# Patient Record
Sex: Female | Born: 1990 | Race: White | Hispanic: No | Marital: Single | Smoking: Former smoker
Health system: Southern US, Community
[De-identification: ages and names within clinical notes are randomized; demographics above are authoritative.]

## PROBLEM LIST (undated history)

## (undated) ENCOUNTER — Ambulatory Visit

## (undated) ENCOUNTER — Encounter

## (undated) ENCOUNTER — Encounter: Attending: Psychiatric/Mental Health | Primary: Psychiatric/Mental Health

## (undated) ENCOUNTER — Telehealth: Payer: PRIVATE HEALTH INSURANCE

## (undated) ENCOUNTER — Telehealth

## (undated) ENCOUNTER — Ambulatory Visit
Payer: PRIVATE HEALTH INSURANCE | Attending: Student in an Organized Health Care Education/Training Program | Primary: Student in an Organized Health Care Education/Training Program

## (undated) ENCOUNTER — Ambulatory Visit: Payer: PRIVATE HEALTH INSURANCE

## (undated) ENCOUNTER — Encounter
Attending: Student in an Organized Health Care Education/Training Program | Primary: Student in an Organized Health Care Education/Training Program

## (undated) ENCOUNTER — Encounter: Payer: PRIVATE HEALTH INSURANCE | Attending: Psychiatric/Mental Health | Primary: Psychiatric/Mental Health

## (undated) ENCOUNTER — Encounter: Attending: School | Primary: School

## (undated) ENCOUNTER — Encounter: Attending: Mental Health | Primary: Mental Health

## (undated) ENCOUNTER — Encounter: Attending: Family | Primary: Family

## (undated) ENCOUNTER — Encounter: Attending: Pulmonary Disease | Primary: Pulmonary Disease

## (undated) ENCOUNTER — Telehealth: Attending: Psychiatric/Mental Health | Primary: Psychiatric/Mental Health

## (undated) ENCOUNTER — Telehealth: Attending: School | Primary: School

## (undated) ENCOUNTER — Encounter: Attending: Internal Medicine | Primary: Internal Medicine

## (undated) ENCOUNTER — Ambulatory Visit: Payer: Medicaid (Managed Care) | Attending: Psychiatric/Mental Health | Primary: Psychiatric/Mental Health

## (undated) ENCOUNTER — Ambulatory Visit: Attending: Pharmacist | Primary: Pharmacist

## (undated) ENCOUNTER — Telehealth: Attending: Internal Medicine | Primary: Internal Medicine

## (undated) ENCOUNTER — Ambulatory Visit: Payer: PRIVATE HEALTH INSURANCE | Attending: Psychiatric/Mental Health | Primary: Psychiatric/Mental Health

## (undated) ENCOUNTER — Telehealth: Attending: Children | Primary: Children

## (undated) ENCOUNTER — Ambulatory Visit: Payer: PRIVATE HEALTH INSURANCE | Attending: Clinical | Primary: Clinical

## (undated) ENCOUNTER — Telehealth: Attending: Mental Health | Primary: Mental Health

## (undated) ENCOUNTER — Ambulatory Visit: Payer: PRIVATE HEALTH INSURANCE | Attending: School | Primary: School

## (undated) ENCOUNTER — Telehealth
Attending: Student in an Organized Health Care Education/Training Program | Primary: Student in an Organized Health Care Education/Training Program

## (undated) ENCOUNTER — Ambulatory Visit: Attending: School | Primary: School

## (undated) ENCOUNTER — Ambulatory Visit: Payer: Medicaid (Managed Care) | Attending: School | Primary: School

## (undated) ENCOUNTER — Encounter: Attending: Clinical | Primary: Clinical

## (undated) ENCOUNTER — Encounter: Payer: Medicaid (Managed Care) | Attending: Psychiatric/Mental Health | Primary: Psychiatric/Mental Health

## (undated) ENCOUNTER — Ambulatory Visit: Payer: PRIVATE HEALTH INSURANCE | Attending: Family | Primary: Family

## (undated) ENCOUNTER — Inpatient Hospital Stay (HOSPITAL_COMMUNITY): Payer: Self-pay

## (undated) DIAGNOSIS — J45909 Unspecified asthma, uncomplicated: Secondary | ICD-10-CM

## (undated) DIAGNOSIS — F419 Anxiety disorder, unspecified: Secondary | ICD-10-CM

## (undated) DIAGNOSIS — M419 Scoliosis, unspecified: Secondary | ICD-10-CM

## (undated) DIAGNOSIS — N200 Calculus of kidney: Secondary | ICD-10-CM

## (undated) DIAGNOSIS — K219 Gastro-esophageal reflux disease without esophagitis: Secondary | ICD-10-CM

## (undated) DIAGNOSIS — Z62810 Personal history of physical and sexual abuse in childhood: Secondary | ICD-10-CM

## (undated) DIAGNOSIS — F329 Major depressive disorder, single episode, unspecified: Secondary | ICD-10-CM

## (undated) DIAGNOSIS — Z8619 Personal history of other infectious and parasitic diseases: Secondary | ICD-10-CM

## (undated) DIAGNOSIS — F32A Depression, unspecified: Secondary | ICD-10-CM

## (undated) DIAGNOSIS — F988 Other specified behavioral and emotional disorders with onset usually occurring in childhood and adolescence: Secondary | ICD-10-CM

## (undated) HISTORY — DX: Anxiety disorder, unspecified: F41.9

## (undated) HISTORY — DX: Depression, unspecified: F32.A

## (undated) HISTORY — PX: ADENOIDECTOMY: SUR15

## (undated) HISTORY — DX: Personal history of other infectious and parasitic diseases: Z86.19

## (undated) HISTORY — DX: Other specified behavioral and emotional disorders with onset usually occurring in childhood and adolescence: F98.8

## (undated) HISTORY — DX: Major depressive disorder, single episode, unspecified: F32.9

## (undated) HISTORY — DX: Gastro-esophageal reflux disease without esophagitis: K21.9

## (undated) HISTORY — DX: Personal history of physical and sexual abuse in childhood: Z62.810

## (undated) MED ORDER — FLUTICASONE PROPIONATE 220 MCG/ACTUATION HFA AEROSOL INHALER: Freq: Once | RESPIRATORY_TRACT | 0 days | PRN

## (undated) MED ORDER — PREDNISONE 10 MG TABLET: 0 days

---
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
## 2010-06-05 ENCOUNTER — Emergency Department (HOSPITAL_COMMUNITY)
Admission: EM | Admit: 2010-06-05 | Discharge: 2010-06-05 | Disposition: A | Discharge status: Home / Self Care | Payer: Self-pay | Attending: Emergency Medicine | Admitting: Emergency Medicine

## 2010-06-05 DIAGNOSIS — F3289 Other specified depressive episodes: Secondary | ICD-10-CM | POA: Insufficient documentation

## 2010-06-05 DIAGNOSIS — F909 Attention-deficit hyperactivity disorder, unspecified type: Secondary | ICD-10-CM | POA: Insufficient documentation

## 2010-06-05 DIAGNOSIS — R110 Nausea: Secondary | ICD-10-CM | POA: Insufficient documentation

## 2010-06-05 DIAGNOSIS — J45909 Unspecified asthma, uncomplicated: Secondary | ICD-10-CM | POA: Insufficient documentation

## 2010-06-05 DIAGNOSIS — T43605A Adverse effect of unspecified psychostimulants, initial encounter: Secondary | ICD-10-CM | POA: Insufficient documentation

## 2010-06-05 DIAGNOSIS — F329 Major depressive disorder, single episode, unspecified: Secondary | ICD-10-CM | POA: Insufficient documentation

## 2010-06-05 DIAGNOSIS — F29 Unspecified psychosis not due to a substance or known physiological condition: Secondary | ICD-10-CM | POA: Insufficient documentation

## 2010-06-05 DIAGNOSIS — R42 Dizziness and giddiness: Secondary | ICD-10-CM | POA: Insufficient documentation

## 2010-06-05 DIAGNOSIS — F411 Generalized anxiety disorder: Secondary | ICD-10-CM | POA: Insufficient documentation

## 2011-11-07 ENCOUNTER — Emergency Department: Payer: Self-pay | Admitting: Emergency Medicine

## 2012-03-31 ENCOUNTER — Encounter: Payer: Self-pay | Admitting: Obstetrics and Gynecology

## 2012-05-31 ENCOUNTER — Encounter (HOSPITAL_COMMUNITY): Payer: Self-pay | Admitting: *Deleted

## 2012-05-31 ENCOUNTER — Inpatient Hospital Stay (HOSPITAL_COMMUNITY)
Admission: AD | Admit: 2012-05-31 | Discharge: 2012-05-31 | Disposition: A | Discharge status: Home / Self Care | Payer: BC Managed Care – PPO | Source: Ambulatory Visit | Attending: Obstetrics & Gynecology | Admitting: Obstetrics & Gynecology

## 2012-05-31 DIAGNOSIS — O2390 Unspecified genitourinary tract infection in pregnancy, unspecified trimester: Secondary | ICD-10-CM

## 2012-05-31 DIAGNOSIS — O2343 Unspecified infection of urinary tract in pregnancy, third trimester: Secondary | ICD-10-CM

## 2012-05-31 DIAGNOSIS — N390 Urinary tract infection, site not specified: Secondary | ICD-10-CM | POA: Insufficient documentation

## 2012-05-31 DIAGNOSIS — R1031 Right lower quadrant pain: Secondary | ICD-10-CM | POA: Insufficient documentation

## 2012-05-31 HISTORY — DX: Scoliosis, unspecified: M41.9

## 2012-05-31 LAB — URINE MICROSCOPIC-ADD ON

## 2012-05-31 LAB — CBC WITH DIFFERENTIAL/PLATELET
Basophils Relative: 0 % (ref 0–1)
Eosinophils Relative: 4 % (ref 0–5)
HCT: 33.1 % — ABNORMAL LOW (ref 36.0–46.0)
Hemoglobin: 11.3 g/dL — ABNORMAL LOW (ref 12.0–15.0)
MCH: 31.2 pg (ref 26.0–34.0)
MCHC: 34.1 g/dL (ref 30.0–36.0)
MCV: 91.4 fL (ref 78.0–100.0)
Monocytes Absolute: 0.6 10*3/uL (ref 0.1–1.0)
Monocytes Relative: 6 % (ref 3–12)
Neutro Abs: 6.8 10*3/uL (ref 1.7–7.7)

## 2012-05-31 LAB — URINALYSIS, ROUTINE W REFLEX MICROSCOPIC
Ketones, ur: NEGATIVE mg/dL
Leukocytes, UA: NEGATIVE
Nitrite: POSITIVE — AB
Protein, ur: NEGATIVE mg/dL
Urobilinogen, UA: 0.2 mg/dL (ref 0.0–1.0)

## 2012-05-31 MED ORDER — MORPHINE SULFATE 4 MG/ML IJ SOLN
2.0000 mg | Freq: Once | INTRAMUSCULAR | Status: AC
  Administered 2012-05-31: 2 mg via INTRAMUSCULAR
  Filled 2012-05-31: qty 1

## 2012-05-31 MED ORDER — NITROFURANTOIN MONOHYD MACRO 100 MG PO CAPS: 100.0000 mg | ORAL_CAPSULE | Freq: Two times a day (BID) | ORAL | Status: DC

## 2012-05-31 NOTE — MAU Note (Signed)
PT presents with complaints of pain in her lower right side stating that it started this morning when she got out of bed. Denies any bleeding or LOF. States baby is active.

## 2012-05-31 NOTE — MAU Provider Note (Signed)
History     CSN: [id number]  Arrival date and time: 05/31/12 0865   None     Chief Complaint  Patient presents with  . pain right side    HPI Betty Webb is a 22 y.o. G1P0 female at [redacted]w[redacted]d who presents w/ report of RLQ pain that began this am when she got out of bed- states the pain brought her to her knees.  Reports pain as sharp and crampy. Pain worse with movement, raising legs, walking. Pain radiates down rt leg and into Rt lower back. Denies fever/chills, vomiting.  States the pain is making her nauseated.  Reports increased urinary frequency, dysuria. Denies urgency, hesitancy, hematuria. Last bm yesterday. Reports good fm. Denies uc's, lof, or vb. Concerned it could also be ovarian cyst- she has had in past.  Had anatomy u/s app 4wks ago and was normal.  [facility] in [place] for care but wants to deliver here- discussed need for either switching providers or delivering at [facility].  OB History   Grav Para Term Preterm Abortions TAB SAB Ect Mult Living   1               Past Medical History  Diagnosis Date  . Scoliosis     History reviewed. No pertinent past surgical history.  History reviewed. No pertinent family history.  History  Substance Use Topics  . Smoking status: Never Smoker   . Smokeless tobacco: Not on file  . Alcohol Use: No    Allergies: No Known Allergies  Prescriptions prior to admission  Medication Sig Dispense Refill  . acetaminophen (TYLENOL) 325 MG tablet Take 650 mg by mouth every 6 (six) hours as needed for pain (pain).      Marland Kitchen diphenhydrAMINE (BENADRYL) 25 mg capsule Take 25 mg by mouth every 6 (six) hours as needed for itching (allergies).        Review of Systems  Constitutional: Negative.  Negative for fever and chills.  Eyes: Negative.   Respiratory: Negative.   Cardiovascular: Negative.   Gastrointestinal: Positive for nausea (d/t pain) and abdominal pain (RLQ). Negative for vomiting, diarrhea and constipation.   Genitourinary: Positive for dysuria and frequency. Negative for urgency and hematuria. Flank pain: RLQ pain radiates to Rt lower back.  Musculoskeletal: Negative.   Skin: Negative.   Neurological: Positive for headaches.  Endo/Heme/Allergies: Negative.   Psychiatric/Behavioral: Negative.    Physical Exam   Blood pressure 113/81, pulse 85, temperature 97.6 F (36.4 C), temperature source Oral, resp. rate 18, last menstrual period 12/14/2011.  Physical Exam  Constitutional: She is oriented to person, place, and time. She appears well-developed and well-nourished.  HENT:  Head: Normocephalic.  Neck: Normal range of motion.  Cardiovascular: Normal rate and regular rhythm.   Respiratory: Effort normal and breath sounds normal.  GI: Soft. She exhibits no distension. There is tenderness (RLQ>suprapubic) in the right lower quadrant and suprapubic area. There is no rebound and no CVA tenderness.  gravid  Genitourinary:  SVE: LTC high  Musculoskeletal: Normal range of motion.  Neurological: She is alert and oriented to person, place, and time.  Skin: Skin is warm and dry.  Psychiatric: She has a normal mood and affect. Her behavior is normal. Judgment and thought content normal.    MAU Course  Procedures  EFM Exam UA, C&S CBC w/ diff Morphine 2mg  IM x 1  Discussed w/ Dr. Debroah Loop- OK to d/c home  Results for orders placed during the hospital encounter of 05/31/12 (from  the past 48 hour(s))  URINALYSIS, ROUTINE W REFLEX MICROSCOPIC     Status: Abnormal   Collection Time    05/31/12  8:10 AM      Result Value Range   Color, Urine YELLOW  YELLOW   APPearance CLEAR  CLEAR   Specific Gravity, Urine 1.020  1.005 - 1.030   pH 6.0  5.0 - 8.0   Glucose, UA NEGATIVE  NEGATIVE mg/dL   Hgb urine dipstick NEGATIVE  NEGATIVE   Bilirubin Urine NEGATIVE  NEGATIVE   Ketones, ur NEGATIVE  NEGATIVE mg/dL   Protein, ur NEGATIVE  NEGATIVE mg/dL   Urobilinogen, UA 0.2  0.0 - 1.0 mg/dL    Nitrite POSITIVE (*) NEGATIVE   Leukocytes, UA NEGATIVE  NEGATIVE  URINE MICROSCOPIC-ADD ON     Status: Abnormal   Collection Time    05/31/12  8:10 AM      Result Value Range   Squamous Epithelial / LPF MANY (*) RARE   WBC, UA 0-2  <3 WBC/hpf   Bacteria, UA MANY (*) RARE   Urine-Other MUCOUS PRESENT    CBC WITH DIFFERENTIAL     Status: Abnormal   Collection Time    05/31/12  9:19 AM      Result Value Range   WBC 9.5  4.0 - 10.5 K/uL   RBC 3.62 (*) 3.87 - 5.11 MIL/uL   Hemoglobin 11.3 (*) 12.0 - 15.0 g/dL   HCT 45.4 (*) 09.8 - 11.9 %   MCV 91.4  78.0 - 100.0 fL   MCH 31.2  26.0 - 34.0 pg   MCHC 34.1  30.0 - 36.0 g/dL   RDW 14.7  82.9 - 56.2 %   Platelets 223  150 - 400 K/uL   Neutrophils Relative 72  43 - 77 %   Neutro Abs 6.8  1.7 - 7.7 K/uL   Lymphocytes Relative 18  12 - 46 %   Lymphs Abs 1.7  0.7 - 4.0 K/uL   Monocytes Relative 6  3 - 12 %   Monocytes Absolute 0.6  0.1 - 1.0 K/uL   Eosinophils Relative 4  0 - 5 %   Eosinophils Absolute 0.3  0.0 - 0.7 K/uL   Basophils Relative 0  0 - 1 %   Basophils Absolute 0.0  0.0 - 0.1 K/uL   Assessment and Plan  A:  [redacted]w[redacted]d SIUP  UTI  Cat I FHR   P:  D/C home  Macrobid 100mg  po bid x 7d  Keep next pnv as scheduled- switch providers if planning on delivering here  List of providers given to pt  Return to hospital if symptoms worsen  Marge Duncans 05/31/2012, 8:55 AM

## 2012-05-31 NOTE — Progress Notes (Signed)
Genella Rife CNM notified of pt's arrival and complaints. States she will come and evaluate pt.

## 2012-06-23 ENCOUNTER — Encounter: Payer: Self-pay | Admitting: Obstetrics and Gynecology

## 2012-07-08 ENCOUNTER — Encounter: Payer: Self-pay | Admitting: Pediatric Cardiology

## 2012-08-12 ENCOUNTER — Encounter: Payer: Self-pay | Admitting: Pediatrics

## 2012-09-19 ENCOUNTER — Encounter (HOSPITAL_COMMUNITY): Payer: Self-pay | Admitting: *Deleted

## 2012-09-19 ENCOUNTER — Inpatient Hospital Stay (HOSPITAL_COMMUNITY)
Admission: AD | Admit: 2012-09-19 | Discharge: 2012-09-19 | Disposition: A | Discharge status: Home / Self Care | Payer: BC Managed Care – PPO | Source: Ambulatory Visit | Attending: Obstetrics and Gynecology | Admitting: Obstetrics and Gynecology

## 2012-09-19 DIAGNOSIS — F329 Major depressive disorder, single episode, unspecified: Secondary | ICD-10-CM | POA: Diagnosis not present

## 2012-09-19 DIAGNOSIS — O479 False labor, unspecified: Secondary | ICD-10-CM | POA: Insufficient documentation

## 2012-09-19 DIAGNOSIS — J45909 Unspecified asthma, uncomplicated: Secondary | ICD-10-CM | POA: Diagnosis not present

## 2012-09-19 DIAGNOSIS — O99891 Other specified diseases and conditions complicating pregnancy: Secondary | ICD-10-CM | POA: Insufficient documentation

## 2012-09-19 DIAGNOSIS — F419 Anxiety disorder, unspecified: Secondary | ICD-10-CM | POA: Diagnosis not present

## 2012-09-19 DIAGNOSIS — N200 Calculus of kidney: Secondary | ICD-10-CM | POA: Diagnosis not present

## 2012-09-19 HISTORY — DX: Unspecified asthma, uncomplicated: J45.909

## 2012-09-19 HISTORY — DX: Calculus of kidney: N20.0

## 2012-09-19 NOTE — MAU Provider Note (Signed)
History   22 yo G1P0 at 40 weeks presented after calling the office c/o ? Leaking for 1-2 days and irregular contractions.  Denies bleeding, reports +FM.  Cervix was 2 cm on exam this week.  BP in office 120/80.  Patient Active Problem List   Diagnosis Date Noted  . Kidney stone complicating pregnancy 09/19/2012  . Asthma--mild 09/19/2012  . Anxiety and depression 09/19/2012     Chief Complaint  Patient presents with  . Labor Eval  . Rupture of Membranes     OB History   Grav Para Term Preterm Abortions TAB SAB Ect Mult Living   1               Past Medical History  Diagnosis Date  . Scoliosis   . Asthma   . Kidney stone     Past Surgical History  Procedure Laterality Date  . Adenoidectomy      History reviewed. No pertinent family history.  History  Substance Use Topics  . Smoking status: Never Smoker   . Smokeless tobacco: Not on file  . Alcohol Use: No    Allergies: No Known Allergies  Prescriptions prior to admission  Medication Sig Dispense Refill  . alum & mag hydroxide-simeth (GELUSIL) 200-200-25 MG suspension Chew 2 tablets by mouth 2 (two) times daily as needed for indigestion.      . diphenhydrAMINE (BENADRYL) 25 mg capsule Take 25 mg by mouth every 6 (six) hours as needed for itching (allergies).      . Evening Primrose Oil 1000 MG CAPS Take 3 capsules by mouth daily. To help induce labor, soften cervix.      . hydrocortisone cream 1 % Apply 1 application topically 2 (two) times daily as needed (For rash.).         Physical Exam   Blood pressure 129/86, pulse 109, temperature 98.7 F (37.1 C), temperature source Oral, resp. rate 20, last menstrual period 12/14/2011.  Chest clear  Heart RRR without murmur Abd gravid, NT Pelvic--cervix 1-2 cm, 50%, vtx, -2, no leaking noted. Ext WNL  FHR Category 1 Occasional, mild UCs  Results for orders placed during the hospital encounter of 09/19/12 (from the past 24 hour(s))  AMNISURE RUPTURE OF  MEMBRANE (ROM)     Status: None   Collection Time    09/19/12  5:15 PM      Result Value Range   Amnisure ROM NEGATIVE       ED Course  IUP at 40 weeks No evidence SROM Will d/c home--labor s/s reviewed. PIH precautions reviewed. Keep scheduled office visit on Monday or call prn.   Nigel Bridgeman CNM, [place] 09/19/2012 5:55 PM

## 2012-09-19 NOTE — MAU Note (Signed)
uc's since last night, ? Leaking since yesterday, clear fluid.  Denies bleeding.  Also mucus discharge today.

## 2012-09-24 ENCOUNTER — Encounter (HOSPITAL_COMMUNITY): Payer: Self-pay | Admitting: *Deleted

## 2012-09-24 ENCOUNTER — Telehealth (HOSPITAL_COMMUNITY): Payer: Self-pay | Admitting: *Deleted

## 2012-09-24 NOTE — Telephone Encounter (Signed)
Preadmission screen  

## 2012-09-25 ENCOUNTER — Inpatient Hospital Stay (HOSPITAL_COMMUNITY): Payer: BC Managed Care – PPO | Admitting: Anesthesiology

## 2012-09-25 ENCOUNTER — Inpatient Hospital Stay (HOSPITAL_COMMUNITY)
Admission: RE | Admit: 2012-09-25 | Discharge: 2012-09-29 | DRG: 370 | Disposition: A | Discharge status: Home / Self Care | Payer: BC Managed Care – PPO | Source: Ambulatory Visit | Attending: Obstetrics and Gynecology | Admitting: Obstetrics and Gynecology

## 2012-09-25 ENCOUNTER — Encounter (HOSPITAL_COMMUNITY): Payer: Self-pay

## 2012-09-25 ENCOUNTER — Encounter (HOSPITAL_COMMUNITY): Payer: Self-pay | Admitting: Anesthesiology

## 2012-09-25 VITALS — BP 128/56 | HR 104 | Temp 98.7°F | Resp 19 | Ht 67.0 in | Wt 232.0 lb

## 2012-09-25 DIAGNOSIS — D649 Anemia, unspecified: Secondary | ICD-10-CM | POA: Diagnosis not present

## 2012-09-25 DIAGNOSIS — F419 Anxiety disorder, unspecified: Secondary | ICD-10-CM | POA: Diagnosis present

## 2012-09-25 DIAGNOSIS — O324XX0 Maternal care for high head at term, not applicable or unspecified: Secondary | ICD-10-CM | POA: Diagnosis present

## 2012-09-25 DIAGNOSIS — O359XX0 Maternal care for (suspected) fetal abnormality and damage, unspecified, not applicable or unspecified: Secondary | ICD-10-CM | POA: Diagnosis present

## 2012-09-25 DIAGNOSIS — O26899 Other specified pregnancy related conditions, unspecified trimester: Secondary | ICD-10-CM | POA: Diagnosis present

## 2012-09-25 DIAGNOSIS — O26849 Uterine size-date discrepancy, unspecified trimester: Secondary | ICD-10-CM | POA: Insufficient documentation

## 2012-09-25 DIAGNOSIS — O9903 Anemia complicating the puerperium: Secondary | ICD-10-CM | POA: Diagnosis not present

## 2012-09-25 DIAGNOSIS — O480 Post-term pregnancy: Secondary | ICD-10-CM | POA: Diagnosis present

## 2012-09-25 DIAGNOSIS — O358XX0 Maternal care for other (suspected) fetal abnormality and damage, not applicable or unspecified: Secondary | ICD-10-CM | POA: Diagnosis present

## 2012-09-25 DIAGNOSIS — Z98891 History of uterine scar from previous surgery: Secondary | ICD-10-CM

## 2012-09-25 DIAGNOSIS — R21 Rash and other nonspecific skin eruption: Secondary | ICD-10-CM | POA: Diagnosis present

## 2012-09-25 DIAGNOSIS — L299 Pruritus, unspecified: Secondary | ICD-10-CM | POA: Diagnosis present

## 2012-09-25 LAB — OB RESULTS CONSOLE GC/CHLAMYDIA
Chlamydia: NEGATIVE
Gonorrhea: NEGATIVE

## 2012-09-25 LAB — COMPREHENSIVE METABOLIC PANEL
ALT: 11 U/L (ref 0–35)
Albumin: 2.7 g/dL — ABNORMAL LOW (ref 3.5–5.2)
Alkaline Phosphatase: 178 U/L — ABNORMAL HIGH (ref 39–117)
Potassium: 4.2 mEq/L (ref 3.5–5.1)
Sodium: 135 mEq/L (ref 135–145)
Total Protein: 6.4 g/dL (ref 6.0–8.3)

## 2012-09-25 LAB — PROTEIN / CREATININE RATIO, URINE
Protein Creatinine Ratio: 0.21 — ABNORMAL HIGH (ref 0.00–0.15)
Total Protein, Urine: 69.8 mg/dL

## 2012-09-25 LAB — CBC
HCT: 33 % — ABNORMAL LOW (ref 36.0–46.0)
Platelets: 254 10*3/uL (ref 150–400)
RBC: 3.92 MIL/uL (ref 3.87–5.11)
RDW: 14 % (ref 11.5–15.5)
WBC: 9.2 10*3/uL (ref 4.0–10.5)

## 2012-09-25 LAB — OB RESULTS CONSOLE ABO/RH: "RH Type ": POSITIVE

## 2012-09-25 LAB — ABO/RH: ABO/RH(D): A POS

## 2012-09-25 LAB — OB RESULTS CONSOLE ANTIBODY SCREEN: Antibody Screen: NEGATIVE

## 2012-09-25 LAB — TYPE AND SCREEN: ABO/RH(D): A POS

## 2012-09-25 MED ORDER — TERBUTALINE SULFATE 1 MG/ML IJ SOLN: 0.2500 mg | Freq: Once | INTRAMUSCULAR | Status: AC | PRN

## 2012-09-25 MED ORDER — LIDOCAINE HCL (PF) 1 % IJ SOLN
30.0000 mL | INTRAMUSCULAR | Status: DC | PRN
  Filled 2012-09-25: qty 30

## 2012-09-25 MED ORDER — OXYTOCIN 40 UNITS IN LACTATED RINGERS INFUSION - SIMPLE MED
1.0000 m[IU]/min | INTRAVENOUS | Status: DC
  Administered 2012-09-25: 2 m[IU]/min via INTRAVENOUS

## 2012-09-25 MED ORDER — ACETAMINOPHEN 325 MG PO TABS: 650.0000 mg | ORAL_TABLET | ORAL | Status: DC | PRN

## 2012-09-25 MED ORDER — FENTANYL 2.5 MCG/ML BUPIVACAINE 1/10 % EPIDURAL INFUSION (WH - ANES)
INTRAMUSCULAR | Status: DC | PRN
  Administered 2012-09-25: 14 mL/h via EPIDURAL

## 2012-09-25 MED ORDER — LIDOCAINE HCL (PF) 1 % IJ SOLN
INTRAMUSCULAR | Status: DC | PRN
  Administered 2012-09-25 (×2): 4 mL

## 2012-09-25 MED ORDER — IBUPROFEN 600 MG PO TABS: 600.0000 mg | ORAL_TABLET | Freq: Four times a day (QID) | ORAL | Status: DC | PRN

## 2012-09-25 MED ORDER — LACTATED RINGERS IV SOLN: 500.0000 mL | INTRAVENOUS | Status: DC | PRN

## 2012-09-25 MED ORDER — EPHEDRINE 5 MG/ML INJ
10.0000 mg | INTRAVENOUS | Status: DC | PRN
  Filled 2012-09-25: qty 4

## 2012-09-25 MED ORDER — OXYCODONE-ACETAMINOPHEN 5-325 MG PO TABS: 1.0000 | ORAL_TABLET | ORAL | Status: DC | PRN

## 2012-09-25 MED ORDER — FENTANYL CITRATE 0.05 MG/ML IJ SOLN
100.0000 ug | INTRAMUSCULAR | Status: DC | PRN
  Administered 2012-09-25 (×4): 100 ug via INTRAVENOUS
  Filled 2012-09-25 (×4): qty 2

## 2012-09-25 MED ORDER — PHENYLEPHRINE 40 MCG/ML (10ML) SYRINGE FOR IV PUSH (FOR BLOOD PRESSURE SUPPORT): 80.0000 ug | PREFILLED_SYRINGE | INTRAVENOUS | Status: DC | PRN

## 2012-09-25 MED ORDER — CITRIC ACID-SODIUM CITRATE 334-500 MG/5ML PO SOLN
30.0000 mL | ORAL | Status: DC | PRN
  Administered 2012-09-26: 30 mL via ORAL
  Filled 2012-09-25: qty 15

## 2012-09-25 MED ORDER — FENTANYL 2.5 MCG/ML BUPIVACAINE 1/10 % EPIDURAL INFUSION (WH - ANES)
14.0000 mL/h | INTRAMUSCULAR | Status: DC | PRN
  Administered 2012-09-26: 14 mL/h via EPIDURAL
  Filled 2012-09-25 (×2): qty 125

## 2012-09-25 MED ORDER — HYDROXYZINE HCL 25 MG PO TABS
25.0000 mg | ORAL_TABLET | Freq: Three times a day (TID) | ORAL | Status: DC | PRN
  Filled 2012-09-25: qty 1

## 2012-09-25 MED ORDER — PHENYLEPHRINE 40 MCG/ML (10ML) SYRINGE FOR IV PUSH (FOR BLOOD PRESSURE SUPPORT)
80.0000 ug | PREFILLED_SYRINGE | INTRAVENOUS | Status: DC | PRN
  Filled 2012-09-25: qty 5

## 2012-09-25 MED ORDER — LACTATED RINGERS IV SOLN
500.0000 mL | Freq: Once | INTRAVENOUS | Status: AC
  Administered 2012-09-25: 500 mL via INTRAVENOUS

## 2012-09-25 MED ORDER — OXYTOCIN 40 UNITS IN LACTATED RINGERS INFUSION - SIMPLE MED
62.5000 mL/h | INTRAVENOUS | Status: DC
  Filled 2012-09-25: qty 1000

## 2012-09-25 MED ORDER — DIPHENHYDRAMINE HCL 50 MG/ML IJ SOLN: 12.5000 mg | INTRAMUSCULAR | Status: DC | PRN

## 2012-09-25 MED ORDER — ONDANSETRON HCL 4 MG/2ML IJ SOLN
4.0000 mg | Freq: Four times a day (QID) | INTRAMUSCULAR | Status: DC | PRN
  Administered 2012-09-26: 4 mg via INTRAVENOUS
  Filled 2012-09-25: qty 2

## 2012-09-25 MED ORDER — EPHEDRINE 5 MG/ML INJ: 10.0000 mg | INTRAVENOUS | Status: DC | PRN

## 2012-09-25 MED ORDER — OXYTOCIN BOLUS FROM INFUSION: 500.0000 mL | INTRAVENOUS | Status: DC

## 2012-09-25 MED ORDER — DIPHENHYDRAMINE-ZINC ACETATE 2-0.1 % EX CREA
TOPICAL_CREAM | Freq: Every day | CUTANEOUS | Status: DC | PRN
  Administered 2012-09-25: 10:00:00 via TOPICAL
  Filled 2012-09-25: qty 28

## 2012-09-25 MED ORDER — LACTATED RINGERS IV SOLN
INTRAVENOUS | Status: DC
  Administered 2012-09-25 – 2012-09-26 (×4): via INTRAVENOUS

## 2012-09-25 NOTE — Anesthesia Procedure Notes (Signed)
Epidural Patient location during procedure: OB Start time: 09/25/2012 7:48 PM  Staffing Anesthesiologist: Richar Dunklee A. Performed by: anesthesiologist   Preanesthetic Checklist Completed: patient identified, site marked, surgical consent, pre-op evaluation, timeout performed, IV checked, risks and benefits discussed and monitors and equipment checked  Epidural Patient position: sitting Prep: site prepped and draped and DuraPrep Patient monitoring: continuous pulse ox and blood pressure Approach: midline Injection technique: LOR air  Needle:  Needle type: Tuohy  Needle gauge: 17 G Needle length: 9 cm and 9 Needle insertion depth: 5 cm cm Catheter type: closed end flexible Catheter size: 19 Gauge Catheter at skin depth: 10 cm Test dose: negative and Other  Assessment Events: blood not aspirated, injection not painful, no injection resistance, negative IV test and no paresthesia  Additional Notes Patient identified. Risks and benefits discussed including failed block, incomplete  Pain control, post dural puncture headache, nerve damage, paralysis, blood pressure Changes, nausea, vomiting, reactions to medications-both toxic and allergic and post Partum back pain. All questions were answered. Patient expressed understanding and wished to proceed. Sterile technique was used throughout procedure. Epidural site was Dressed with sterile barrier dressing. No paresthesias, signs of intravascular injection Or signs of intrathecal spread were encountered.  Patient was more comfortable after the epidural was dosed. Please see RN's note for documentation of vital signs and FHR which are stable.

## 2012-09-25 NOTE — Progress Notes (Signed)
  Subjective: Pt sitting up in bed with friends and family at bedside. Pt reports increased intensity of UCs.  Pt declined Vistaril and asked for IV pain medication for the UCs instead. Pt was given Fentanyl instead.  She is hoping to wait as long as possible before getting an epidural.  Objective: BP 127/75  Pulse 89  Temp(Src) 97.8 F (36.6 C) (Oral)  Resp 22  Ht 5\' 7"  (1.702 m)  Wt 232 lb (105.235 kg)  BMI 36.33 kg/m2  LMP 12/14/2011      FHT:  Cat I UC:   regular, every 1-4 minutes  SVE:   Dilation: 3 Effacement (%): 70 Station: -2 Exam by:: J Dazhane Villagomez CNM  Assessment / Plan:  Labor: IOL for PDs, Pitocin on 12 milliU Preeclampsia: no s/s; 1x elevated BP will cto. Fetal Wellbeing: Cat I Pain Control: Fentanyl 100 mcg Q 1 hr I/D: GBS neg; intact; afibril Anticipated MOD: SVD   Betty Webb 09/25/2012, 3:17 PM

## 2012-09-25 NOTE — Progress Notes (Signed)
  Subjective: Pt is c/o intense itching from abdominal rash, may be PUPPS.  Pt is reporting feeling UCs.  Objective: BP 131/95  Pulse 89  Temp(Src) 97.8 F (36.6 C) (Oral)  Resp 17  Ht 5\' 7"  (1.702 m)  Wt 232 lb (105.235 kg)  BMI 36.33 kg/m2  LMP 12/14/2011      FHT:  Cat I UC:   regular, every 1-4 minutes   Assessment / Plan:  Vistaril 25 mg TID PRN for ithcing ordred  Danessa Mensch 09/25/2012, 1:00 PM

## 2012-09-25 NOTE — Anesthesia Preprocedure Evaluation (Addendum)
Anesthesia Evaluation    Airway Mallampati: III TM Distance: >3 FB Neck ROM: Full    Dental no notable dental hx. (+) Teeth Intact   Pulmonary asthma , former smoker,  breath sounds clear to auscultation  Pulmonary exam normal       Cardiovascular negative cardio ROS  Rhythm:Regular Rate:Normal     Neuro/Psych PSYCHIATRIC DISORDERS Anxiety Depression    GI/Hepatic Neg liver ROS, GERD-  Medicated and Controlled,  Endo/Other  Obesity  Renal/GU Renal diseaseHx/o Renal Calculus during pregnancy  negative genitourinary   Musculoskeletal   Abdominal (+) + obese,   Peds  Hematology negative hematology ROS (+)   Anesthesia Other Findings   Reproductive/Obstetrics                           Anesthesia Physical Anesthesia Plan  ASA: II and emergent  Anesthesia Plan: Epidural   Post-op Pain Management:    Induction:   Airway Management Planned: Natural Airway  Additional Equipment:   Intra-op Plan:   Post-operative Plan:   Informed Consent: I have reviewed the patients History and Physical, chart, labs and discussed the procedure including the risks, benefits and alternatives for the proposed anesthesia with the patient or authorized representative who has indicated his/her understanding and acceptance.   Dental advisory given  Plan Discussed with: Anesthesiologist  Anesthesia Plan Comments:        Anesthesia Quick Evaluation

## 2012-09-25 NOTE — Progress Notes (Addendum)
Patient ID: Betty Webb, female   DOB: 08-20-1990, 22 y.o.   MRN: [id number] Betty Webb is a 22 y.o. G1P0000 at [redacted]w[redacted]d admitted for IOL for PD  Subjective: Comfortable w epidural, somewhat anxious overall, family supportive at [facility]  Objective: BP 134/89  Pulse 92  Temp(Src) 98.2 F (36.8 C) (Oral)  Resp 18  Ht 5\' 7"  (1.702 m)  Wt 232 lb (105.235 kg)  BMI 36.33 kg/m2  SpO2 99%  LMP 12/14/2011     FHT:  FHR: 120 bpm, variability: moderate,  accelerations:  Present,  decelerations:  Absent UC:   regular, every 1-2  minutes SVE:   Dilation: 8 Effacement (%): 100 Station: 0 Exam by:: lilliard,cnm    Assessment / Plan: Induction of labor due to postterm,  progressing well on pitocin  Labor: Progressing normally Preeclampsia:  no s/s Fetal Wellbeing:  Category I Pain Control:  Epidural Anticipated MOD:  NSVD  Recheck 2hrs or w urge to push  Per pt, did have glucola and results were normal  Update physician PRN   Malissa Hippo 09/25/2012, 24:19 PM

## 2012-09-25 NOTE — Progress Notes (Signed)
Patient ID: Betty Webb, female   DOB: 04-May-1990, 22 y.o.   MRN: [id number] Betty Webb is a 22 y.o. G1P0000 at [redacted]w[redacted]d admitted for IOL for PD  Subjective: Comfortable w epidural, family supportive at [facility]  Objective: BP 132/84  Pulse 89  Temp(Src) 98.2 F (36.8 C) (Oral)  Resp 18  Ht 5\' 7"  (1.702 m)  Wt 232 lb (105.235 kg)  BMI 36.33 kg/m2  SpO2 99%  LMP 12/14/2011     Filed Vitals:   09/25/12 2023 09/25/12 2033 09/25/12 2102 09/25/12 2132  BP:  136/78 135/87 132/84  Pulse: 87 93 83 89  Temp:  98.2 F (36.8 C)    TempSrc:  Oral    Resp:  20  18  Height:      Weight:      SpO2:         FHT:  FHR: 130 bpm, variability: moderate,  accelerations:  Present,  decelerations:  Absent UC:   regular, every 1-3 minutes SVE:   Dilation: 4.5 Effacement (%): 90 Station: -1 Exam by:: foley,rn  SROM clear fluid w exam by RN at about 815pm Vag deferred at present   Assessment / Plan: Induction of labor due to postterm,  progressing well on pitocin  Labor: Progressing normally, pitocin at 19mu, continue titration prn  Preeclampsia:  BP stable Fetal Wellbeing:  Category I Pain Control:  Epidural Anticipated MOD:  NSVD  Unknown result for 1hr gtt, will confirm w pt, if not done, consider checking CBG's  GBS neg Recheck 1hr, consider IUPC  Dr Pennie Rushing updated   Betty Webb 09/25/2012, 9:57 PM
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
## 2012-09-26 ENCOUNTER — Encounter (HOSPITAL_COMMUNITY)
Admission: RE | Disposition: A | Discharge status: Home / Self Care | Payer: Self-pay | Source: Ambulatory Visit | Attending: Obstetrics and Gynecology

## 2012-09-26 ENCOUNTER — Encounter (HOSPITAL_COMMUNITY): Payer: Self-pay

## 2012-09-26 SURGERY — Surgical Case
Anesthesia: Epidural | Wound class: Clean Contaminated

## 2012-09-26 MED ORDER — METHYLERGONOVINE MALEATE 0.2 MG/ML IJ SOLN: 0.2000 mg | INTRAMUSCULAR | Status: DC | PRN

## 2012-09-26 MED ORDER — SENNOSIDES-DOCUSATE SODIUM 8.6-50 MG PO TABS
2.0000 | ORAL_TABLET | Freq: Every day | ORAL | Status: DC
  Administered 2012-09-26 – 2012-09-28 (×3): 2 via ORAL

## 2012-09-26 MED ORDER — SODIUM BICARBONATE 8.4 % IV SOLN
INTRAVENOUS | Status: DC | PRN
  Administered 2012-09-26 (×5): 5 mL via EPIDURAL

## 2012-09-26 MED ORDER — MEPERIDINE HCL 25 MG/ML IJ SOLN
INTRAMUSCULAR | Status: AC
  Filled 2012-09-26: qty 1

## 2012-09-26 MED ORDER — ONDANSETRON HCL 4 MG/2ML IJ SOLN: 4.0000 mg | INTRAMUSCULAR | Status: DC | PRN

## 2012-09-26 MED ORDER — METHYLERGONOVINE MALEATE 0.2 MG PO TABS: 0.2000 mg | ORAL_TABLET | ORAL | Status: DC | PRN

## 2012-09-26 MED ORDER — MEASLES, MUMPS & RUBELLA VAC ~~LOC~~ INJ
0.5000 mL | INJECTION | Freq: Once | SUBCUTANEOUS | Status: DC
  Filled 2012-09-26: qty 0.5

## 2012-09-26 MED ORDER — 0.9 % SODIUM CHLORIDE (POUR BTL) OPTIME
TOPICAL | Status: DC | PRN
  Administered 2012-09-26: 1000 mL

## 2012-09-26 MED ORDER — OXYTOCIN 40 UNITS IN LACTATED RINGERS INFUSION - SIMPLE MED: 62.5000 mL/h | INTRAVENOUS | Status: AC

## 2012-09-26 MED ORDER — MEPERIDINE HCL 25 MG/ML IJ SOLN
INTRAMUSCULAR | Status: DC | PRN
  Administered 2012-09-26 (×2): 12.5 mg via INTRAVENOUS

## 2012-09-26 MED ORDER — MORPHINE SULFATE 0.5 MG/ML IJ SOLN
INTRAMUSCULAR | Status: AC
  Filled 2012-09-26: qty 10

## 2012-09-26 MED ORDER — DIPHENHYDRAMINE HCL 25 MG PO CAPS: 25.0000 mg | ORAL_CAPSULE | Freq: Four times a day (QID) | ORAL | Status: DC | PRN

## 2012-09-26 MED ORDER — DIPHENHYDRAMINE HCL 25 MG PO CAPS
25.0000 mg | ORAL_CAPSULE | ORAL | Status: DC | PRN
  Administered 2012-09-27 – 2012-09-28 (×3): 25 mg via ORAL
  Filled 2012-09-26 (×4): qty 1

## 2012-09-26 MED ORDER — METOCLOPRAMIDE HCL 5 MG/ML IJ SOLN: 10.0000 mg | Freq: Three times a day (TID) | INTRAMUSCULAR | Status: DC | PRN

## 2012-09-26 MED ORDER — MENTHOL 3 MG MT LOZG: 1.0000 | LOZENGE | OROMUCOSAL | Status: DC | PRN

## 2012-09-26 MED ORDER — BUPIVACAINE HCL (PF) 0.25 % IJ SOLN
INTRAMUSCULAR | Status: AC
  Filled 2012-09-26: qty 30

## 2012-09-26 MED ORDER — KETOROLAC TROMETHAMINE 30 MG/ML IJ SOLN
30.0000 mg | Freq: Four times a day (QID) | INTRAMUSCULAR | Status: AC | PRN
  Administered 2012-09-26: 30 mg via INTRAVENOUS
  Filled 2012-09-26: qty 1

## 2012-09-26 MED ORDER — OXYTOCIN 10 UNIT/ML IJ SOLN
40.0000 [IU] | INTRAVENOUS | Status: DC | PRN
  Administered 2012-09-26: 40 [IU] via INTRAVENOUS

## 2012-09-26 MED ORDER — MEPERIDINE HCL 25 MG/ML IJ SOLN: 6.2500 mg | INTRAMUSCULAR | Status: DC | PRN

## 2012-09-26 MED ORDER — OXYTOCIN 10 UNIT/ML IJ SOLN
INTRAMUSCULAR | Status: AC
  Filled 2012-09-26: qty 4

## 2012-09-26 MED ORDER — SIMETHICONE 80 MG PO CHEW
80.0000 mg | CHEWABLE_TABLET | Freq: Three times a day (TID) | ORAL | Status: DC
  Administered 2012-09-27 – 2012-09-29 (×9): 80 mg via ORAL

## 2012-09-26 MED ORDER — IBUPROFEN 600 MG PO TABS
600.0000 mg | ORAL_TABLET | Freq: Four times a day (QID) | ORAL | Status: DC
  Administered 2012-09-27 – 2012-09-29 (×11): 600 mg via ORAL
  Filled 2012-09-26 (×11): qty 1

## 2012-09-26 MED ORDER — FENTANYL CITRATE 0.05 MG/ML IJ SOLN
INTRAMUSCULAR | Status: AC
Start: 2012-09-26 — End: 2012-09-26
  Filled 2012-09-26: qty 2

## 2012-09-26 MED ORDER — DIBUCAINE 1 % RE OINT: 1.0000 "application " | TOPICAL_OINTMENT | RECTAL | Status: DC | PRN

## 2012-09-26 MED ORDER — BUPIVACAINE HCL (PF) 0.25 % IJ SOLN
INTRAMUSCULAR | Status: DC | PRN
  Administered 2012-09-26: 20 mL

## 2012-09-26 MED ORDER — DIPHENHYDRAMINE HCL 50 MG/ML IJ SOLN: 25.0000 mg | INTRAMUSCULAR | Status: DC | PRN

## 2012-09-26 MED ORDER — NALBUPHINE SYRINGE 5 MG/0.5 ML
5.0000 mg | INJECTION | INTRAMUSCULAR | Status: DC | PRN
  Filled 2012-09-26 (×2): qty 1

## 2012-09-26 MED ORDER — TETANUS-DIPHTH-ACELL PERTUSSIS 5-2.5-18.5 LF-MCG/0.5 IM SUSP: 0.5000 mL | Freq: Once | INTRAMUSCULAR | Status: DC

## 2012-09-26 MED ORDER — ACETAMINOPHEN 160 MG/5ML PO SOLN
650.0000 mg | Freq: Once | ORAL | Status: AC
  Administered 2012-09-26: 650 mg via ORAL
  Filled 2012-09-26: qty 20.3

## 2012-09-26 MED ORDER — FENTANYL CITRATE 0.05 MG/ML IJ SOLN
25.0000 ug | INTRAMUSCULAR | Status: DC | PRN
  Administered 2012-09-26 (×2): 25 ug via INTRAVENOUS

## 2012-09-26 MED ORDER — PRENATAL MULTIVITAMIN CH
1.0000 | ORAL_TABLET | Freq: Every day | ORAL | Status: DC
  Administered 2012-09-27 – 2012-09-29 (×3): 1 via ORAL
  Filled 2012-09-26 (×3): qty 1

## 2012-09-26 MED ORDER — SIMETHICONE 80 MG PO CHEW: 80.0000 mg | CHEWABLE_TABLET | ORAL | Status: DC | PRN

## 2012-09-26 MED ORDER — NALOXONE HCL 1 MG/ML IJ SOLN
1.0000 ug/kg/h | INTRAVENOUS | Status: DC | PRN
  Filled 2012-09-26: qty 2

## 2012-09-26 MED ORDER — LACTATED RINGERS IV SOLN
INTRAVENOUS | Status: DC | PRN
  Administered 2012-09-26 (×2): via INTRAVENOUS

## 2012-09-26 MED ORDER — ONDANSETRON HCL 4 MG PO TABS: 4.0000 mg | ORAL_TABLET | ORAL | Status: DC | PRN

## 2012-09-26 MED ORDER — MORPHINE SULFATE (PF) 0.5 MG/ML IJ SOLN
INTRAMUSCULAR | Status: DC | PRN
  Administered 2012-09-26: 3 mg via EPIDURAL

## 2012-09-26 MED ORDER — FENTANYL CITRATE 0.05 MG/ML IJ SOLN
INTRAMUSCULAR | Status: DC | PRN
  Administered 2012-09-26 (×2): 50 ug via INTRAVENOUS

## 2012-09-26 MED ORDER — KETOROLAC TROMETHAMINE 30 MG/ML IJ SOLN
30.0000 mg | Freq: Four times a day (QID) | INTRAMUSCULAR | Status: AC | PRN
  Administered 2012-09-26: 30 mg via INTRAMUSCULAR

## 2012-09-26 MED ORDER — ONDANSETRON HCL 4 MG/2ML IJ SOLN
INTRAMUSCULAR | Status: AC
  Filled 2012-09-26: qty 2

## 2012-09-26 MED ORDER — PROMETHAZINE HCL 25 MG/ML IJ SOLN: 6.2500 mg | INTRAMUSCULAR | Status: DC | PRN

## 2012-09-26 MED ORDER — NALBUPHINE SYRINGE 5 MG/0.5 ML
5.0000 mg | INJECTION | INTRAMUSCULAR | Status: DC | PRN
  Filled 2012-09-26: qty 1

## 2012-09-26 MED ORDER — OXYCODONE-ACETAMINOPHEN 5-325 MG PO TABS
1.0000 | ORAL_TABLET | ORAL | Status: DC | PRN
  Administered 2012-09-27: 2 via ORAL
  Administered 2012-09-27: 1 via ORAL
  Administered 2012-09-27 (×2): 2 via ORAL
  Administered 2012-09-27: 1 via ORAL
  Administered 2012-09-27 – 2012-09-28 (×3): 2 via ORAL
  Filled 2012-09-26: qty 2
  Filled 2012-09-26: qty 1
  Filled 2012-09-26: qty 2
  Filled 2012-09-26: qty 1
  Filled 2012-09-26 (×5): qty 2

## 2012-09-26 MED ORDER — WITCH HAZEL-GLYCERIN EX PADS: 1.0000 "application " | MEDICATED_PAD | CUTANEOUS | Status: DC | PRN

## 2012-09-26 MED ORDER — LANOLIN HYDROUS EX OINT: 1.0000 "application " | TOPICAL_OINTMENT | CUTANEOUS | Status: DC | PRN

## 2012-09-26 MED ORDER — DIPHENHYDRAMINE HCL 50 MG/ML IJ SOLN: 12.5000 mg | INTRAMUSCULAR | Status: DC | PRN

## 2012-09-26 MED ORDER — FENTANYL CITRATE 0.05 MG/ML IJ SOLN
INTRAMUSCULAR | Status: AC
  Filled 2012-09-26: qty 2

## 2012-09-26 MED ORDER — NALOXONE HCL 0.4 MG/ML IJ SOLN: 0.4000 mg | INTRAMUSCULAR | Status: DC | PRN

## 2012-09-26 MED ORDER — KETOROLAC TROMETHAMINE 30 MG/ML IJ SOLN
INTRAMUSCULAR | Status: AC
  Filled 2012-09-26: qty 1

## 2012-09-26 MED ORDER — FERROUS SULFATE 325 (65 FE) MG PO TABS
325.0000 mg | ORAL_TABLET | Freq: Two times a day (BID) | ORAL | Status: DC
  Administered 2012-09-27 – 2012-09-29 (×5): 325 mg via ORAL
  Filled 2012-09-26 (×5): qty 1

## 2012-09-26 MED ORDER — LACTATED RINGERS IV SOLN
INTRAVENOUS | Status: DC
  Administered 2012-09-26: 13:00:00 via INTRAVENOUS

## 2012-09-26 MED ORDER — ZOLPIDEM TARTRATE 5 MG PO TABS: 5.0000 mg | ORAL_TABLET | Freq: Every evening | ORAL | Status: DC | PRN

## 2012-09-26 MED ORDER — ONDANSETRON HCL 4 MG/2ML IJ SOLN: 4.0000 mg | Freq: Three times a day (TID) | INTRAMUSCULAR | Status: DC | PRN

## 2012-09-26 MED ORDER — SODIUM CHLORIDE 0.9 % IJ SOLN: 3.0000 mL | INTRAMUSCULAR | Status: DC | PRN

## 2012-09-26 MED ORDER — MIDAZOLAM HCL 2 MG/2ML IJ SOLN: 0.5000 mg | Freq: Once | INTRAMUSCULAR | Status: DC | PRN

## 2012-09-26 MED ORDER — ALBUTEROL SULFATE HFA 108 (90 BASE) MCG/ACT IN AERS: 2.0000 | INHALATION_SPRAY | Freq: Four times a day (QID) | RESPIRATORY_TRACT | Status: DC | PRN

## 2012-09-26 MED ORDER — CEFAZOLIN SODIUM-DEXTROSE 2-3 GM-% IV SOLR
2.0000 g | Freq: Three times a day (TID) | INTRAVENOUS | Status: DC
  Administered 2012-09-26: 2 g via INTRAVENOUS
  Filled 2012-09-26 (×2): qty 50

## 2012-09-26 MED ORDER — SCOPOLAMINE 1 MG/3DAYS TD PT72: 1.0000 | MEDICATED_PATCH | Freq: Once | TRANSDERMAL | Status: DC

## 2012-09-26 SURGICAL SUPPLY — 36 items
APL SKNCLS STERI-STRIP NONHPOA (GAUZE/BANDAGES/DRESSINGS) ×1
BENZOIN TINCTURE PRP APPL 2/3 (GAUZE/BANDAGES/DRESSINGS) ×2 IMPLANT
BOOTIES KNEE HIGH SLOAN (MISCELLANEOUS) ×4 IMPLANT
CLAMP CORD UMBIL (MISCELLANEOUS) IMPLANT
CLOTH BEACON ORANGE TIMEOUT ST (SAFETY) ×2 IMPLANT
DRAIN JACKSON PRT FLT 10 (DRAIN) IMPLANT
DRAPE LG THREE QUARTER DISP (DRAPES) ×2 IMPLANT
DRSG OPSITE POSTOP 4X10 (GAUZE/BANDAGES/DRESSINGS) ×2 IMPLANT
DURAPREP 26ML APPLICATOR (WOUND CARE) ×2 IMPLANT
ELECT REM PT RETURN 9FT ADLT (ELECTROSURGICAL) ×2
ELECTRODE REM PT RTRN 9FT ADLT (ELECTROSURGICAL) ×1 IMPLANT
EVACUATOR SILICONE 100CC (DRAIN) IMPLANT
EXTRACTOR VACUUM M CUP 4 TUBE (SUCTIONS) IMPLANT
GLOVE BIOGEL PI IND STRL 7.0 (GLOVE) ×1 IMPLANT
GLOVE BIOGEL PI INDICATOR 7.0 (GLOVE) ×1
GLOVE ECLIPSE 6.5 STRL STRAW (GLOVE) ×2 IMPLANT
GOWN STRL REIN XL XLG (GOWN DISPOSABLE) ×4 IMPLANT
KIT ABG SYR 3ML LUER SLIP (SYRINGE) IMPLANT
NDL HYPO 25X5/8 SAFETYGLIDE (NEEDLE) IMPLANT
NEEDLE HYPO 22GX1.5 SAFETY (NEEDLE) ×2 IMPLANT
NEEDLE HYPO 25X5/8 SAFETYGLIDE (NEEDLE) IMPLANT
NS IRRIG 1000ML POUR BTL (IV SOLUTION) ×4 IMPLANT
PACK C SECTION WH (CUSTOM PROCEDURE TRAY) ×2 IMPLANT
PAD OB MATERNITY 4.3X12.25 (PERSONAL CARE ITEMS) ×2 IMPLANT
RTRCTR C-SECT PINK 25CM LRG (MISCELLANEOUS) ×3 IMPLANT
STRIP CLOSURE SKIN 1/2X4 (GAUZE/BANDAGES/DRESSINGS) ×2 IMPLANT
SUT CHROMIC GUT AB #0 18 (SUTURE) IMPLANT
SUT MNCRL AB 3-0 PS2 27 (SUTURE) ×2 IMPLANT
SUT SILK 2 0 FSL 18 (SUTURE) IMPLANT
SUT VIC AB 0 CTX 36 (SUTURE) ×4
SUT VIC AB 0 CTX36XBRD ANBCTRL (SUTURE) ×2 IMPLANT
SUT VIC AB 1 CT1 36 (SUTURE) ×4 IMPLANT
SYR 20CC LL (SYRINGE) ×2 IMPLANT
TOWEL OR 17X24 6PK STRL BLUE (TOWEL DISPOSABLE) ×2 IMPLANT
TRAY FOLEY CATH 14FR (SET/KITS/TRAYS/PACK) ×2 IMPLANT
WATER STERILE IRR 1000ML POUR (IV SOLUTION) ×2 IMPLANT

## 2012-09-26 NOTE — Progress Notes (Signed)
Patient ID: Betty Webb, female   DOB: 1990/08/17, 22 y.o.   MRN: [id number] Betty Webb is a 22 y.o. G1P0000 at [redacted]w[redacted]d admitted for IOL for PD  Subjective: Remains comfortable w epidural, c/o heartburn, pushing since about 3am, doesn't feel most ctx   Objective: BP 151/93  Pulse 102  Temp(Src) 98.4 F (36.9 C) (Oral)  Resp 20  Ht 5\' 7"  (1.702 m)  Wt 232 lb (105.235 kg)  BMI 36.33 kg/m2  SpO2 99%  LMP 12/14/2011     Filed Vitals:   09/26/12 0233 09/26/12 0303 09/26/12 0332 09/26/12 0403  BP: 129/81 133/79 151/93 131/86  Pulse: 81 79 102 103  Temp:   98.4 F (36.9 C)   TempSrc:   Oral   Resp:   20 18  Height:      Weight:      SpO2:         FHT:  FHR: 120 bpm, variability: moderate,  accelerations:  Present,  decelerations:  Present occ mild variable UC:   regular, every 2-3 minutes SVE:   Dilation: 10 Effacement (%): 100 Station: 0 Exam by:: e.foley,rn  Increased caput noted, vtx ?ROP,   Assessment / Plan: Arrest of decent  Labor: cervix complete at 2am, labored down 1hr, pushing 1hr, no change in fetal station since midnight, persistent OP  Preeclampsia:  no s/s Fetal Wellbeing:  Category I Pain Control:  Epidural Anticipated MOD:  undetermined  rv'd status w pt, offered to consult for cesarean section now or labor down another hour, pt would like to labor down Borderline BP's, overall 130's/80's  Pt turned to R exaggerated sims, will increase pitocin  Will begin pushing again in 1 hr or prn stronger urge Update physician PRN   Shaunte Tuft M 09/26/2012, 3:56 AM

## 2012-09-26 NOTE — Anesthesia Postprocedure Evaluation (Signed)
  Anesthesia Post-op Note  Anesthesia Post Note  Patient: Betty Webb  Procedure(s) Performed: Procedure(s) (LRB): CESAREAN SECTION (N/A)  Anesthesia type: Epidural  Patient location: PACU  Post pain: Pain level controlled  Post assessment: Post-op Vital signs reviewed  Last Vitals:  Filed Vitals:   09/26/12 1045  BP: 112/31  Pulse: 104  Temp:   Resp: 19    Post vital signs: Reviewed  Level of consciousness: awake  Complications: No apparent anesthesia complications

## 2012-09-26 NOTE — Op Note (Signed)
Preoperative diagnosis: Intrauterine pregnancy at 41 weeks with failure to descend  Post operative diagnosis: Same  Anesthesia: Epidural  Anesthesiologist: Dr. Saddie Benders  Procedure: Primary low transverse cesarean section  Surgeon: Dr. Dois Davenport Cylis Ayars  Assistant: Nigel Bridgeman CNM  Estimated blood loss: 800 cc  Procedure:  After being informed of the planned procedure and possible complications including bleeding, infection, injury to other organs, informed consent is obtained. The patient is taken to OR #2 with adequate  pre-existing epidural anesthesia. She is placed in the dorsal decubitus position with the pelvis tilted to the left. She is then prepped and draped in a sterile fashion. A Foley catheter is inserted in her bladder.  After assessing adequate level of anesthesia, we infiltrate the suprapubic area with 20 cc of Marcaine 0.25 and perform a Pfannenstiel incision which is brought down sharply to the fascia. The fascia is entered in a low transverse fashion. Linea alba is dissected. Peritoneum is entered in a midline fashion. An Alexis retractor is easily positioned.   The myometrium is then entered in a low transverse fashion, 2 cm above the vesico-uterine junction ; first with knife and then extended bluntly. Amniotic fluid is clear. We assist the birth of a female  infant in vertex ROT presentation. Mouth and nose are suctioned. The baby is delivered. The cord is clamped and sectioned. The baby is given to the neonatologist present in the room.  10 cc of blood is drawn from the umbilical vein.The placenta is allowed to deliver spontaneously. It is complete and the cord has 3 vessels. Uterine revision is negative.  We proceed with closure of the myometrium in 2 layers: First with a running locked suture of 0 Vicryl, then with a Lembert suture of 0 Vicryl imbricating the first one. Hemostasis is completed with cauterization on peritoneal edges and a figure-of-eight stitch of  0 Vicryl midline.  Both paracolic gutters are cleaned. Both tubes and ovaries are assessed and normal. The pelvis is profusely irrigated with warm saline to confirm a satisfactory hemostasis.  Retractors and sponges are removed. Under fascia hemostasis is completed with cauterization. The fascia is then closed with 2 running sutures of 0 Vicryl meeting midline. The wound is irrigated with warm saline and hemostasis is completed with cauterization. The skin is closed with a subcuticular suture of 3-0 Monocryl and Steri-Strips.  Instrument and sponge count is complete x2. Estimated blood loss is 800 cc.  The procedure is well tolerated by the patient who is taken to recovery room in a well and stable condition.  female baby named [name] was born at 10:05 and received an Apgar of 9  at 1 minute and 9 at 5 minutes.    Specimen: Placenta sent to L & D   Tru Leopard A MD 8/15/20149:58 AM

## 2012-09-26 NOTE — Transfer of Care (Signed)
Immediate Anesthesia Transfer of Care Note  Patient: Betty Webb  Procedure(s) Performed: Procedure(s): CESAREAN SECTION (N/A)  Patient Location: PACU  Anesthesia Type:Epidural  Level of Consciousness: awake, alert  and patient cooperative  Airway & Oxygen Therapy: Patient Spontanous Breathing and Patient connected to nasal cannula oxygen  Post-op Assessment: Report given to PACU RN and Post -op Vital signs reviewed and stable  Post vital signs: Reviewed and stable  Complications: No apparent anesthesia complications

## 2012-09-26 NOTE — Progress Notes (Signed)
Patient ID: Betty Webb, female   DOB: 02-19-1990, 22 y.o.   MRN: [id number] Betty Webb is a 22 y.o. G1P0000 at [redacted]w[redacted]d admitted for IOL for PD  Subjective: Overall comfortable w epidural, feeling some increased pressure in the last hour  Objective: BP 108/66  Pulse 94  Temp(Src) 98.6 F (37 C) (Oral)  Resp 20  Ht 5\' 7"  (1.702 m)  Wt 232 lb (105.235 kg)  BMI 36.33 kg/m2  SpO2 99%  LMP 12/14/2011     FHT:  FHR: 130 bpm, variability: moderate,  accelerations:  Present,  decelerations:  Present occ mild variables UC:   regular, every 2-3 minutes SVE:   Dilation: 10 Effacement (%): 100 Station: 0 Exam by:: e.foley,rn  More caput noted, vtx not descended    Assessment / Plan: Arrest of decent  Labor: appears adequate restarted pushing about 5am  Preeclampsia:  no s/s Fetal Wellbeing:  Category I Pain Control:  Epidural Anticipated MOD:  c/s  Again rv'd status w pt and offered c/s, pt desires to wait another hour before proceeding, rv'd R/B of c/s including but not limited to 1. Infection 2. Bleeding 3. Damage to internal organs 4. Anesthesia complications. Rv'd risks of prolonged labor including but not limited to 1. Damage to bladder/pelvic floor 2. FHR issues 3. Shoulder dystocia or other complications  Recheck 1hr  Discussed with dr Betty Webb 09/26/2012, 6:58 AM

## 2012-09-26 NOTE — Progress Notes (Signed)
Patient ID: Betty Webb, female   DOB: [birth year], 22 y.o.   MRN: [id number] Betty Webb is a 22 y.o. G1P0000 at [redacted]w[redacted]d admitted for IOL   Subjective: Comfortable w epidural, family supportive at [facility]  Objective: BP 131/74  Pulse 88  Temp(Src) 98.2 F (36.8 C) (Axillary)  Resp 18  Ht 5\' 7"  (1.702 m)  Wt 232 lb (105.235 kg)  BMI 36.33 kg/m2  SpO2 99%  LMP 12/14/2011     FHT:  FHR: 120 bpm, variability: moderate,  accelerations:  Present,  decelerations:  Present occ mild early variable UC:   regular, every 1-2 minutes SVE:   Dilation: Lip/rim Effacement (%): 100 Station: +1 Exam by:: lilliard  Cervix from 11 o'clock - 2 o'clock  vtx ?ROP   Assessment / Plan: Induction of labor due to postterm,  progressing well on pitocin  Labor: Progressing normally Preeclampsia:  no s/s Fetal Wellbeing:  Category I Pain Control:  Epidural Anticipated MOD:  NSVD  Recheck 2hrs or with increased feeling of pressure   Update physician PRN   Simisola Sandles M 09/26/2012, 12:12 AM

## 2012-09-26 NOTE — Progress Notes (Signed)
Patient ID: Betty Webb, female   DOB: 12/08/1990, 22 y.o.   MRN: [id number] Betty Webb is a 22 y.o. G1P0000 at [redacted]w[redacted]d admitted for IOL for PD  Subjective: Remains comfortable w epidural, feeling more pressure   Objective: BP 113/92  Pulse 90  Temp(Src) 98.5 F (36.9 C) (Oral)  Resp 20  Ht 5\' 7"  (1.702 m)  Wt 232 lb (105.235 kg)  BMI 36.33 kg/m2  SpO2 99%  LMP 12/14/2011     FHT:  FHR: 120 bpm, variability: moderate,  accelerations:  Present,  decelerations:  Present occ mild variable  UC:   regular, every 2-3 minutes SVE:   Dilation: 10 Effacement (%): 100 Station: 0 Exam by:: e.foley,rn    Assessment / Plan: Induction of labor due to postterm,  progressing well on pitocin  Labor: Progressing normally Preeclampsia:  no s/s Fetal Wellbeing:  Category I Pain Control:  Epidural Anticipated MOD:  NSVD  Recheck 1hr   Update physician PRN   Malissa Hippo 09/26/2012, 2:26 AM

## 2012-09-26 NOTE — Anesthesia Postprocedure Evaluation (Signed)
Anesthesia Post Note  Patient: Betty Webb  Procedure(s) Performed: Procedure(s) (LRB): CESAREAN SECTION (N/A)  Anesthesia type: Epidural  Patient location: Mother/Baby  Post pain: Pain level controlled  Post assessment: Post-op Vital signs reviewed  Last Vitals:  Filed Vitals:   09/26/12 1641  BP: 119/78  Pulse: 93  Temp: 36.7 C  Resp: 18    Post vital signs: Reviewed  Level of consciousness:alert  Complications: No apparent anesthesia complications

## 2012-09-27 LAB — CBC
HCT: 22.3 % — ABNORMAL LOW (ref 36.0–46.0)
Hemoglobin: 7.5 g/dL — ABNORMAL LOW (ref 12.0–15.0)
MCHC: 33.6 g/dL (ref 30.0–36.0)
WBC: 12.9 10*3/uL — ABNORMAL HIGH (ref 4.0–10.5)

## 2012-09-27 MED ORDER — PNEUMOCOCCAL VAC POLYVALENT 25 MCG/0.5ML IJ INJ
0.5000 mL | INJECTION | INTRAMUSCULAR | Status: AC
  Administered 2012-09-28: 0.5 mL via INTRAMUSCULAR
  Filled 2012-09-27: qty 0.5

## 2012-09-27 MED ORDER — HYDROCORTISONE 1 % EX CREA
TOPICAL_CREAM | Freq: Four times a day (QID) | CUTANEOUS | Status: DC
  Administered 2012-09-27 – 2012-09-29 (×6): via TOPICAL
  Filled 2012-09-27 (×2): qty 28

## 2012-09-27 NOTE — Progress Notes (Addendum)
Subjective: Postpartum Day 1: Cesarean Delivery Patient reports feeling well.  Ambulating, voiding and tol po liquids and solids without difficulty.  Pos flatus.  Neg BM.  Reports pain well controlled.  Working on breastfeeding.  Main complaint is itching of abdomen and tops of thighs.  Dx with PUPPS during pregnancy.  Denies weakness or dizziness.   Objective: Vital signs in last 24 hours: Temp:  [97.8 F (36.6 C)-99 F (37.2 C)] 98 F (36.7 C) (08/16 0909) Pulse Rate:  [80-131] 103 (08/16 0909) Resp:  [16-24] 16 (08/16 0909) BP: (100-135)/(31-109) 116/72 mmHg (08/16 0909) SpO2:  [97 %-100 %] 97 % (08/16 0909)  Physical Exam:  General: alert, cooperative and no distress Heart:  RRR Lungs:  CTA bilat Breasts: soft Abd:  Soft, NT with pos BS x 4 quads.  Erythema of abdomen noted which extends to RUQ c/w PUPPS Lochia: appropriate, sm rubra Uterine Fundus: firm, NT @ umb Incision: Occlusive dsg intact.  No redness or drainage noted of incision under dressing.   DVT Evaluation: No evidence of DVT seen on physical exam. Negative Homan's sign bilat. 2+ Calf/Ankle edema is present.  PUPPS rash noted to extend to upper anterior and lateral thighs, rt greater than left   Recent Labs  09/25/12 0810 09/27/12 0620  HGB 11.2* 7.5*  HCT 33.0* 22.3*    Assessment/Plan: Status post Cesarean section. Doing well postoperatively.  PUPPS Asymptomatic postoperative anemia  Continue current care. Will check orthostatic vitals. Rec Benadryl po and 1% hydrocortisone cream topically for PUPPS.   Betty Webb O. 09/27/2012, 9:27 AM

## 2012-09-28 DIAGNOSIS — Z98891 History of uterine scar from previous surgery: Secondary | ICD-10-CM

## 2012-09-28 MED ORDER — HYDROCODONE-ACETAMINOPHEN 5-325 MG PO TABS
1.0000 | ORAL_TABLET | ORAL | Status: DC | PRN
  Administered 2012-09-28 – 2012-09-29 (×6): 2 via ORAL
  Filled 2012-09-28 (×6): qty 2

## 2012-09-28 MED ORDER — HYDROXYZINE HCL 50 MG PO TABS
50.0000 mg | ORAL_TABLET | Freq: Three times a day (TID) | ORAL | Status: DC | PRN
  Administered 2012-09-28 – 2012-09-29 (×2): 50 mg via ORAL
  Filled 2012-09-28 (×2): qty 1

## 2012-09-28 MED ORDER — HYDROXYZINE HCL 50 MG/ML IM SOLN
50.0000 mg | Freq: Four times a day (QID) | INTRAMUSCULAR | Status: DC | PRN
  Filled 2012-09-28: qty 1

## 2012-09-28 NOTE — Progress Notes (Signed)
Patient ID: Betty Webb, female   DOB: 25-Jun-1990, 22 y.o.   MRN: [id number] Subjective: Postpartum Day 2: Cesarean Delivery secondary to: failure to descend  Patient reports tolerating PO, + flatus and no problems voiding.   C/o itching and rash that seems to be spreading, initially had pups (prior to delivery), this rash is papule like and is on legs and chest now,  up ad lib without syncope Pain well controlled with po meds BF well  Mood stable, bonding well Contraception: undecided   Objective: Vital signs in last 24 hours: Temp:  [98.4 F (36.9 C)-98.5 F (36.9 C)] 98.5 F (36.9 C) (08/17 0555) Pulse Rate:  [103-106] 106 (08/17 0555) Resp:  [18-20] 18 (08/17 0555) BP: (121-122)/(64-72) 122/72 mmHg (08/17 0555)  Physical Exam:  General: alert and no distress Heart: RRR Lungs: CTAB Abdomen: BS x4 Uterine Fundus: firm Incision: healing well  Honeycomb dressing CDI  Lochia: appropriate DVT Evaluation: No evidence of DVT seen on physical exam. Negative Homan's sign. Calf/Ankle edema is present.   Recent Labs  09/27/12 0620  HGB 7.5*  HCT 22.3*    Assessment/Plan: Status post Cesarean section. Doing well postoperatively.  Possible allergy to percocet, will switch to vicodin and alternate vistaril with benadryl, continue use of hydrocortisone cream  Plan discharge home tomorrow  D/w Dr Imagene Gurney 09/28/2012, 3:17 PM

## 2012-09-28 NOTE — Lactation Note (Signed)
This note was copied from the chart of Betty Webb. Lactation Consultation Note: Baby very fussy when I went in. Mom reports that she has been fussy all morning and nursed for 1 hour earlier today. Assisted with latch after diaper change. Helped mom get the baby a  Little deeper on the breast and baby nursed for 10 more minutes and then off to sleep. Reviewed wide open mouth and keeping the baby close to the breast throughout the feeding. Encouraged mom not to use pacifier- to feed whenever she is showing feeding cues. No questions at present. To rest as much as possible.   Patient Name: Betty Mizani Dilday [id number] Date: 09/28/2012 Reason for consult: Follow-up assessment   Maternal Data    Feeding Feeding Type: Breast Milk Length of feed: 15 min  LATCH Score/Interventions Latch: Grasps breast easily, tongue down, lips flanged, rhythmical sucking. Intervention(s): Skin to skin Intervention(s): Adjust position;Assist with latch;Breast massage;Breast compression  Audible Swallowing: A few with stimulation Intervention(s): Skin to skin;Hand expression;Alternate breast massage  Type of Nipple: Everted at rest and after stimulation  Comfort (Breast/Nipple): Soft / non-tender  Interventions (Mild/moderate discomfort): Hand massage  Hold (Positioning): Assistance needed to correctly position infant at breast and maintain latch. Intervention(s): Breastfeeding basics reviewed;Support Pillows  LATCH Score: 8  Lactation Tools Discussed/Used     Consult Status Consult Status: Follow-up Date: 09/29/12 Follow-up type: In-patient    Pamelia Hoit 09/28/2012, 12:05 PM

## 2012-09-29 ENCOUNTER — Encounter (HOSPITAL_COMMUNITY): Payer: Self-pay | Admitting: Obstetrics and Gynecology

## 2012-09-29 DIAGNOSIS — R21 Rash and other nonspecific skin eruption: Secondary | ICD-10-CM | POA: Diagnosis present

## 2012-09-29 MED ORDER — PREDNISONE 20 MG PO TABS
20.0000 mg | ORAL_TABLET | Freq: Once | ORAL | Status: AC
  Administered 2012-09-29: 20 mg via ORAL
  Filled 2012-09-29: qty 1

## 2012-09-29 MED ORDER — IBUPROFEN 600 MG PO TABS: 600.0000 mg | ORAL_TABLET | Freq: Four times a day (QID) | ORAL | Status: DC | PRN

## 2012-09-29 MED ORDER — PREDNISONE 5 MG PO TABS: ORAL_TABLET | ORAL | Status: DC

## 2012-09-29 MED ORDER — FERROUS SULFATE 325 (65 FE) MG PO TABS: 325.0000 mg | ORAL_TABLET | Freq: Two times a day (BID) | ORAL | Status: DC

## 2012-09-29 MED ORDER — HYDROCODONE-ACETAMINOPHEN 5-325 MG PO TABS: 1.0000 | ORAL_TABLET | ORAL | Status: DC | PRN

## 2012-09-29 NOTE — Discharge Summary (Signed)
Cesarean Section Delivery Discharge Summary  Betty Webb  DOB:    Jan 14, 1991 MRN:    [id number] CSN:    [id number]  Date of admission:                  09/25/12  Date of discharge:                   09/29/12  Procedures this admission:  Date of Delivery: 09/26/12  Newborn Data:  Live born female  Birth Weight: 9 lb 1 oz (4110 g) APGAR: 9, 9  Home with mother. Name: Betty Webb   History of Present Illness:  Betty Webb is a 22 y.o. female, G1P1001, who presents at [redacted]w[redacted]d weeks gestation. The patient has been followed at the [facility] and Gynecology division of [facility].    Her pregnancy has been complicated by:  Patient Active Problem List   Diagnosis Date Noted  . Rash 09/29/2012  . Status post primary low transverse cesarean section 09/28/2012  . Uterine size date discrepancy 09/25/2012  . Known or suspected fetal abnormality affecting management of mother 09/25/2012  . Kidney stone complicating pregnancy 09/19/2012  . Asthma--mild 09/19/2012  . Anxiety and depression 09/19/2012    Hospital course:  The patient was admitted for induction of labor due to postdates on 09/25/12.   She was induced with pitocin, due to a favorable cervix.  She received an epidural as labor progressed.  She became complete, but had failure to descend after 3 hours complete and 2 1/2 hours of active pushing. Dr. Estanislado Pandy performed the primary cesarean delivery, for a 9 lb 1 oz female.  Mother and baby tolerated the procedure without difficulty. Her postpartum course was remarkable for anemia without hemodynamic instability, with pre-delivery Hgb of 11.2 and post-delivery Hgb of 7.5.  Orthostatics were stable, and the patient declined transfusion.  She also had continuing issues with a widely-distributed rash, that had been present on her abdomen prior to delivery, dx as PUPPS and treated symptomatically.  During her hospital stay, she was noted to have  a possible rxn to Percocet, so her pain medication was changed to Vicodin with benefit and no rxn.  On the day of d/c, she was still noted to have a widely-distributed papular rash on abdomen, arms, legs, and now feet.  Dr. Su Hilt was consulted, and bile acids were drawn prior to d/c.  She was also started on a steroid course, with Prednisone 20 mg po given at discharge on 8/18, and a plan for 10 mg po x 1 on 8/19, and 5 mg po x 1 on 8/20.  She was discharged to home on postpartum day 3 in stable condition.  The office will f/u with her regarding her status later this week.  Feeding:  breast  Contraception:  oral progesterone-only contraceptive  Discharge hemoglobin:  Hemoglobin  Date Value Range Status  09/27/2012 7.5* 12.0 - 15.0 g/dL Final     REPEATED TO VERIFY     DELTA CHECK NOTED     HCT  Date Value Range Status  09/27/2012 22.3* 36.0 - 46.0 % Final    Discharge Physical Exam:   General: alert Lochia: appropriate Uterine Fundus: firm Incision: Honeycomb dressing CDI DVT Evaluation: No evidence of DVT seen on physical exam. Negative Homan's sign. Erythematous, papular rash noted on arms, legs, feet, and on abdomen.   Intrapartum Procedures: cesarean: low cervical, transverse due to failure to descend Postpartum Procedures: none Complications-Operative  and Postpartum: Anemia without hemodynamic compromise, rash  Discharge Diagnoses: Term Pregnancy-delivered, Failure to descend, primary cesarean birth, anemia, papular rash  Discharge Information:  Activity:           Per [facility] handout Diet:                routine Medications: Ibuprofen, Iron, Vicodin and Prednisone 10 mg po x 1 on 8/19, then 5 mg po x 1 on 8/20.  Continue Benadryl as needed for itching, and Hydrocortisone 1% cream for rash.  Micronor to start in at least 3 weeks. Condition:      stable Instructions:  refer to practice specific booklet Discharge to: home  Bile acids are pending at the time of  d/c.  Follow-up Information   Follow up with [facility]. Schedule an appointment as soon as possible for a visit in 6 weeks. (Office will call you this week to f/u on the status of your rash.  Call with any questions or concerns.)    Specialty:  Obstetrics and Gynecology   Contact information:   [address] [place] [address] [phone]       Nigel Bridgeman 09/29/2012

## 2012-09-29 NOTE — Progress Notes (Signed)
Subjective: Postpartum Day 3: Cesarean Delivery due to FTP Patient up ad lib, reports no syncope or dizziness.  Still has significant erythematous rash, now distributed down to tops of feet.  Patient was changed from Percocet to Vicodin yesterday--she feels Vicodin is working well for pain, and does not feel it has added to her rash, but is unsure why the rash has continued/spread. Feeding:  Breast Contraceptive plan:  Micronor  Objective: Vital signs in last 24 hours: Temp:  [98.1 F (36.7 C)-98.7 F (37.1 C)] 98.7 F (37.1 C) (08/18 0515) Pulse Rate:  [104-105] 104 (08/18 0515) Resp:  [19-20] 19 (08/18 0515) BP: (128-131)/(56-62) 128/56 mmHg (08/18 0515) SpO2:  [98 %] 98 % (08/18 0515)  Physical Exam:  General: alert Lochia: appropriate Uterine Fundus: firm Incision: Honeycomb dressing CDI DVT Evaluation: No evidence of DVT seen on physical exam. Negative Homan's sign. JP drain:   NA  Papular rash distributed over abdomen, arms, legs, and feet--itchy to patient.   Recent Labs  09/27/12 0620  HGB 7.5*  HCT 22.3*  Orthostatics stable.   Assessment/Plan: Status post Cesarean section day 3 Papular rash Anemia, without hemodynamic instability.  Plan: Consulted with Dr. Su Hilt Plan d/c today. Check bile acids prior to d/c Prednisone 20 mg po today prior to d/c, then 10 mg po tomorrow, then 5 mg po the following day. Plan f/u in office later this week if no improvement in rash. May continue Benadryl at home. Patient also requests Rx for breast pump.   Nigel Bridgeman 09/29/2012, 7:53 AM
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
## 2012-10-01 LAB — BILE ACIDS, TOTAL: Bile Acids Total: 4 umol/L (ref 0–19)

## 2012-10-10 ENCOUNTER — Ambulatory Visit: Payer: Self-pay | Admitting: Family Medicine

## 2012-10-10 LAB — COMPREHENSIVE METABOLIC PANEL
Albumin: 4.2 g/dL (ref 3.4–5.0)
Anion Gap: 9 (ref 7–16)
Bilirubin,Total: 0.2 mg/dL (ref 0.2–1.0)
Calcium, Total: 9 mg/dL (ref 8.5–10.1)
Chloride: 106 mmol/L (ref 98–107)
Creatinine: 1.23 mg/dL (ref 0.60–1.30)
EGFR (African American): >60
EGFR (Non-African Amer.): >60
Glucose: 85 mg/dL (ref 65–99)
Osmolality: 280 (ref 275–301)
Potassium: 3.9 mmol/L (ref 3.5–5.1)
SGOT(AST): 19 U/L (ref 15–37)
SGPT (ALT): 27 U/L (ref 12–78)

## 2012-10-10 LAB — CBC WITH DIFFERENTIAL/PLATELET
Basophil %: 1.2 %
HCT: 34.3 % — ABNORMAL LOW (ref 35.0–47.0)
Lymphocyte #: 2.4 10*3/uL (ref 1.0–3.6)
MCHC: 32.9 g/dL (ref 32.0–36.0)
MCV: 86 fL (ref 80–100)
Monocyte #: 0.4 x10 3/mm (ref 0.2–0.9)
RBC: 4.01 10*6/uL (ref 3.80–5.20)
RDW: 14.6 % — ABNORMAL HIGH (ref 11.5–14.5)

## 2012-10-10 LAB — URINALYSIS, COMPLETE
Bilirubin,UR: NEGATIVE
Ph: 6 (ref 4.5–8.0)

## 2013-12-14 ENCOUNTER — Encounter (HOSPITAL_COMMUNITY): Payer: Self-pay | Admitting: Obstetrics and Gynecology

## 2016-08-24 ENCOUNTER — Ambulatory Visit
Admission: RE | Admit: 2016-08-24 | Discharge: 2016-08-24 | Payer: MEDICAID | Attending: Physician Assistant | Admitting: Physician Assistant

## 2016-08-24 DIAGNOSIS — T148XXA Other injury of unspecified body region, initial encounter: Principal | ICD-10-CM

## 2016-08-24 DIAGNOSIS — F419 Anxiety disorder, unspecified: Secondary | ICD-10-CM

## 2016-08-24 DIAGNOSIS — F988 Other specified behavioral and emotional disorders with onset usually occurring in childhood and adolescence: Secondary | ICD-10-CM

## 2016-08-24 MED ORDER — VALACYCLOVIR 500 MG TABLET
ORAL_TABLET | 0 refills | 0 days | Status: CP
Start: 2016-08-24 — End: ?

## 2016-08-24 MED ORDER — CYCLOBENZAPRINE 5 MG TABLET
ORAL_TABLET | Freq: Three times a day (TID) | ORAL | 0 refills | 0 days | Status: CP
Start: 2016-08-24 — End: ?

## 2016-08-24 MED ORDER — ALPRAZOLAM 0.5 MG TABLET
ORAL_TABLET | Freq: Two times a day (BID) | ORAL | 3 refills | 0 days | Status: CP | PRN
Start: 2016-08-24 — End: 2017-08-24

## 2016-08-24 MED ORDER — DEXTROAMPHETAMINE-AMPHETAMINE 10 MG TABLET
ORAL_TABLET | Freq: Four times a day (QID) | ORAL | 0 refills | 0 days | Status: CP | PRN
Start: 2016-08-24 — End: 2016-08-24

## 2016-08-24 MED ORDER — CITALOPRAM 20 MG TABLET
ORAL_TABLET | Freq: Every day | ORAL | 3 refills | 0.00000 days | Status: CP
Start: 2016-08-24 — End: 2017-01-14

## 2016-09-21 MED ORDER — DEXTROAMPHETAMINE-AMPHETAMINE 10 MG TABLET
ORAL_TABLET | Freq: Four times a day (QID) | ORAL | 0 refills | 0 days | Status: CP | PRN
Start: 2016-09-21 — End: 2016-08-24

## 2016-10-19 MED ORDER — DEXTROAMPHETAMINE-AMPHETAMINE 10 MG TABLET
ORAL_TABLET | Freq: Four times a day (QID) | ORAL | 0 refills | 0 days | Status: CP | PRN
Start: 2016-10-19 — End: 2016-11-13

## 2016-11-13 MED ORDER — DEXTROAMPHETAMINE-AMPHETAMINE 10 MG TABLET
ORAL_TABLET | Freq: Four times a day (QID) | ORAL | 0 refills | 0.00000 days | Status: CP | PRN
Start: 2016-11-13 — End: 2017-05-15

## 2016-11-14 MED ORDER — DEXTROAMPHETAMINE-AMPHETAMINE 10 MG TABLET
ORAL_TABLET | Freq: Four times a day (QID) | ORAL | 0 refills | 0.00000 days | Status: CP | PRN
Start: 2016-11-14 — End: 2017-01-14

## 2017-01-14 ENCOUNTER — Ambulatory Visit
Admission: RE | Admit: 2017-01-14 | Discharge: 2017-01-14 | Payer: MEDICAID | Attending: Physician Assistant | Admitting: Physician Assistant

## 2017-01-14 DIAGNOSIS — F988 Other specified behavioral and emotional disorders with onset usually occurring in childhood and adolescence: Secondary | ICD-10-CM

## 2017-01-14 DIAGNOSIS — F419 Anxiety disorder, unspecified: Principal | ICD-10-CM

## 2017-01-14 MED ORDER — DEXTROAMPHETAMINE-AMPHETAMINE ER 20 MG 24HR CAPSULE,EXTEND RELEASE
ORAL_CAPSULE | Freq: Every morning | ORAL | 0 refills | 0.00000 days | Status: CP
Start: 2017-01-14 — End: 2017-02-11

## 2017-01-14 MED ORDER — DEXTROAMPHETAMINE-AMPHETAMINE 10 MG TABLET
ORAL_TABLET | Freq: Two times a day (BID) | ORAL | 0 refills | 0.00000 days | Status: CP
Start: 2017-01-14 — End: 2017-02-11

## 2017-01-14 MED ORDER — CITALOPRAM 20 MG TABLET
ORAL_TABLET | Freq: Every day | ORAL | 3 refills | 0.00000 days | Status: CP
Start: 2017-01-14 — End: 2017-04-08

## 2017-02-11 MED ORDER — DEXTROAMPHETAMINE-AMPHETAMINE 10 MG TABLET
ORAL_TABLET | Freq: Two times a day (BID) | ORAL | 0 refills | 0 days | Status: CP
Start: 2017-02-11 — End: 2017-03-13

## 2017-02-11 MED ORDER — DEXTROAMPHETAMINE-AMPHETAMINE ER 20 MG 24HR CAPSULE,EXTEND RELEASE
ORAL_CAPSULE | Freq: Every morning | ORAL | 0 refills | 0 days | Status: CP
Start: 2017-02-11 — End: 2017-03-13

## 2017-03-15 MED ORDER — DEXTROAMPHETAMINE-AMPHETAMINE ER 20 MG 24HR CAPSULE,EXTEND RELEASE
ORAL_CAPSULE | Freq: Every morning | ORAL | 0 refills | 0.00000 days | Status: CP
Start: 2017-03-15 — End: 2017-04-08

## 2017-03-15 MED ORDER — DEXTROAMPHETAMINE-AMPHETAMINE 10 MG TABLET
ORAL_TABLET | Freq: Every day | ORAL | 0 refills | 0 days | Status: CP | PRN
Start: 2017-03-15 — End: 2017-04-08

## 2017-04-08 ENCOUNTER — Encounter: Admit: 2017-04-08 | Discharge: 2017-04-09 | Payer: PRIVATE HEALTH INSURANCE

## 2017-04-08 DIAGNOSIS — F988 Other specified behavioral and emotional disorders with onset usually occurring in childhood and adolescence: Principal | ICD-10-CM

## 2017-04-08 MED ORDER — CITALOPRAM 40 MG TABLET
ORAL_TABLET | Freq: Every day | ORAL | 3 refills | 0.00000 days | Status: CP
Start: 2017-04-08 — End: 2017-11-20

## 2017-04-08 MED ORDER — DEXTROAMPHETAMINE-AMPHETAMINE 10 MG TABLET
ORAL_TABLET | Freq: Two times a day (BID) | ORAL | 0 refills | 0 days | Status: CP
Start: 2017-04-08 — End: 2017-04-15

## 2017-04-08 MED ORDER — DEXTROAMPHETAMINE-AMPHETAMINE ER 20 MG 24HR CAPSULE,EXTEND RELEASE
ORAL_CAPSULE | Freq: Every morning | ORAL | 0 refills | 0.00000 days | Status: CP
Start: 2017-04-08 — End: 2017-05-15

## 2017-04-15 MED ORDER — DEXTROAMPHETAMINE-AMPHETAMINE 10 MG TABLET
ORAL_TABLET | Freq: Two times a day (BID) | ORAL | 0 refills | 0.00000 days | Status: CP
Start: 2017-04-15 — End: 2017-12-24

## 2017-05-17 MED ORDER — DEXTROAMPHETAMINE-AMPHETAMINE ER 20 MG 24HR CAPSULE,EXTEND RELEASE
ORAL_CAPSULE | Freq: Every morning | ORAL | 0 refills | 0 days | Status: CP
Start: 2017-05-17 — End: 2017-06-17

## 2017-05-17 MED ORDER — DEXTROAMPHETAMINE-AMPHETAMINE 10 MG TABLET
ORAL_TABLET | Freq: Four times a day (QID) | ORAL | 0 refills | 0 days | Status: CP | PRN
Start: 2017-05-17 — End: 2017-06-17

## 2017-06-17 MED ORDER — DEXTROAMPHETAMINE-AMPHETAMINE ER 20 MG 24HR CAPSULE,EXTEND RELEASE
ORAL_CAPSULE | Freq: Every morning | ORAL | 0 refills | 0.00000 days | Status: CP
Start: 2017-06-17 — End: 2017-07-24

## 2017-06-17 MED ORDER — DEXTROAMPHETAMINE-AMPHETAMINE 10 MG TABLET
ORAL_TABLET | Freq: Four times a day (QID) | ORAL | 0 refills | 0 days | Status: CP | PRN
Start: 2017-06-17 — End: 2017-07-24

## 2017-07-24 MED ORDER — DEXTROAMPHETAMINE-AMPHETAMINE ER 20 MG 24HR CAPSULE,EXTEND RELEASE
ORAL_CAPSULE | Freq: Every morning | ORAL | 0 refills | 0.00000 days | Status: CP
Start: 2017-07-24 — End: 2017-12-24

## 2017-07-24 MED ORDER — DEXTROAMPHETAMINE-AMPHETAMINE 10 MG TABLET
ORAL_TABLET | Freq: Four times a day (QID) | ORAL | 0 refills | 0 days | Status: CP | PRN
Start: 2017-07-24 — End: 2017-12-24

## 2017-11-20 ENCOUNTER — Encounter
Admit: 2017-11-20 | Discharge: 2017-11-21 | Payer: PRIVATE HEALTH INSURANCE | Attending: Psychiatric/Mental Health | Primary: Psychiatric/Mental Health

## 2017-11-20 DIAGNOSIS — F3340 Major depressive disorder, recurrent, in remission, unspecified: Secondary | ICD-10-CM

## 2017-11-20 DIAGNOSIS — F419 Anxiety disorder, unspecified: Secondary | ICD-10-CM

## 2017-11-20 DIAGNOSIS — F909 Attention-deficit hyperactivity disorder, unspecified type: Principal | ICD-10-CM

## 2017-11-20 MED ORDER — DEXTROAMPHETAMINE-AMPHETAMINE 10 MG TABLET
ORAL_TABLET | Freq: Two times a day (BID) | ORAL | 0 refills | 0 days | Status: CP
Start: 2017-11-20 — End: 2017-12-19

## 2017-11-20 MED ORDER — DEXTROAMPHETAMINE-AMPHETAMINE ER 20 MG 24HR CAPSULE,EXTEND RELEASE
ORAL_CAPSULE | Freq: Every morning | ORAL | 0 refills | 0.00000 days | Status: CP
Start: 2017-11-20 — End: 2017-12-19

## 2017-11-20 MED ORDER — CITALOPRAM 40 MG TABLET
ORAL_TABLET | Freq: Every day | ORAL | 3 refills | 0.00000 days | Status: CP
Start: 2017-11-20 — End: 2018-02-26

## 2017-12-20 MED ORDER — DEXTROAMPHETAMINE-AMPHETAMINE 10 MG TABLET
ORAL_TABLET | Freq: Two times a day (BID) | ORAL | 0 refills | 0.00000 days | Status: CP
Start: 2017-12-20 — End: 2017-12-24

## 2017-12-20 MED ORDER — DEXTROAMPHETAMINE-AMPHETAMINE ER 20 MG 24HR CAPSULE,EXTEND RELEASE
ORAL_CAPSULE | Freq: Every morning | ORAL | 0 refills | 0 days | Status: CP
Start: 2017-12-20 — End: 2017-12-24

## 2017-12-24 MED ORDER — DEXTROAMPHETAMINE-AMPHETAMINE 10 MG TABLET
ORAL_TABLET | Freq: Two times a day (BID) | ORAL | 0 refills | 0 days | Status: CP
Start: 2017-12-24 — End: 2018-01-22

## 2017-12-24 MED ORDER — DEXTROAMPHETAMINE-AMPHETAMINE ER 20 MG 24HR CAPSULE,EXTEND RELEASE
ORAL_CAPSULE | Freq: Every morning | ORAL | 0 refills | 0 days | Status: CP
Start: 2017-12-24 — End: 2018-01-22

## 2018-01-19 MED ORDER — DEXTROAMPHETAMINE-AMPHETAMINE ER 20 MG 24HR CAPSULE,EXTEND RELEASE
ORAL_CAPSULE | Freq: Every morning | ORAL | 0 refills | 0 days | Status: CP
Start: 2018-01-19 — End: 2017-12-24

## 2018-01-19 MED ORDER — DEXTROAMPHETAMINE-AMPHETAMINE 10 MG TABLET
ORAL_TABLET | Freq: Two times a day (BID) | ORAL | 0 refills | 0 days | Status: CP
Start: 2018-01-19 — End: 2017-12-24

## 2018-01-23 MED ORDER — DEXTROAMPHETAMINE-AMPHETAMINE 10 MG TABLET
ORAL_TABLET | Freq: Two times a day (BID) | ORAL | 0 refills | 0 days | Status: CP
Start: 2018-01-23 — End: 2018-02-26

## 2018-01-23 MED ORDER — DEXTROAMPHETAMINE-AMPHETAMINE ER 20 MG 24HR CAPSULE,EXTEND RELEASE
ORAL_CAPSULE | Freq: Every morning | ORAL | 0 refills | 0.00000 days | Status: CP
Start: 2018-01-23 — End: 2018-02-21

## 2018-02-22 MED ORDER — DEXTROAMPHETAMINE-AMPHETAMINE 10 MG TABLET
ORAL_TABLET | Freq: Two times a day (BID) | ORAL | 0 refills | 0 days | Status: CP
Start: 2018-02-22 — End: 2018-03-23

## 2018-02-22 MED ORDER — DEXTROAMPHETAMINE-AMPHETAMINE ER 20 MG 24HR CAPSULE,EXTEND RELEASE
ORAL_CAPSULE | Freq: Every morning | ORAL | 0 refills | 0.00000 days | Status: CP
Start: 2018-02-22 — End: 2018-02-26

## 2018-02-26 ENCOUNTER — Encounter
Admit: 2018-02-26 | Discharge: 2018-02-27 | Payer: PRIVATE HEALTH INSURANCE | Attending: Psychiatric/Mental Health | Primary: Psychiatric/Mental Health

## 2018-02-26 DIAGNOSIS — F419 Anxiety disorder, unspecified: Secondary | ICD-10-CM

## 2018-02-26 DIAGNOSIS — F902 Attention-deficit hyperactivity disorder, combined type: Secondary | ICD-10-CM

## 2018-02-26 DIAGNOSIS — F3340 Major depressive disorder, recurrent, in remission, unspecified: Principal | ICD-10-CM

## 2018-02-26 MED ORDER — CITALOPRAM 40 MG TABLET
ORAL_TABLET | Freq: Every day | ORAL | 3 refills | 0 days | Status: CP
Start: 2018-02-26 — End: 2018-04-24

## 2018-02-26 MED ORDER — DEXTROAMPHETAMINE-AMPHETAMINE ER 20 MG 24HR CAPSULE,EXTEND RELEASE
ORAL_CAPSULE | Freq: Every morning | ORAL | 0 refills | 0 days | Status: CP
Start: 2018-02-26 — End: 2018-03-27

## 2018-02-26 MED ORDER — DEXTROAMPHETAMINE-AMPHETAMINE 10 MG TABLET
ORAL_TABLET | Freq: Two times a day (BID) | ORAL | 0 refills | 0 days | Status: CP
Start: 2018-02-26 — End: 2018-03-27

## 2018-02-26 MED ORDER — HYDROXYZINE HCL 25 MG TABLET
ORAL_TABLET | Freq: Every day | ORAL | 1 refills | 0 days | Status: CP | PRN
Start: 2018-02-26 — End: 2018-08-18

## 2018-04-14 MED ORDER — ALBUTEROL SULFATE HFA 90 MCG/ACTUATION AEROSOL INHALER
Freq: Two times a day (BID) | RESPIRATORY_TRACT | 3 refills | 0.00000 days | Status: CP | PRN
Start: 2018-04-14 — End: ?

## 2018-04-25 MED ORDER — CITALOPRAM 40 MG TABLET
ORAL_TABLET | Freq: Every day | ORAL | 3 refills | 0.00000 days | Status: CP
Start: 2018-04-25 — End: 2018-05-19

## 2018-04-30 ENCOUNTER — Ambulatory Visit: Admit: 2018-04-30 | Discharge: 2018-05-01 | Payer: PRIVATE HEALTH INSURANCE

## 2018-04-30 DIAGNOSIS — J329 Chronic sinusitis, unspecified: Principal | ICD-10-CM

## 2018-04-30 DIAGNOSIS — F419 Anxiety disorder, unspecified: Principal | ICD-10-CM

## 2018-04-30 DIAGNOSIS — N946 Dysmenorrhea, unspecified: Principal | ICD-10-CM

## 2018-04-30 DIAGNOSIS — J45909 Unspecified asthma, uncomplicated: Principal | ICD-10-CM

## 2018-04-30 DIAGNOSIS — F988 Other specified behavioral and emotional disorders with onset usually occurring in childhood and adolescence: Principal | ICD-10-CM

## 2018-04-30 DIAGNOSIS — R05 Cough: Principal | ICD-10-CM

## 2018-04-30 DIAGNOSIS — F329 Major depressive disorder, single episode, unspecified: Principal | ICD-10-CM

## 2018-04-30 MED ORDER — FLUTICASONE PROPIONATE 220 MCG/ACTUATION HFA AEROSOL INHALER
Freq: Two times a day (BID) | RESPIRATORY_TRACT | 10 refills | 0 days | Status: CP
Start: 2018-04-30 — End: 2019-04-30

## 2018-04-30 MED ORDER — DOXYCYCLINE HYCLATE 100 MG TABLET
ORAL_TABLET | Freq: Two times a day (BID) | ORAL | 0 refills | 0.00000 days | Status: CP
Start: 2018-04-30 — End: 2018-05-07

## 2018-04-30 MED ORDER — PREDNISONE 10 MG TABLET
ORAL_TABLET | Freq: Every day | ORAL | 0 refills | 0 days | Status: CP
Start: 2018-04-30 — End: 2018-05-05

## 2018-05-19 MED ORDER — CITALOPRAM 40 MG TABLET
ORAL_TABLET | Freq: Every day | ORAL | 3 refills | 0 days | Status: CP
Start: 2018-05-19 — End: 2018-08-26

## 2018-05-19 MED ORDER — DEXTROAMPHETAMINE-AMPHETAMINE ER 20 MG 24HR CAPSULE,EXTEND RELEASE
ORAL_CAPSULE | Freq: Every morning | ORAL | 0 refills | 0.00000 days | Status: CP
Start: 2018-05-19 — End: 2018-06-17

## 2018-05-19 MED ORDER — DEXTROAMPHETAMINE-AMPHETAMINE 10 MG TABLET
ORAL_TABLET | Freq: Two times a day (BID) | ORAL | 0 refills | 0.00000 days | Status: CP
Start: 2018-05-19 — End: 2018-06-17

## 2018-06-18 MED ORDER — DEXTROAMPHETAMINE-AMPHETAMINE ER 20 MG 24HR CAPSULE,EXTEND RELEASE
ORAL_CAPSULE | Freq: Every morning | ORAL | 0 refills | 0 days | Status: CP
Start: 2018-06-18 — End: 2018-07-17

## 2018-06-18 MED ORDER — DEXTROAMPHETAMINE-AMPHETAMINE 10 MG TABLET
ORAL_TABLET | Freq: Two times a day (BID) | ORAL | 0 refills | 0.00000 days | Status: CP
Start: 2018-06-18 — End: 2018-07-17

## 2018-07-18 MED ORDER — DEXTROAMPHETAMINE-AMPHETAMINE 10 MG TABLET
ORAL_TABLET | Freq: Two times a day (BID) | ORAL | 0 refills | 0.00000 days | Status: CP
Start: 2018-07-18 — End: 2018-08-18

## 2018-07-18 MED ORDER — DEXTROAMPHETAMINE-AMPHETAMINE ER 20 MG 24HR CAPSULE,EXTEND RELEASE
ORAL_CAPSULE | Freq: Every morning | ORAL | 0 refills | 0 days | Status: CP
Start: 2018-07-18 — End: 2018-08-18

## 2018-08-18 MED ORDER — DEXTROAMPHETAMINE-AMPHETAMINE ER 20 MG 24HR CAPSULE,EXTEND RELEASE
ORAL_CAPSULE | Freq: Every morning | ORAL | 0 refills | 0.00000 days | Status: CP
Start: 2018-08-18 — End: 2018-09-16

## 2018-08-18 MED ORDER — HYDROXYZINE HCL 25 MG TABLET
ORAL_TABLET | Freq: Every day | ORAL | 0 refills | 0.00000 days | Status: CP | PRN
Start: 2018-08-18 — End: 2018-08-26

## 2018-08-18 MED ORDER — DEXTROAMPHETAMINE-AMPHETAMINE 10 MG TABLET
ORAL_TABLET | Freq: Two times a day (BID) | ORAL | 0 refills | 0 days | Status: CP
Start: 2018-08-18 — End: 2018-09-16

## 2018-08-26 ENCOUNTER — Encounter
Admit: 2018-08-26 | Discharge: 2018-08-27 | Payer: PRIVATE HEALTH INSURANCE | Attending: Psychiatric/Mental Health | Primary: Psychiatric/Mental Health

## 2018-08-26 DIAGNOSIS — F419 Anxiety disorder, unspecified: Secondary | ICD-10-CM

## 2018-08-26 DIAGNOSIS — F902 Attention-deficit hyperactivity disorder, combined type: Principal | ICD-10-CM

## 2018-08-26 DIAGNOSIS — F3340 Major depressive disorder, recurrent, in remission, unspecified: Secondary | ICD-10-CM

## 2018-08-26 MED ORDER — HYDROXYZINE HCL 25 MG TABLET
ORAL_TABLET | Freq: Every day | ORAL | 0 refills | 30 days | Status: CP | PRN
Start: 2018-08-26 — End: 2018-09-25

## 2018-08-26 MED ORDER — CITALOPRAM 40 MG TABLET
ORAL_TABLET | Freq: Every day | ORAL | 3 refills | 90 days | Status: CP
Start: 2018-08-26 — End: 2019-08-26

## 2018-08-26 MED ORDER — DEXTROAMPHETAMINE-AMPHETAMINE ER 20 MG 24HR CAPSULE,EXTEND RELEASE
ORAL_CAPSULE | Freq: Every morning | ORAL | 0 refills | 30.00000 days | Status: CP
Start: 2018-08-26 — End: 2018-09-24

## 2018-08-26 MED ORDER — DEXTROAMPHETAMINE-AMPHETAMINE 10 MG TABLET
ORAL_TABLET | Freq: Two times a day (BID) | ORAL | 0 refills | 30 days | Status: CP
Start: 2018-08-26 — End: 2018-09-24

## 2018-09-16 MED ORDER — DEXTROAMPHETAMINE-AMPHETAMINE 10 MG TABLET
ORAL_TABLET | Freq: Two times a day (BID) | ORAL | 0 refills | 30 days | Status: CP
Start: 2018-09-16 — End: 2018-10-15

## 2018-09-16 MED ORDER — DEXTROAMPHETAMINE-AMPHETAMINE ER 20 MG 24HR CAPSULE,EXTEND RELEASE
ORAL_CAPSULE | Freq: Every morning | ORAL | 0 refills | 30.00000 days | Status: CP
Start: 2018-09-16 — End: 2018-10-15

## 2018-09-25 MED ORDER — DEXTROAMPHETAMINE-AMPHETAMINE ER 20 MG 24HR CAPSULE,EXTEND RELEASE
ORAL_CAPSULE | Freq: Every morning | ORAL | 0 refills | 30.00000 days | Status: CP
Start: 2018-09-25 — End: 2018-10-24

## 2018-09-25 MED ORDER — DEXTROAMPHETAMINE-AMPHETAMINE 10 MG TABLET
ORAL_TABLET | Freq: Two times a day (BID) | ORAL | 0 refills | 30 days | Status: CP
Start: 2018-09-25 — End: 2018-10-24

## 2018-10-25 MED ORDER — DEXTROAMPHETAMINE-AMPHETAMINE ER 20 MG 24HR CAPSULE,EXTEND RELEASE
ORAL_CAPSULE | Freq: Every morning | ORAL | 0 refills | 30 days | Status: CP
Start: 2018-10-25 — End: 2018-11-23

## 2018-10-25 MED ORDER — DEXTROAMPHETAMINE-AMPHETAMINE 10 MG TABLET
ORAL_TABLET | Freq: Two times a day (BID) | ORAL | 0 refills | 30.00000 days | Status: CP
Start: 2018-10-25 — End: 2018-11-23

## 2018-11-26 ENCOUNTER — Telehealth
Admit: 2018-11-26 | Discharge: 2018-11-27 | Payer: PRIVATE HEALTH INSURANCE | Attending: Psychiatric/Mental Health | Primary: Psychiatric/Mental Health

## 2018-11-28 MED ORDER — DEXTROAMPHETAMINE-AMPHETAMINE 10 MG TABLET: 10 mg | tablet | Freq: Two times a day (BID) | 0 refills | 30 days | Status: AC

## 2018-11-28 MED ORDER — DEXTROAMPHETAMINE-AMPHETAMINE ER 20 MG 24HR CAPSULE,EXTEND RELEASE: 20 mg | capsule | Freq: Every morning | 0 refills | 30 days | Status: AC

## 2019-02-25 ENCOUNTER — Encounter
Admit: 2019-02-25 | Discharge: 2019-02-26 | Payer: PRIVATE HEALTH INSURANCE | Attending: Psychiatric/Mental Health | Primary: Psychiatric/Mental Health

## 2019-02-25 MED ORDER — DEXTROAMPHETAMINE-AMPHETAMINE 10 MG TABLET
ORAL_TABLET | Freq: Two times a day (BID) | ORAL | 0 refills | 30 days | Status: CP | PRN
Start: 2019-02-25 — End: 2019-03-27

## 2019-02-25 MED ORDER — LISDEXAMFETAMINE 40 MG CAPSULE
ORAL_CAPSULE | Freq: Every morning | ORAL | 0 refills | 30 days | Status: CP
Start: 2019-02-25 — End: 2019-03-26

## 2019-03-25 MED ORDER — DEXTROAMPHETAMINE-AMPHETAMINE 10 MG TABLET
ORAL_TABLET | Freq: Two times a day (BID) | ORAL | 0 refills | 30.00000 days | Status: CP | PRN
Start: 2019-03-25 — End: 2019-04-24

## 2019-03-27 MED ORDER — LISDEXAMFETAMINE 40 MG CAPSULE
ORAL_CAPSULE | Freq: Every morning | ORAL | 0 refills | 30.00000 days | Status: CP
Start: 2019-03-27 — End: 2019-04-25

## 2019-04-15 MED ORDER — FLUTICASONE PROPIONATE 220 MCG/ACTUATION HFA AEROSOL INHALER
Freq: Two times a day (BID) | RESPIRATORY_TRACT | 0 refills | 34 days | Status: CP
Start: 2019-04-15 — End: 2020-04-14

## 2019-04-17 MED ORDER — DEXTROAMPHETAMINE-AMPHETAMINE 10 MG TABLET
ORAL_TABLET | Freq: Two times a day (BID) | ORAL | 0 refills | 30 days | Status: CP | PRN
Start: 2019-04-17 — End: 2019-05-17

## 2019-04-18 MED ORDER — DEXTROAMPHETAMINE-AMPHETAMINE 10 MG TABLET
ORAL_TABLET | Freq: Two times a day (BID) | ORAL | 0 refills | 30.00000 days | Status: CP | PRN
Start: 2019-04-18 — End: 2019-05-18

## 2019-04-26 MED ORDER — LISDEXAMFETAMINE 40 MG CAPSULE
ORAL_CAPSULE | Freq: Every morning | ORAL | 0 refills | 30 days | Status: CP
Start: 2019-04-26 — End: 2019-05-25

## 2019-04-30 ENCOUNTER — Ambulatory Visit: Payer: Self-pay

## 2019-05-27 ENCOUNTER — Encounter
Admit: 2019-05-27 | Discharge: 2019-05-28 | Payer: PRIVATE HEALTH INSURANCE | Attending: Psychiatric/Mental Health | Primary: Psychiatric/Mental Health

## 2019-05-27 MED ORDER — CITALOPRAM 40 MG TABLET
ORAL_TABLET | Freq: Every day | ORAL | 3 refills | 90 days | Status: CP
Start: 2019-05-27 — End: 2020-05-26

## 2019-05-27 MED ORDER — DEXTROAMPHETAMINE-AMPHETAMINE 10 MG TABLET
ORAL_TABLET | Freq: Two times a day (BID) | ORAL | 0 refills | 30.00000 days | Status: CP | PRN
Start: 2019-05-27 — End: 2019-06-26

## 2019-05-27 MED ORDER — HYDROXYZINE HCL 25 MG TABLET
ORAL_TABLET | Freq: Every day | ORAL | 1 refills | 90.00000 days | Status: CP | PRN
Start: 2019-05-27 — End: 2019-11-23

## 2019-05-27 MED ORDER — LISDEXAMFETAMINE 50 MG CAPSULE
ORAL_CAPSULE | Freq: Every morning | ORAL | 0 refills | 30 days | Status: CP
Start: 2019-05-27 — End: 2019-06-25

## 2019-06-24 MED ORDER — DEXTROAMPHETAMINE-AMPHETAMINE 10 MG TABLET
ORAL_TABLET | Freq: Two times a day (BID) | ORAL | 0 refills | 30.00000 days | Status: CP | PRN
Start: 2019-06-24 — End: 2019-07-24

## 2019-06-26 MED ORDER — LISDEXAMFETAMINE 50 MG CAPSULE
ORAL_CAPSULE | Freq: Every morning | ORAL | 0 refills | 30 days | Status: CP
Start: 2019-06-26 — End: 2019-07-25

## 2019-07-20 MED ORDER — ALBUTEROL SULFATE HFA 90 MCG/ACTUATION AEROSOL INHALER
Freq: Two times a day (BID) | RESPIRATORY_TRACT | 0 refills | 0.00000 days | Status: CP | PRN
Start: 2019-07-20 — End: ?

## 2019-07-23 MED ORDER — DEXTROAMPHETAMINE-AMPHETAMINE 10 MG TABLET
ORAL_TABLET | Freq: Two times a day (BID) | ORAL | 0 refills | 30 days | Status: CP | PRN
Start: 2019-07-23 — End: 2019-08-22

## 2019-07-26 MED ORDER — LISDEXAMFETAMINE 50 MG CAPSULE
ORAL_CAPSULE | Freq: Every morning | ORAL | 0 refills | 30 days | Status: CP
Start: 2019-07-26 — End: 2019-08-24

## 2019-08-12 ENCOUNTER — Encounter: Admit: 2019-08-12 | Discharge: 2019-08-13 | Payer: PRIVATE HEALTH INSURANCE

## 2019-08-12 ENCOUNTER — Ambulatory Visit: Admit: 2019-08-12 | Discharge: 2019-08-13 | Payer: PRIVATE HEALTH INSURANCE

## 2019-08-12 DIAGNOSIS — J4520 Mild intermittent asthma, uncomplicated: Principal | ICD-10-CM

## 2019-08-12 DIAGNOSIS — J329 Chronic sinusitis, unspecified: Principal | ICD-10-CM

## 2019-08-12 DIAGNOSIS — M533 Sacrococcygeal disorders, not elsewhere classified: Principal | ICD-10-CM

## 2019-08-12 DIAGNOSIS — Z124 Encounter for screening for malignant neoplasm of cervix: Principal | ICD-10-CM

## 2019-08-12 DIAGNOSIS — Z803 Family history of malignant neoplasm of breast: Principal | ICD-10-CM

## 2019-08-12 DIAGNOSIS — Z0000 Encounter for general adult medical examination without abnormal findings: Principal | ICD-10-CM

## 2019-08-12 MED ORDER — BUDESONIDE-FORMOTEROL HFA 160 MCG-4.5 MCG/ACTUATION AEROSOL INHALER
Freq: Two times a day (BID) | RESPIRATORY_TRACT | 3 refills | 27 days | Status: CP
Start: 2019-08-12 — End: 2020-08-11

## 2019-08-12 MED ORDER — GABAPENTIN 300 MG CAPSULE
ORAL_CAPSULE | 3 refills | 0 days | Status: CP
Start: 2019-08-12 — End: ?

## 2019-08-28 DIAGNOSIS — T148XXA Other injury of unspecified body region, initial encounter: Principal | ICD-10-CM

## 2019-08-30 MED ORDER — CYCLOBENZAPRINE 5 MG TABLET
ORAL_TABLET | Freq: Three times a day (TID) | ORAL | 0 refills | 20.00000 days | Status: CP
Start: 2019-08-30 — End: ?

## 2019-09-02 ENCOUNTER — Ambulatory Visit: Admit: 2019-09-02 | Discharge: 2019-09-03 | Payer: PRIVATE HEALTH INSURANCE

## 2019-09-02 MED ORDER — PREDNISONE 10 MG TABLET
ORAL_TABLET | 0 refills | 0 days | Status: CP
Start: 2019-09-02 — End: ?

## 2019-09-02 MED ORDER — DOXYCYCLINE HYCLATE 100 MG TABLET
ORAL_TABLET | Freq: Two times a day (BID) | ORAL | 0 refills | 21 days | Status: CP
Start: 2019-09-02 — End: 2019-09-23

## 2019-09-13 MED ORDER — DEXTROAMPHETAMINE-AMPHETAMINE 10 MG TABLET
ORAL_TABLET | Freq: Two times a day (BID) | ORAL | 0 refills | 30 days | Status: CP | PRN
Start: 2019-09-13 — End: 2019-10-13

## 2019-09-13 MED ORDER — LISDEXAMFETAMINE 50 MG CAPSULE
ORAL_CAPSULE | Freq: Every morning | ORAL | 0 refills | 30 days | Status: CP
Start: 2019-09-13 — End: 2019-10-12

## 2019-10-14 MED ORDER — LISDEXAMFETAMINE 50 MG CAPSULE
ORAL_CAPSULE | Freq: Every morning | ORAL | 0 refills | 30.00000 days | Status: CP
Start: 2019-10-14 — End: 2019-11-12

## 2019-10-14 MED ORDER — DEXTROAMPHETAMINE-AMPHETAMINE 10 MG TABLET
ORAL_TABLET | Freq: Two times a day (BID) | ORAL | 0 refills | 30 days | Status: CP | PRN
Start: 2019-10-14 — End: 2019-11-13

## 2019-11-11 MED ORDER — DEXTROAMPHETAMINE-AMPHETAMINE 10 MG TABLET
ORAL_TABLET | Freq: Two times a day (BID) | ORAL | 0 refills | 30 days | Status: CP | PRN
Start: 2019-11-11 — End: 2019-12-11

## 2019-11-11 MED ORDER — LISDEXAMFETAMINE 50 MG CAPSULE
ORAL_CAPSULE | Freq: Every morning | ORAL | 0 refills | 30 days | Status: CP
Start: 2019-11-11 — End: 2019-12-10

## 2019-11-30 DIAGNOSIS — J4531 Mild persistent asthma with (acute) exacerbation: Principal | ICD-10-CM

## 2019-12-07 MED ORDER — DEXTROAMPHETAMINE-AMPHETAMINE 10 MG TABLET
ORAL_TABLET | Freq: Two times a day (BID) | ORAL | 0 refills | 30 days | Status: CP | PRN
Start: 2019-12-07 — End: 2020-01-06

## 2019-12-09 ENCOUNTER — Telehealth
Admit: 2019-12-09 | Discharge: 2019-12-10 | Payer: PRIVATE HEALTH INSURANCE | Attending: Psychiatric/Mental Health | Primary: Psychiatric/Mental Health

## 2019-12-09 MED ORDER — HYDROXYZINE HCL 25 MG TABLET
ORAL_TABLET | Freq: Every day | ORAL | 1 refills | 90 days | Status: CP | PRN
Start: 2019-12-09 — End: 2020-06-06

## 2019-12-09 MED ORDER — DEXTROAMPHETAMINE-AMPHETAMINE 10 MG TABLET
ORAL_TABLET | Freq: Two times a day (BID) | ORAL | 0 refills | 30.00000 days | Status: CP | PRN
Start: 2019-12-09 — End: 2020-01-08

## 2019-12-09 MED ORDER — LISDEXAMFETAMINE 50 MG CAPSULE
ORAL_CAPSULE | Freq: Every morning | ORAL | 0 refills | 30.00000 days | Status: CP
Start: 2019-12-09 — End: 2020-01-07

## 2019-12-30 ENCOUNTER — Encounter: Payer: Self-pay | Admitting: Emergency Medicine

## 2019-12-30 ENCOUNTER — Ambulatory Visit
Admission: EM | Admit: 2019-12-30 | Discharge: 2019-12-30 | Disposition: A | Discharge status: Home / Self Care | Payer: Medicaid Other | Attending: Family Medicine | Admitting: Family Medicine

## 2019-12-30 ENCOUNTER — Other Ambulatory Visit: Payer: Self-pay

## 2019-12-30 DIAGNOSIS — J45901 Unspecified asthma with (acute) exacerbation: Secondary | ICD-10-CM

## 2019-12-30 DIAGNOSIS — Z7951 Long term (current) use of inhaled steroids: Secondary | ICD-10-CM | POA: Diagnosis not present

## 2019-12-30 DIAGNOSIS — R059 Cough, unspecified: Secondary | ICD-10-CM | POA: Insufficient documentation

## 2019-12-30 DIAGNOSIS — Z20822 Contact with and (suspected) exposure to covid-19: Secondary | ICD-10-CM | POA: Insufficient documentation

## 2019-12-30 DIAGNOSIS — Z79899 Other long term (current) drug therapy: Secondary | ICD-10-CM | POA: Diagnosis not present

## 2019-12-30 DIAGNOSIS — Z87891 Personal history of nicotine dependence: Secondary | ICD-10-CM | POA: Diagnosis not present

## 2019-12-30 LAB — RESP PANEL BY RT PCR (RSV, FLU A&B, COVID)
Influenza A by PCR: NEGATIVE
Influenza B by PCR: NEGATIVE
Respiratory Syncytial Virus by PCR: NEGATIVE
SARS Coronavirus 2 by RT PCR: NEGATIVE

## 2019-12-30 MED ORDER — BENZONATATE 200 MG PO CAPS: 200.0000 mg | ORAL_CAPSULE | Freq: Three times a day (TID) | ORAL | 0 refills | Status: DC | PRN

## 2019-12-30 MED ORDER — PREDNISONE 10 MG PO TABS: ORAL_TABLET | ORAL | 0 refills | Status: DC
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
## 2019-12-30 NOTE — Discharge Instructions (Signed)
Medications as prescribed.  Continue Albuterol 2 puffs every 6 hours as needed.  Continue Flovent as prescribed.  Take care  Dr. Adriana Simas

## 2019-12-30 NOTE — ED Triage Notes (Signed)
Patient states she has asthma and has a history of bronchitis. She is c/o cough, nasal congestion that started 1 week ago.

## 2020-01-08 MED ORDER — LISDEXAMFETAMINE 50 MG CAPSULE
ORAL_CAPSULE | Freq: Every morning | ORAL | 0 refills | 30.00000 days | Status: CP
Start: 2020-01-08 — End: 2020-02-06

## 2020-01-12 MED ORDER — LISDEXAMFETAMINE 50 MG CAPSULE
ORAL_CAPSULE | Freq: Every morning | ORAL | 0 refills | 30 days | Status: CP
Start: 2020-01-12 — End: 2020-02-10

## 2020-01-12 MED ORDER — DEXTROAMPHETAMINE-AMPHETAMINE 10 MG TABLET
ORAL_TABLET | Freq: Two times a day (BID) | ORAL | 0 refills | 30 days | Status: CP | PRN
Start: 2020-01-12 — End: 2020-02-11

## 2020-01-29 MED ORDER — ALBUTEROL SULFATE HFA 90 MCG/ACTUATION AEROSOL INHALER
Freq: Two times a day (BID) | RESPIRATORY_TRACT | 0 refills | 0 days | Status: CP | PRN
Start: 2020-01-29 — End: 2020-03-22

## 2020-02-01 MED ORDER — DEXTROAMPHETAMINE-AMPHETAMINE 10 MG TABLET
ORAL_TABLET | Freq: Two times a day (BID) | ORAL | 0 refills | 30 days | Status: CP | PRN
Start: 2020-02-01 — End: 2020-03-02

## 2020-02-07 MED ORDER — LISDEXAMFETAMINE 50 MG CAPSULE
ORAL_CAPSULE | Freq: Every morning | ORAL | 0 refills | 30.00000 days | Status: CP
Start: 2020-02-07 — End: 2020-03-07

## 2020-02-09 ENCOUNTER — Ambulatory Visit (INDEPENDENT_AMBULATORY_CARE_PROVIDER_SITE_OTHER): Payer: Medicaid Other

## 2020-02-09 ENCOUNTER — Ambulatory Visit
Admission: EM | Admit: 2020-02-09 | Discharge: 2020-02-09 | Disposition: A | Discharge status: Home / Self Care | Payer: Medicaid Other | Attending: Emergency Medicine | Admitting: Emergency Medicine

## 2020-02-09 ENCOUNTER — Other Ambulatory Visit: Payer: Self-pay

## 2020-02-09 DIAGNOSIS — Z87891 Personal history of nicotine dependence: Secondary | ICD-10-CM | POA: Diagnosis not present

## 2020-02-09 DIAGNOSIS — J45901 Unspecified asthma with (acute) exacerbation: Secondary | ICD-10-CM | POA: Diagnosis not present

## 2020-02-09 DIAGNOSIS — F329 Major depressive disorder, single episode, unspecified: Secondary | ICD-10-CM | POA: Insufficient documentation

## 2020-02-09 DIAGNOSIS — R0602 Shortness of breath: Secondary | ICD-10-CM

## 2020-02-09 DIAGNOSIS — R062 Wheezing: Secondary | ICD-10-CM

## 2020-02-09 DIAGNOSIS — R059 Cough, unspecified: Secondary | ICD-10-CM | POA: Diagnosis not present

## 2020-02-09 DIAGNOSIS — Z79899 Other long term (current) drug therapy: Secondary | ICD-10-CM | POA: Insufficient documentation

## 2020-02-09 DIAGNOSIS — Z20822 Contact with and (suspected) exposure to covid-19: Secondary | ICD-10-CM | POA: Diagnosis not present

## 2020-02-09 DIAGNOSIS — F419 Anxiety disorder, unspecified: Secondary | ICD-10-CM | POA: Insufficient documentation

## 2020-02-09 LAB — RESP PANEL BY RT-PCR (FLU A&B, COVID) ARPGX2
Influenza A by PCR: NEGATIVE
Influenza B by PCR: NEGATIVE
SARS Coronavirus 2 by RT PCR: NEGATIVE

## 2020-02-09 MED ORDER — PREDNISONE 10 MG PO TABS: ORAL_TABLET | ORAL | 0 refills | Status: DC
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
## 2020-02-09 NOTE — Discharge Instructions (Addendum)
Negative COVID and flu test. At this time continue your inhalers.  I sent prednisone and also cough syrup.  Keep appointment asthma and allergy specialist tomorrow if your Covid test is negative.  You have received COVID testing today either for positive exposure, concerning symptoms that could be related to COVID infection, screening purposes, or re-testing after confirmed positive.  Your test obtained today checks for active viral infection in the last 1-2 weeks. If your test is negative now, you can still test positive later. So, if you do develop symptoms you should either get re-tested and/or isolate x 10 days. Please follow CDC guidelines.  While Rapid antigen tests come back in 15-20 minutes, send out PCR/molecular test results typically come back within 24 hours. In the mean time, if you are symptomatic, assume this could be a positive test and treat/monitor yourself as if you do have COVID.   We will call with test results. Please download the MyChart app and set up a profile to access test results.   If symptomatic, go home and rest. Push fluids. Take Tylenol as needed for discomfort. Gargle warm salt water. Throat lozenges. Take Mucinex DM or Robitussin for cough. Humidifier in bedroom to ease coughing. Warm showers. Also review the COVID handout for more information.  COVID-19 INFECTION: The incubation period of COVID-19 is approximately 14 days after exposure, with most symptoms developing in roughly 4-5 days. Symptoms may range in severity from mild to critically severe. Roughly 80% of those infected will have mild symptoms. People of any age may become infected with COVID-19 and have the ability to transmit the virus. The most common symptoms include: fever, fatigue, cough, body aches, headaches, sore throat, nasal congestion, shortness of breath, nausea, vomiting, diarrhea, changes in smell and/or taste.    COURSE OF ILLNESS Some patients may begin with mild disease which can progress  quickly into critical symptoms. If your symptoms are worsening please call ahead to the Emergency Department and proceed there for further treatment. Recovery time appears to be roughly 1-2 weeks for mild symptoms and 3-6 weeks for severe disease.   GO IMMEDIATELY TO ER FOR FEVER YOU ARE UNABLE TO GET DOWN WITH TYLENOL, BREATHING PROBLEMS, CHEST PAIN, FATIGUE, LETHARGY, INABILITY TO EAT OR DRINK, ETC  QUARANTINE AND ISOLATION: To help decrease the spread of COVID-19 please remain isolated if you have COVID infection or are highly suspected to have COVID infection. This means -stay home and isolate to one room in the home if you live with others. Do not share a bed or bathroom with others while ill, sanitize and wipe down all countertops and keep common areas clean and disinfected. You may discontinue isolation if you have a mild case and are asymptomatic 10 days after symptom onset as long as you have been fever free >24 hours without having to take Motrin or Tylenol. If your case is more severe (meaning you develop pneumonia or are admitted in the hospital), you may have to isolate longer.   If you have been in close contact (within 6 feet) of someone diagnosed with COVID 19, you are advised to quarantine in your home for 14 days as symptoms can develop anywhere from 2-14 days after exposure to the virus. If you develop symptoms, you  must isolate.  Most current guidelines for COVID after exposure -isolate 10 days if you ARE NOT tested for COVID as long as symptoms do not develop -isolate 7 days if you are tested and remain asymptomatic -You do not  necessarily need to be tested for COVID if you have + exposure and        develop   symptoms. Just isolate at home x10 days from symptom onset During this global pandemic, CDC advises to practice social distancing, try to stay at least 55ft away from others at all times. Wear a face covering. Wash and sanitize your hands regularly and avoid going anywhere that  is not necessary.  KEEP IN MIND THAT THE COVID TEST IS NOT 100% ACCURATE AND YOU SHOULD STILL DO EVERYTHING TO PREVENT POTENTIAL SPREAD OF VIRUS TO OTHERS (WEAR MASK, WEAR GLOVES, WASH HANDS AND SANITIZE REGULARLY). IF INITIAL TEST IS NEGATIVE, THIS MAY NOT MEAN YOU ARE DEFINITELY NEGATIVE. MOST ACCURATE TESTING IS DONE 5-7 DAYS AFTER EXPOSURE.   It is not advised by CDC to get re-tested after receiving a positive COVID test since you can still test positive for weeks to months after you have already cleared the virus.   *If you have not been vaccinated for COVID, I strongly suggest you consider getting vaccinated as long as there are no contraindications.

## 2020-02-09 NOTE — ED Triage Notes (Signed)
Pt c/o non-productive cough, congestion, runny nose for approx 1 week, body aches, SOB. Has been using nebulizer tx x4/day with some improvement. Last used neb tx at approx 0730  Denies fever, n/v/d, sore throat, ear pain, loss of taste/smell.   Has been bronchaid OTC oral medication-last taken yesterday. Using tessalon pearls w/o significant improvement to cough.   Reports out of her steroid inhaler. Insp wheezes auscultated bilaterally.

## 2020-02-10 ENCOUNTER — Ambulatory Visit: Admit: 2020-02-10 | Discharge: 2020-02-11 | Payer: PRIVATE HEALTH INSURANCE

## 2020-02-10 DIAGNOSIS — J329 Chronic sinusitis, unspecified: Principal | ICD-10-CM

## 2020-02-10 DIAGNOSIS — Z7951 Long term (current) use of inhaled steroids: Principal | ICD-10-CM

## 2020-02-10 DIAGNOSIS — E669 Obesity, unspecified: Principal | ICD-10-CM

## 2020-02-10 DIAGNOSIS — R0683 Snoring: Principal | ICD-10-CM

## 2020-02-10 DIAGNOSIS — K219 Gastro-esophageal reflux disease without esophagitis: Principal | ICD-10-CM

## 2020-02-10 DIAGNOSIS — Z23 Encounter for immunization: Principal | ICD-10-CM

## 2020-02-10 DIAGNOSIS — J45909 Unspecified asthma, uncomplicated: Principal | ICD-10-CM

## 2020-02-10 DIAGNOSIS — F17200 Nicotine dependence, unspecified, uncomplicated: Principal | ICD-10-CM

## 2020-02-10 DIAGNOSIS — J4531 Mild persistent asthma with (acute) exacerbation: Principal | ICD-10-CM

## 2020-02-10 DIAGNOSIS — J42 Unspecified chronic bronchitis: Principal | ICD-10-CM

## 2020-02-10 DIAGNOSIS — G4733 Obstructive sleep apnea (adult) (pediatric): Principal | ICD-10-CM

## 2020-02-10 MED ORDER — FLUTICASONE PROPIONATE 230 MCG-SALMETEROL 21 MCG/ACTUATION HFA INHALER
Freq: Two times a day (BID) | RESPIRATORY_TRACT | 3 refills | 0.00000 days | Status: CP
Start: 2020-02-10 — End: 2021-02-09

## 2020-02-10 MED ORDER — FAMOTIDINE 20 MG TABLET
ORAL_TABLET | Freq: Every day | ORAL | 1 refills | 30.00000 days | Status: CP
Start: 2020-02-10 — End: 2021-02-09

## 2020-02-12 MED ORDER — DEXTROAMPHETAMINE-AMPHETAMINE 10 MG TABLET
ORAL_TABLET | Freq: Two times a day (BID) | ORAL | 0 refills | 30 days | Status: CP | PRN
Start: 2020-02-12 — End: 2020-03-13

## 2020-02-12 MED ORDER — LISDEXAMFETAMINE 50 MG CAPSULE
ORAL_CAPSULE | Freq: Every morning | ORAL | 0 refills | 30 days | Status: CP
Start: 2020-02-12 — End: 2020-03-12

## 2020-03-08 ENCOUNTER — Telehealth
Admit: 2020-03-08 | Discharge: 2020-03-09 | Payer: PRIVATE HEALTH INSURANCE | Attending: Psychiatric/Mental Health | Primary: Psychiatric/Mental Health

## 2020-03-13 MED ORDER — LISDEXAMFETAMINE 50 MG CAPSULE
ORAL_CAPSULE | Freq: Every morning | ORAL | 0 refills | 30 days | Status: CP
Start: 2020-03-13 — End: 2020-04-11

## 2020-03-14 MED ORDER — DEXTROAMPHETAMINE-AMPHETAMINE 10 MG TABLET
ORAL_TABLET | Freq: Two times a day (BID) | ORAL | 0 refills | 30 days | Status: CP | PRN
Start: 2020-03-14 — End: 2020-04-13

## 2020-03-22 DIAGNOSIS — T148XXA Other injury of unspecified body region, initial encounter: Principal | ICD-10-CM

## 2020-03-22 MED ORDER — CYCLOBENZAPRINE 5 MG TABLET
ORAL_TABLET | Freq: Three times a day (TID) | ORAL | 0 refills | 20 days
Start: 2020-03-22 — End: ?

## 2020-03-22 MED ORDER — ALBUTEROL SULFATE HFA 90 MCG/ACTUATION AEROSOL INHALER
Freq: Two times a day (BID) | RESPIRATORY_TRACT | 5 refills | 0 days | Status: CP | PRN
Start: 2020-03-22 — End: ?

## 2020-03-24 ENCOUNTER — Telehealth: Admit: 2020-03-24 | Discharge: 2020-03-25 | Payer: PRIVATE HEALTH INSURANCE | Attending: Children | Primary: Children

## 2020-03-24 DIAGNOSIS — Z803 Family history of malignant neoplasm of breast: Principal | ICD-10-CM

## 2020-03-24 DIAGNOSIS — J45909 Unspecified asthma, uncomplicated: Principal | ICD-10-CM

## 2020-03-28 ENCOUNTER — Ambulatory Visit: Admit: 2020-03-28 | Discharge: 2020-03-29 | Payer: PRIVATE HEALTH INSURANCE

## 2020-03-28 DIAGNOSIS — J4551 Severe persistent asthma with (acute) exacerbation: Principal | ICD-10-CM

## 2020-03-28 MED ORDER — MONTELUKAST 10 MG TABLET
ORAL_TABLET | Freq: Every evening | ORAL | 11 refills | 30 days | Status: CP
Start: 2020-03-28 — End: 2021-03-28

## 2020-03-29 MED ORDER — EPINEPHRINE 0.3 MG/0.3 ML INJECTION, AUTO-INJECTOR
Freq: Once | INTRAMUSCULAR | 0 refills | 1 days | Status: CP
Start: 2020-03-29 — End: 2020-03-29
  Filled 2020-04-11: qty 2, 2d supply, fill #0

## 2020-03-29 MED ORDER — OMALIZUMAB 150 MG/ML SUBCUTANEOUS SYRINGE
SUBCUTANEOUS | 0 refills | 28 days | Status: CP
Start: 2020-03-29 — End: ?

## 2020-03-29 MED ORDER — OMALIZUMAB 75 MG/0.5 ML SUBCUTANEOUS SYRINGE
SUBCUTANEOUS | 0 refills | 56 days | Status: CP
Start: 2020-03-29 — End: ?

## 2020-03-30 DIAGNOSIS — J4551 Severe persistent asthma with (acute) exacerbation: Principal | ICD-10-CM

## 2020-04-04 LAB — ASPERGILLUS SPECIFIC PRECIPITINS
A. FUM IGE CLASS: 0
A. FUMIGATUS IGE: <0.1 kU/L
A. FUMIGATUS MGD: NEGATIVE
TOTAL IGE (ASP. PRECIP): 265 [IU]/mL — ABNORMAL HIGH

## 2020-04-05 NOTE — Unmapped (Signed)
[facility] Specialty Medication Onboarding    Specialty Medication: Xolair 75 mg and 150 mg syringes  Prior Authorization: Approved   Financial Assistance: No - copay  <$25  Final Copay/Day Supply: $0 / 28    Insurance Restrictions: None     Notes to Pharmacist: None    The triage team has completed the benefits investigation and has determined that the patient is able to fill this medication at [facility]. Please contact the patient to complete the onboarding or follow up with the prescribing physician as needed.

## 2020-04-06 DIAGNOSIS — J4551 Severe persistent asthma with (acute) exacerbation: Principal | ICD-10-CM

## 2020-04-06 MED ORDER — OMALIZUMAB 75 MG/0.5 ML SUBCUTANEOUS SYRINGE
SUBCUTANEOUS | 12 refills | 28.00000 days | Status: CP
Start: 2020-04-06 — End: 2020-04-06
  Filled 2020-04-11: qty 1, 28d supply, fill #0

## 2020-04-06 MED ORDER — OMALIZUMAB 150 MG/ML SUBCUTANEOUS SYRINGE
SUBCUTANEOUS | 12 refills | 28.00000 days | Status: CP
Start: 2020-04-06 — End: 2020-05-04

## 2020-04-06 NOTE — Unmapped (Unsigned)
[facility]   Patient Onboarding/Medication Counseling    First 3 doses in clinic, then will be clinic admin.       Jamie Webb is a 30 y.o. female with severe persistent asthma who I am counseling today on initiation of therapy.  I am speaking to the patient.    Was a Nurse, learning disability used for this call? No    Verified patient's date of birth / HIPAA.    Specialty medication(s) to be sent: CF/Pulmonary: -Xolair 150mg  and 75mg       Non-specialty medications/supplies to be sent: Epi-Pen      Medications not needed at this time: n/a         Xolair Syringes (omalizumab)    Medication & Administration     Dosage: Inject 225mg  under the skin every 14 days    Administration:     Prefilled Syringe:   Home administration screening questions:    Has patient ever had an anaphylaxis reaction to Xolair or other agents, such as foods, drugs, biologics, etc? Not sure yet, will get first 3 doses in clinic    Has patient received at least 3 doses in clinic under the supervision of a healthcare proefssional? No - medication will be administered in clinic by a healthcare professional    Does the patient have an Epi-pen to use for possible anaphylaxis reaction? No - medication will be administered in clinic by a healthcare professional    Injection administration:   ??? Gather all supplies needed for injection on a clean, flat working surface: medication syringe(s) removed from packaging, alcohol swab, sharps container, etc.  ??? Look at the medication label ??? look for correct medication, correct dose, and check the expiration date  ??? Look at the medication ??? the liquid in the syringe should appear clear and colorless to slightly yellow, you may see a few white particles  ??? Lay the syringe on a flat surface and allow it to warm up to room temperature for at least 15-30 minutes  ??? Select injection site ??? you can use the front of your thigh or your belly (but not the area 2 inches around your belly button); if someone else is giving you the injection you can also use your upper arm in the skin covering your triceps muscle or in the buttocks  ??? Prepare injection site ??? wash your hands and clean the skin at the injection site with an alcohol swab and let it air dry, do not touch the injection site again before the injection  ??? Pull off the needle safety cap, do not remove until immediately prior to injection  ??? Pinch the skin ??? with your hand not holding the syringe pinch up a fold of skin at the injection site using your forefinger and thumb  ??? Insert the needle into the fold of skin at about a 45 degree angle ??? it's best to use a quick dart-like motion  ??? Push the plunger down slowly as far as it will go until the syringe is empty, if the plunger is not fully depressed the needle shield will not extend to cover the needle when it is removed, hold the syringe in place for a full 5 seconds  ??? Check that the syringe is empty and keep pressing down on the plunger while you pull the needle out at the same angle as inserted; after the needle is removed completely from the skin, release the plunger allowing the needle shield to activate and cover  the used needle  ??? Dispose of the used syringe immediately in your sharps disposal container, do not attempt to recap the needle prior to disposing  ??? If you see any blood at the injection site, press a cotton ball or gauze on the site and maintain pressure until the bleeding stops, do not rub the injection site      Adherence/Missed dose instructions: If you miss a dose take as soon as you remember.  Resume the correct dosing schedule.        Goals of Therapy     ??? To treat asthma and or chronic idiopathic urticaria    Side Effects & Monitoring Parameters     Commonly reported side effects  ??? Headache  ??? Nausea, vomiting   ??? Injection site reaction  ??? Loss of strength and energy  ??? Common cold symptoms, sore throat, stuffy nose  ??? Ear pain  ??? Painful extremities     The following side effects should be reported to the provider:  ??? Signs of cerebrovascular disease (change in strength on one side is greater than the other, trouble speaking or thinking, change in balance or vision changes)  ??? Signs of DVT (swelling, warmth, numbness, change in color or pain in extremities)  ??? Signs of anaphylaxis (wheezing, chest tightness, swelling of face, lips, tongue or throat)    Monitoring Parameters:   ??? Anaphylactic/hypersensitivity reactions (observe patients for 2 hours after the first 3 injections and 30 minutes after subsequent injections or in accordance with individual institution policies and procedures);   ??? Baseline serum total IgE; FEV1, peak flow, and/or other pulmonary function tests  ??? Monitor for signs of infection    Contraindications, Warnings, & Precautions   [place] Boxed Warning]: Anaphylaxis, including delayed-onset anaphylaxis, has been reported following administration; anaphylaxis may present as bronchospasm, hypotension, syncope, urticaria, and/or angioedema of the throat or tongue. Anaphylaxis has occurred after the first dose and in some cases >1 year after initiation of regular treatment. Due to the risk, patients should be observed closely for an appropriate time period after administration and should receive treatment only under direct medical supervision. Healthcare providers should be prepared to administer appropriate therapy for managing potentially life-threatening anaphylaxis. Patients should be instructed on identifying signs/symptoms of anaphylaxis and to seek immediate care if they arise.      Contraindications  ??? Severe hypersensitivity reaction to omalizumab or any component of the formulation    Warnings & Precautions  ??? Cardiovascular effects: Cerebrovascular events, including transient ischemic attack and ischemic stroke, have been reported.  ??? Eosinophilia and vasculitis: In rare cases, patients may present with systemic eosinophilia, sometimes presenting with clinical features of vasculitis.    ??? Fever/arthralgia/rash: Reports of a constellation of symptoms including fever, arthritis or arthralgia, rash, and lymphadenopathy have been reported with post-marketing use.  ??? Malignant neoplasms: Have been reported rarely with use in short-term studies; impact of long-term use is not known.  ??? Parasitic infections: Use with caution and monitor patients at high risk for parasitic infections; risk of infection may be increased; appropriate duration of continued monitoring following therapy discontinuation has not been established.  ??? Corticosteroid therapy: Gradually taper systemic or inhaled corticosteroid therapy; do not discontinue corticosteroids abruptly following initiation of omalizumab therapy. The combined use of omalizumab and corticosteroids in patients with chronic idiopathic urticaria has not been evaluated.  ??? Latex: Prefilled syringe: The needle cap may contain natural rubber latex.  ??? Appropriate use: Therapy has not been shown to  alleviate acute asthma exacerbations; do not use to treat acute bronchospasm, status asthmaticus, or other allergic conditions. Do not use to treat forms of urticaria other than chronic idiopathic urticaria.  ??? Dosing/IgE levels: Dosing for asthma is based on body weight and pretreatment total IgE serum levels. IgE levels remain elevated up to 1 year following treatment; therefore, levels taken during treatment or for up to 1 year following treatment cannot and should not be used as a dosage guide. Dosing in chronic idiopathic urticaria is not dependent on serum IgE (free or total) level or body weight.  ??? Pregnancy Considerations: Omalizumab is a humanized monoclonal antibody (IgG1). Potential placental transfer of human IgG is dependent upon the IgG subclass and gestational age, generally increasing as pregnancy progresses.  ??? Breastfeeding Considerations: It is not known if omalizumab is present in breast milk; however, IgG is excreted in human milk. Based on information from the pregnancy exposure registry, an increased risk of adverse events was not observed in breastfed infants of mothers using omalizumab. According to the manufacturer, the decision to breastfeed during therapy should consider the risk of infant exposure, the benefits of breastfeeding to the infant, and benefits of treatment to the mother      Drug/Food Interactions     ??? Medication list reviewed in Epic. The patient was instructed to inform the care team before taking any new medications or supplements. No drug interactions identified.     Storage, Handling Precautions, & Disposal     ??? Store this medication in the refrigerator, 2??C to 8??C (36??F to 46??F). in the original carton.  Protect from direct sunlight and do not freeze. Must be used within 4 hours after removal from refrigerator.  ??? Do not shake.  ??? Dispose of used syringes in a sharps disposal container.      Current Medications (including OTC/herbals), Comorbidities and Allergies     Current Outpatient Medications   Medication Sig Dispense Refill   ??? albuterol HFA 90 mcg/actuation inhaler Inhale 2 puffs two (2) times a day as needed for wheezing. 8 g 5   ??? cyclobenzaprine (FLEXERIL) 5 MG tablet Take 1 tablet (5 mg total) by mouth Three (3) times a day. 60 tablet 0   ??? dextroamphetamine-amphetamine (ADDERALL) 10 mg tablet Take 1 tablet (10 mg total) by mouth two (2) times a day as needed (attention). 60 tablet 0   ??? [START ON 04/11/2020] dextroamphetamine-amphetamine (ADDERALL) 10 mg tablet Take 1 tablet (10 mg total) by mouth two (2) times a day as needed (attention). 60 tablet 0   ??? EPINEPHrine (EPIPEN) 0.3 mg/0.3 mL injection Inject 0.3 mL (0.3 mg total) into the muscle once for 1 dose. 1 each 0   ??? famotidine (PEPCID) 20 MG tablet Take 1 tablet (20 mg total) by mouth daily. 30 tablet 1   ??? fluticasone propion-salmeteroL (ADVAIR HFA) 230-21 mcg/actuation inhaler Inhale 2 puffs Two (2) times a day. 12 g 3   ??? gabapentin (NEURONTIN) 300 MG capsule Take 1 capsule at night 90 capsule 3   ??? hydrOXYzine (ATARAX) 25 MG tablet Take 1 tablet (25 mg total) by mouth daily as needed for anxiety. 90 tablet 1   ??? lisdexamfetamine (VYVANSE) 50 MG capsule Take 1 capsule (50 mg total) by mouth every morning. 30 capsule 0   ??? [START ON 04/12/2020] lisdexamfetamine (VYVANSE) 50 MG capsule Take 1 capsule (50 mg total) by mouth every morning. 30 capsule 0   ??? montelukast (SINGULAIR) 10 mg tablet Take 1 tablet (  10 mg total) by mouth nightly. 30 tablet 11   ??? omalizumab (XOLAIR) 150 mg/mL syringe Inject 1 mL (150 mg total) under the skin every twenty-eight (28) days. 1 mL 0   ??? omalizumab (XOLAIR) 75 mg/0.5 mL syringe Inject the contents of 1 syringe (75 mg total) under the skin every twenty-eight (28) days. 1 mL 0   ??? predniSONE (DELTASONE) 10 MG tablet Started 02/09/20. Taking 50 mg daily x2 days, 40 mg x2 days, 30 mg x2 days, 20 mg x2 days, 10 mg x2 days (Patient not taking: Reported on 03/28/2020)       No current facility-administered medications for this visit.       No Known Allergies    Patient Active Problem List   Diagnosis   ??? Attention deficit hyperactivity disorder (ADHD), combined type, mild, in partial remission   ??? Dysmenorrhea   ??? Chronic sinusitis   ??? Routine general medical examination at a health care facility   ??? Depression, major, recurrent, in remission (CMS-HCC)       Reviewed and up to date in Epic.    Appropriateness of Therapy     Is medication and dose appropriate based on diagnosis? Yes    Prescription has been clinically reviewed: Yes    Baseline Quality of Life Assessment      How many days over the past month did your severe persistent asthma  keep you from your normal activities? For example, brushing your teeth or getting up in the morning. Patient declined to answer    Financial Information     Medication Assistance provided: Prior Authorization    Anticipated copay of $0 reviewed with patient. Verified delivery address.    Delivery Information     Scheduled delivery date: 2/28    Expected start date: patient needs to make appt. Herbert Seta will reach out.     Medication will be delivered via [facility] to the temporary address in [place].  This shipment will not require a signature.      Explained the services we provide at [facility] and that each month we would call to set up refills.  Stressed importance of returning phone calls so that we could ensure they receive their medications in time each month.  Informed patient that we should be setting up refills 7-10 days prior to when they will run out of medication.  A pharmacist will reach out to perform a clinical assessment periodically.  Informed patient that a welcome packet, containing information about our pharmacy and other support services, a Notice of Privacy Practices, and a drug information handout will be sent.      Patient verbalized understanding of the above information as well as how to contact the pharmacy at [phone] option 4 with any questions/concerns.  The pharmacy is open Monday through Friday 8:30am-4:30pm.  A pharmacist is available 24/7 via pager to answer any clinical questions they may have.    Patient Specific Needs     - Does the patient have any physical, cognitive, or cultural barriers? No    - Patient prefers to have medications discussed with  Patient     - Is the patient or caregiver able to read and understand education materials at a high school level or above? Yes    - Patient's primary language is  English     - Is the patient high risk? No    - Does the patient require a Care  Management Plan? No     - Does the patient require physician intervention or other additional services (i.e. nutrition, smoking cessation, social work)? No      [facility] Specialty Pharmacist [facility] Specialty Pharmacist

## 2020-04-07 NOTE — Unmapped (Addendum)
[place] [facility] Consultation    Conducted via Telehealth secondary to [facility] cancellation for COVID-19     Date of Encounter: 03/24/2020  Patient Name: Jamie Webb  Patient Age: 30 y.o.  Patient Location at Time of Encounter: [place]  Provider Location: Remote / / Not in Clinic    REASON FOR CONSULTATION: Evaluate for hereditary cancer susceptibility     REFERRING PROVIDER:  Marlaine Hind, PA    GENETIC COUNSELOR: Fran Mcree, MS, CGC  ATTENDING GENETICIST: Ronalee Belts, MD, PhD     ____________________________________________________________________      ASSESSMENT:   Jamie Webb is a 30 y.o. female with a family history of breast cancer. Her mother died at age 68 of an accidental fatal medication interaction, prior to her passing she had an abnormal mammogram but no additional information is available. Her maternal grandmother was diagnosed in her 36s with breast cancer and has recurrent disease now in her 9s, she has one sister in her 72s who was diagnosed with breast cancer young and another sister died in her 69s of recurrent metastatic breast cancer to her brain, their father died of prostate cancer. This history is suggestive of a hereditary predisposition to cancer. No living affected family members are interested in pursuing testing, thus Jamie Webb is the most appropriate candidate for testing. Learning about the presence of an inherited cancer risk would guide immediate medical management and on-going recommendations for cancer screening. It would also provide important information for at-risk family members. We, therefore, recommend a panel focused on our genes related to breast cancer.     PLAN:   (1) Genetic testing has been ordered -- [facility] Breast Cancer Guidelines-Based Panel: next-generation sequencing and deletion/duplication testing of ATM, BRCA1, BRCA2, CDH1, CHEK2, NBN, NF1, PALB2, PTEN, STK11, TP53 genes; $1500 via [facility]; 3-week TAT     (2) The patient will visit a [place] phlebotomy lab in the future at their convenience. The address to the [facility] phlebotomy lab will be sent to the patient via MyChart.     (3) Results should be available approximately 2-3 weeks after the sample is submitted. They will be disclosed by telephone to Jamie Webb, as will any additional recommendations warranted by these results.      _____________________________________________________________________      HISTORY OF PRESENTING INDICATION:   Jamie Webb is a 30 y.o. female being evaluated by the [facility] at the request of Dr. Duaine Dredge for consultation regarding possible hereditary predisposition to cancer. Jamie Webb attended clinic via telehealth visit to discuss this concern and possible genetic testing.     Medical history relevant to hereditary cancer evaluation includes:  1. Jamie Webb has never been diagnosed with cancer.  2. She has had no prior colonoscopy, mammogram or hysterectomy.  3. No additional cancer history is reported.      Past Medical History:   Diagnosis Date   ??? Allergic rhinitis    ??? Anxiety    ??? Asthma    ??? Attention deficit disorder     Testing done at [facility] clinic in [place]   ??? Chronic sinusitis    ??? Depression    ??? Dysmenorrhea    ??? Headache    ??? Hx of chronic bronchitis        Past Surgical History:   Procedure Laterality Date   ??? CESAREAN SECTION         FAMILY HISTORY:   During the visit, a 4-generation pedigree was obtained.  Children: Unaffected daughter age 12  Siblings: Brother (76) unaffected currently expecting their first daughter, sister (86) unaffected with healthy 49 month old son    Mother and maternal relatives:   - Mother (d. 21) from an accidental fatal medication interaction post surgery, she had an abnormal mammogram right before she died. No additional evaluation was performed.   - The maternal grandmother has recurrent breast cancer in her 56s, initially diagnosed in her 66s.,  - One maternal aunt, currently in her 81s with breast cancer diagnosed young but no additional information is available  - One maternal aunt is deceased in her 33s of recurrent metastatic breast cancer to her brain  - The maternal grandfather died with prostate cancer but no additional information is known    Father and paternal relatives:   - Father is alive and well at age 79 he has one unaffected brother age 33 with no children paternal grandmother is in her 74s also alive and well and paternal grandfather died at 62 of heart failure    Jamie Webb's maternal family has European ancestry.  The paternal family has [place] and Native American (Cherokee) ancestry.  Ashkenazi Jewish ancestry is denied for both maternal and paternal families.      DISCUSSION:   We held a general discussion about genes, cancer, and hereditary cancer susceptibility. We described the characteristics of individuals and families with hereditary cancer susceptibility. We reviewed the personal and family history and discussed that we recommend genetic testing to evaluate the possibility for an inherited cancer risk condition. Most people who have testing do not have an inherited cancer risk.     We discussed the benefits, risks, and limitations of genetic testing for hereditary cancer risk. We reviewed the family history, including the appropriate family members to test. Lastly, we discussed the process of testing, insurance coverage and turn-around-time for results.  Once the sample is received by the lab, it will take about 3 weeks to complete. We reviewed the potential implications of a positive, negative and/ or Variant of Uncertain Significance (VUS) result.    Jamie Webb wished to pursue genetic testing and provided informed consent.  Jamie Webb had an opportunity to ask questions, which were answered to her satisfaction.    Based on Jamie Webb???s family history, we also recommend her affected family members have genetic counseling and testing. We discussed that it is always most informative to initiate genetic testing in a family member diagnosed with cancer in that it can sometimes help [place] better interpret results for unaffected family members. Ms. Putman will speak with this family member and let us know if we can be of any assistance in coordinating genetic counseling and/or testing.     Ms. Quirke has our contact information and is encouraged to communicate with [place] at any time with questions or concerns.  Thank you for allowing [place] to participate in the care of your patient. We also encouraged Ms. Klouda to remain in contact with Cancer Genetics annually so that we can update the family history and inform her of any changes in cancer genetics and testing that may be of benefit for this family.      This patient was discussed with the genetics attending, Ronalee Belts, MD, PhD who agrees with the above assessment and plan. The genetic counselor, Harjit Leider, MS, CGC, spent approximately 45 mins in consultation with the patient through one-on-one genetic counseling via telehealth.     _______________________________________  [facility]  [phone]              The patient was physically located in [place] or a state in which I am permitted to provide care. The patient and/or parent/guardian understood that s/he may incur co-pays and cost sharing, and agreed to the telemedicine visit. The visit was reasonable and appropriate under the circumstances given the patient's presentation at the time.    The patient and/or parent/guardian has been advised of the potential risks and limitations of this mode of treatment (including, but not limited to, the absence of in-person examination) and has agreed to be treated using telemedicine. The patient's/patient's family's questions regarding telemedicine have been answered.     If the visit was completed in an ambulatory setting, the patient and/or parent/guardian has also been advised to contact their provider???s office for worsening conditions, and seek emergency medical treatment and/or call 911 if the patient deems either necessary.

## 2020-04-08 NOTE — Unmapped (Signed)
Received call about IGE in regards to  for Xolair. Needs a cal back at [phone]. Routing to team

## 2020-04-11 MED ORDER — DEXTROAMPHETAMINE-AMPHETAMINE 10 MG TABLET
ORAL_TABLET | Freq: Two times a day (BID) | ORAL | 0 refills | 30 days | Status: CP | PRN
Start: 2020-04-11 — End: 2020-05-11

## 2020-04-11 MED FILL — XOLAIR 150 MG/ML SUBCUTANEOUS SYRINGE: SUBCUTANEOUS | 28 days supply | Qty: 2 | Fill #0

## 2020-04-12 MED ORDER — LISDEXAMFETAMINE 50 MG CAPSULE
ORAL_CAPSULE | Freq: Every morning | ORAL | 0 refills | 30 days | Status: CP
Start: 2020-04-12 — End: 2020-05-11

## 2020-04-14 NOTE — Unmapped (Signed)
PHONE CALL  PATIENT: [name]  GENETIC COUNSELOR: Deuntae Kocsis, MS, CGC    REASON FOR CALL: Normal Genetic Testing Result Disclosure    Called the patient, Ms. Leveda Kendrix to discuss her recent genetic test results. We had recommended genetic testing based on her family history of breast cancer.    RESULTS. Normal result. Testing did not identify any genetic changes associated with cancer predisposition. Ms. Kimmora Kalbfleisch's test was performed at [facility] and included sequencing and deletion/duplication analysis of the following genes: ATM, BARD1, BRCA1, BRCA2, CDH1, CHEK2, NF1, PALB2, PTEN, STK11, TP53.     We consider her normal result to be reassuring and do not recommend additional genetic testing at this time.     We do still strongly recommend her affected family members receive appropriate genetic counseling and testing this will aid in our understanding and interpretation of her negative results.    A copy of the test result, family history and note will be mailed to the patient upon request.     We encouraged the patient to please remain in contact with [place] to update [place] with relevant personal and family history information that could impact our assessment.     Achilles Dunk Bradly Sangiovanni, MS, CGC  _______________________________________  [facility]  [phone]  [email or web address]

## 2020-04-28 ENCOUNTER — Institutional Professional Consult (permissible substitution): Admit: 2020-04-28 | Discharge: 2020-04-29 | Payer: PRIVATE HEALTH INSURANCE

## 2020-04-28 NOTE — Unmapped (Signed)
Nursing Assessment completed.   Drug allergies assessed.     Med supplied: [facility]Patient Supplied     Drug Administered: Xolair 225mg    Route: SubQ  Frequency: 14 days     Right arm: 150 mg  Lot #: [id number] / Exp 12/2020    Left arm: 75 mg  Lot #: [id number] / Exp 12/2020    Assessment:    Pt arrived to clinic for first Xolair Injection. Pt was able to tolerate injection. Pt was taught how to use the Epi Pen should she have a reaction to the medication.  Pt c/o mild HA after injection as well as mild dizziness that resolved. Patient aware to contact office if side effects occur.     RTC: 2 weeks    Narda Bonds, RN     Pt can leave at 5:09

## 2020-05-04 NOTE — Unmapped (Signed)
This patient is currently Under Review for Complex Case Management services. For progress, care plan changes, updates or recent discharges please contact CM.

## 2020-05-13 ENCOUNTER — Institutional Professional Consult (permissible substitution): Admit: 2020-05-13 | Discharge: 2020-05-14 | Payer: PRIVATE HEALTH INSURANCE

## 2020-05-13 NOTE — Unmapped (Signed)
Nursing Assessment completed.   Drug allergies assessed.     Med supplied: [facility]-Patient Supplied     Drug Administered: Xolair 225mg    Route: SubQ  Frequency: 14 days     Right arm: 75 mg  Lot #: [id number] / Exp 12/2020    Left arm: 150 mg  Lot #: [id number] / Exp 12/2020    Assessment:  Tolerated procedure well, no side effects. Patient has epi pen.   Patient aware to contact office if side effects occur.     RTC: 2 weeks    Narda Bonds, RN

## 2020-05-15 NOTE — Unmapped (Signed)
ESTABLISHED PATIENT RHINOLOGY VISIT    SUBJECTIVE:    Reason for visit:  Ms. Pare is seen in follow-up.     HPI:   Triston Lisanti is a 30 y.o. female who presents today for evaluation of chronic sinonasal symptoms. The patient reports longstanding symptoms.     Interval Update 05/18/2020: The patient returns today for follow-up. She reports continued multiple sinus infections despite greater than 3 weeks of antibiotic therapy.  She had a CT scan today. The results of her CT scan were reviewed with the patient.     Interval Update 09/02/2019: The last time She was prescribed antibiotics (Augmentin) was 2 months ago, which did help with the sinus symptoms.    The patient's medical record has been thoroughly reviewed and pertinent details include: Per referring provider: NO IMAGING. Patient notes that she has been having sinus infections every month.  She mostly feels left-sided sinus pain with thick mucus.  She has tried Management consultant. No medical treament for sinus infections. She reports that her sense of smell is not great.     Chronic Sinusitis  - Referral placed to ENT.     The patient reports anterior nasal drainage , posterior nasal drainage and nasal congestion  and denies  facial pressure, headache, fever , throat clearing, cough, hoarseness , shortness of breath, feeling bad , sneezing ,  itching eyes  and disturbance of smell .    Quality & Severity of symptoms: Long standing allergy symptoms    Timing/Context/Circumstances: Long standing    Associated signs and symptoms: Congestion, nasal drainage    Previous therapies: Augmentin, prednisone taper    Exacerbating factors: none    Alleviating Factors: Flonase, Claritin    The patient also denies dysphagia, odynophagia, hemoptysis, weight loss, acute vision changes, paresthesias, nuchal rigidity, or any other constitutional symptoms.    Allergy testing: no On immunotherapy: no      Past Medical/Surgical History  She  has a past medical history of Allergic rhinitis, Anxiety, Asthma, Attention deficit disorder, Chronic sinusitis, Depression, Dysmenorrhea, Headache, and chronic bronchitis.  Her  has a past surgical history that includes Cesarean section.    Past Family/Social History  Her family history includes ADD / ADHD in her father and mother; Anxiety disorder in her mother; Cancer in her maternal grandmother and another family member; Depression in her mother; Diabetes in her father, paternal grandfather, and paternal uncle; Heart attack in her father; Hypertension in her father.  She  reports that she has been smoking cigarettes. She has been smoking about 0.25 packs per day. She has never used smokeless tobacco. She reports that she does not drink alcohol and does not use drugs.    Medications/Allergies/Immunizations  Her current medication(s) include:    Current Outpatient Medications on File Prior to Visit   Medication Sig   ??? dextroamphetamine-amphetamine (ADDERALL) 10 mg tablet Take 1 tablet (10 mg total) by mouth two (2) times a day as needed (attention).   ??? famotidine (PEPCID) 20 MG tablet Take 1 tablet (20 mg total) by mouth daily.   ??? fluticasone propion-salmeteroL (ADVAIR HFA) 230-21 mcg/actuation inhaler Inhale 2 puffs Two (2) times a day.   ??? fluticasone propionate (FLOVENT HFA) 220 mcg/actuation inhaler Inhale 3 puffs once as needed.   ??? hydrOXYzine (ATARAX) 25 MG tablet Take 1 tablet (25 mg total) by mouth daily as needed for anxiety.   ??? lisdexamfetamine (VYVANSE) 50 MG capsule Take 1 capsule (50 mg total) by mouth every morning.   ???  omalizumab (XOLAIR) 150 mg/mL syringe Inject the contents of 1 syringe (150 mg total) under the skin every fourteen (14) days. Inject along with 75 mg syringe for total dose of 225 mg every 14 days.   ??? omalizumab (XOLAIR) 75 mg/0.5 mL syringe Inject the contents of 1 syringe (75 mg total) under the skin every fourteen (14) days. Inject along with 150 mg syringe for total dose of 225 mg every 14 days.   ??? albuterol HFA 90 mcg/actuation inhaler Inhale 2 puffs two (2) times a day as needed for wheezing. (Patient not taking: Reported on 05/18/2020)   ??? EPINEPHrine (EPIPEN) 0.3 mg/0.3 mL injection Inject 0.3 mL (0.3 mg total) into the muscle once for 1 dose. (Patient not taking: Reported on 05/18/2020)   ??? gabapentin (NEURONTIN) 300 MG capsule Take 1 capsule at night (Patient not taking: Reported on 05/18/2020)     No current facility-administered medications on file prior to visit.     Allergies: Patient has no known allergies.,  Immunizations:   Immunization History   Administered Date(s) Administered   ??? COVID-19 VACC,MRNA,(PFIZER)(PF)(IM) 05/12/2019, 06/02/2019   ??? Influenza Vaccine Quad (IIV4 PF) 9mo+ injectable 04/08/2017, 02/10/2020   ??? PNEUMOCOCCAL POLYSACCHARIDE 23 09/28/2012   ??? Td (adult) unspecified formulation 02/12/1993, 09/03/2007   ??? TdaP 08/10/2012        Review of Systems:  As above and on the new patients intake form, otherwise the balance of 11 systems was negative.  Her RSDI score today was 78, previously 81, and is documented in the chart.      OBJECTIVE:    Physical Exam  Vital Signs:  Temp 36.7 ??C (98.1 ??F)  - Resp 20  - Ht 167.6 cm (5' 6)  - Wt 88.9 kg (196 lb)  - BMI 31.64 kg/m??   General:  Well-developed, well-nourished  Communication and Voice:  Clear pitch and clarity  Respiratory  Respiratory effort:  Equal inspiration and expiration without stridor      PROCEDURE NOTE    Procedure: Bilateral Sinonasal Endoscopy (CPT 16109)    Indication: Chronic sinusitis symptoms. On anterior rhinoscopy, visualization posteriorly is limited on anterior examination. For this reason, to adequately evaluate posteriorly for masses, polypoid disease and/or signs of infections, nasal endoscopy is indicated.    Consent: After discussion of the risks of the procedure (primarily pain and bleeding) and obtaining informed verbal consent, a time-out was performed confirming the patient???s name, birthdate, and procedure to be performed.    Surgeon: Aurelio Brash, Montez Hageman., MD, MPH (entire procedure performed by surgeon)    Anesthesia: None    Complications: None    Disposition: Discharged home in stable condition.     Findings:    Nasal Cavity: Mucosa appears mildly edematous with evidence of mucopurulence on the left. SER is clear  Evidence of previous surgery:  No.: Neither  Septum: Midline.  Inferior turbinates: Without hypertrophy  RIGHT:   Semilunar Hiatus: Without polyps, Without purulence.   Sphenoethmoid Recess:  Without polyps, Without purulence.   LEFT:   Semilunar Hiatus: Without polyps, POSITIVE FOR purulence.   Sphenoethmoid Recess:  Without polyps, Without purulence.     Procedure: The 2.58mm 30 degree and/or 70 degree telescopes were used to perform nasal endoscopy on each side. Findings mentioned above. The telescopes were then withdrawn and the patient tolerated the procedure well.    Oretha Ellis Nasal Endoscopy Score    Left        ?? Polyps:  Absent (0)   ??  Edema:   Mild (1)   ?? Discharge:  Thick, Purulent (2)    ?? Scarring:  Absent (0)   ?? Crusting:  None (0)      Total Left:  3     Right         ?? Polyps:  Absent (0)   ?? Edema:  Mild (1)   ?? Discharge: Thick, Purulent (2)    ?? Scarring:  Absent (0)   ?? Crusting:  None (0)      Total Right:   3    Diagnostic Studies:     CT Sinus 05/18/2020: There is evidence of opacification of each [facility] with mucous membrane thickening in each maxillary sinus left greater than right.  There are scattered F. Boyd inflammation bilaterally both anterior and posterior.  There is no significant inflammation within the frontal sinus.        MEDICAL DECISION MAKING:  Allergic Rhinitis  Posterior Nasal Drainage  Sinonasal Complaints  She  has a past medical history of Allergic rhinitis, Anxiety, Asthma, Attention deficit disorder, Chronic sinusitis, Depression, Dysmenorrhea, Headache, and chronic bronchitis.      PLAN:  1. Ms. Manske has inflammation and mucopurulence in the left nasal cavity noted on nasal endoscopy exam today in clinic. We have extensively discussed treatment options. The patient will start on a nasal hygiene regimen which includes once a day isotoninc nasal irrigation and nasal steroid sprays (2 sprays BID each nostril). We have discussed the proper positioning for optimal use of the nasal steroid. Should medical therapy fail we plan to discuss other treatment options.    2. Patient has evidence of infection and inflammation in the left nasal cavity on my scope exam today. I will prescribe her a course of doxycycline today. She will also start a Prednisone taper.    The risks of antibiotics were discussed at length with the patient, including stomach upset, sun sensitivity including severe sun burn, tendonitis with rare rupture, and diarrhea.  The patient was advised to wear sun protection when outside, to take daily probiotics, and to minimize activities if joint pain occurs. Advised to stop antibiotics if any of these side effects occur and to notify physician.     The potential side effects of high-dose steroid use were discussed at length. These risks include but are not limited to: increased appetite, insomnia, fluid retention, mood swings, weight gain, change in blood pressure, high blood glucose, possible adrenal suppression, osteoporosis, avascular necrosis of the hip, menstrual irregularities (if applicable), and cataracts. The patient understands these risks and is willing to proceed with oral steroid therapy.    Discussed immunotherapy for allergic inflammation should these medical therapies fail. I encouraged her to follow up with pulmonology for continued treatment of her asthma     3. We have discussed at length the treatment options which include but are not limited to the following: observation, further medical therapy, and/or surgical intervention. Based on the fact that observation and medical therapy has not resulted in resolution of symptoms and the patient has persistent sinusitis on both nasal endoscopy and radiographic imaging, the patient has decided to proceed with elective major surgery.    The procedure itself was discussed at length. The risks, benefits, and alternatives were explained in detail which include but is not limited to: anesthesia, pain, loss of taste/smell, bleeding, infection, need for nasal packing, double vision, vision loss, CSF leak requiring other procedures to repair, numbness of teeth/and or face, need for revision surgery,  brain/skull base injury, a poor result, as well as unexpected/unanticipated risks. The patient understands these risks and is willing to proceed with surgery. All questions were answered.      Note - This record has been created using [employer]. Chart creation errors have been sought, but may not always have been located. Such creation errors do not reflect on the standard of medical care.

## 2020-05-16 NOTE — Unmapped (Signed)
[facility] Administered Medication Refill Coordination Note      NAME:Jamie Webb DOB: May 31, 1990      Medication: Xolair  Day Supply: 14 days      SHIPPING      Next delivery from [facility] [phone]) to the [facility] for Jamie Webb is scheduled for 05/23/20.    Clinic contact: Lurene Shadow    Patient's next nurse visit for administration: 4/15.    We will follow up with clinic monthly for standard refill processing and delivery.      Laurene Footman Oniel Meleski  Specialty Pharmacy Pharmacist

## 2020-05-18 ENCOUNTER — Ambulatory Visit: Admit: 2020-05-18 | Discharge: 2020-05-19 | Payer: PRIVATE HEALTH INSURANCE

## 2020-05-18 DIAGNOSIS — J324 Chronic pansinusitis: Principal | ICD-10-CM

## 2020-05-18 MED ORDER — PREDNISONE 10 MG TABLET
ORAL_TABLET | 0 refills | 0 days | Status: CP
Start: 2020-05-18 — End: ?

## 2020-05-18 MED ORDER — DOXYCYCLINE HYCLATE 100 MG TABLET
ORAL_TABLET | Freq: Two times a day (BID) | ORAL | 0 refills | 21 days | Status: CP
Start: 2020-05-18 — End: 2020-06-08

## 2020-05-18 NOTE — Unmapped (Addendum)
[place] [facility]  Otolaryngology- Head and Neck Surgery Patient Instruction Sheet    BUFFERED ISOTONIC SALINE NASAL IRRIGATION     The Benefits:    1. When you irrigate, the isotonic saline (salt water) acts as a solvent and washes the mucus crusts and other debris from your nose.    2. This decongests and improves the airflow into your nose.  The sinus passages begin to open.    3. Studies have also shown that a salt water and an alkaline (baking soda) irrigation solution improves nasal membrane cell function (mucociliary flow of mucus debris).    The Recipe:    1. Choose a 1-quart glass jar that is thoroughly cleansed.    2. Fill with sterile or distilled water, or you can boil water from the tap.    3. Add 1 to 2 heaping teaspoons of pickling/canning/sea salt (NOT table salt as it contains a large number of additives).  This salt is available at the grocery store in the food canning section.    4. Add 1 teaspoon of Arm [employer] Hammer Baking Soda (pure bicarbonate).    5. Mix ingredients together and store at room temperature.  Discard after one week.  If you find this solution too strong, you may decrease the amount of salt added to 1 to 1 ?? teaspoons.  With children it is often best to start with a milder solution and advance slowly.  Irrigate with 240 ml (8 oz) twice daily.    The Instructions:    You should plan to irrigate your nose with buffered isotonic saline 2 times per day.  Many people prefer to warm the solution slightly in the microwave - but be sure that the solution is NOT HOT.  Stand over the sink (some do this in the shower) and squirt the solution into each side of your nose, keeping your mouth open. This allows you to spit the saltwater out of your mouth.  It will not harm you if you swallow a little.    If you have been told to use a nasal steroid such as Flonase, Nasonex, or Nasacort, you should always use isortonic saline solution first, then use your nasal steroid product.  The nasal steroid is much more effective when sprayed onto clean nasal membranes and the steroid medicine will reach deeper into the nose.    Most people experience a little burning sensation the first few times they use a isotonic saline solution, but this usually goes away within a few days.    [place] [facility] [place] [facility]  Otolaryngology- Head and Neck Surgery Patient Instruction Sheet    NASAL STEROID USE INSTRUCTIONS    Step 1. Prepare the nose. Blow the nose before administering the drug.    Step 2. Prime and activate the delivery device as recommended by the manufacturer.    Step 3. Position the head by tilting the head forward.    Step 4. Insert the tip of the applicator gently, avoiding contact with the septum.    Step 5. Aim the applicator tip about 45?? from the floor of the nose and direct it at the outer corner of the eye on the same side to avoid traumatizing or spraying the septum.    Step 6. Close the other nostril gently with a finger.     Step 7. Sniff or inhale gently while delivering the drug.  WHAT WHAT SIDE EFFECTS CAN CORTICOSTEROIDS CAUSE?  Like all medications, corticosteroids carry a risk of side effects.  Some side effects can cause serious    health problems.  When you know what side effects are possible, you can take steps to control them.                   Side effects of oral corticosteroids:    Because oral corticosteroids affect your entire body instead of a particular area, this form is the       most likely to cause significant side effects.  Side effects depend on the dose of medication you receive.  Within days or weeks of starting oral therapy, you may have an increased risk of:    ??? Elevated pressure in the eyes (glaucoma)    ??? Fluid retentions, causing swelling in your lower legs    ??? Increased blood pressure    ??? Mood swings    ??? Weight gain, with fat deposits in your abdomen, face and the back of your neck    When taking oral corticosteroids longer term, you may experience:    ??? Cataracts    ??? High blood sugar, which can trigger or worsen diabetes    ??? Increased risk of infections    ??? Loss of calcium from bones, which can lead to osteoporosis and fractures    ??? Menstrual irregularities    ??? Suppressed adrenal gland hormone production    ??? Thin skin, easy bruising and slower wound healing    ??? Sudden loss of bone in hip, requiring hip replacement    The risks of antibiotics includes stomach upset, sun sensitivity including severe sun burn, tendonitis with rare rupture, and diarrhea.  You are advised to wear sun protection when outside, to take daily probiotics, and to minimize activities if joint pain occurs. You are advised to stop antibiotics if any of these side effects occur and to notify physician.

## 2020-05-18 NOTE — Unmapped (Signed)
Addended by: Edmund Hilda T on: 05/18/2020 04:09 PM     Modules accepted: Orders

## 2020-05-20 DIAGNOSIS — Z01818 Encounter for other preprocedural examination: Principal | ICD-10-CM

## 2020-05-20 NOTE — Unmapped (Signed)
TC to patient to schedule Anesthesia Evaluation.    6/29 @ 1400    Patient wishes to retrieve appt info from MyChart.

## 2020-05-23 MED FILL — XOLAIR 150 MG/ML SUBCUTANEOUS SYRINGE: SUBCUTANEOUS | 14 days supply | Qty: 1 | Fill #1

## 2020-05-23 MED FILL — XOLAIR 75 MG/0.5 ML SUBCUTANEOUS SYRINGE: SUBCUTANEOUS | 14 days supply | Qty: 0.5 | Fill #1

## 2020-05-27 ENCOUNTER — Institutional Professional Consult (permissible substitution): Admit: 2020-05-27 | Discharge: 2020-05-28 | Payer: PRIVATE HEALTH INSURANCE

## 2020-05-27 NOTE — Unmapped (Signed)
Nursing Assessment completed.   Drug allergies assessed.     Med supplied: [facility]-Patient Supplied     Drug Administered: Xolair 225mg    Route: SubQ  Frequency: 14 days     Right arm: 150 mg  Lot #: [id number] / Exp 02/2021    Left arm: 75 mg  Lot #: [id number] / Exp 02/2021    Assessment:  Tolerated procedure well, no side effects. Patient did not have epi pen. Pt okay with waiting afterwards for monitoring.   Patient aware to contact office if side effects occur.     RTC: at home    Narda Bonds, RN

## 2020-06-01 NOTE — Unmapped (Signed)
[facility] Administered Medication Refill Coordination Note      NAME:Jamie Webb DOB: 02-04-1991      Medication: Xolair  Day Supply: 14 days      SHIPPING      Next delivery from [facility] [phone]) to the [facility] for [name] is scheduled for 4/25.    Clinic contact: Lurene Shadow    Patient's next nurse visit for administration: 5/2 .    We will follow up with clinic monthly for standard refill processing and delivery.      Laurene Footman [name]  Specialty Pharmacy Pharmacist

## 2020-06-02 DIAGNOSIS — J45909 Unspecified asthma, uncomplicated: Principal | ICD-10-CM

## 2020-06-02 MED ORDER — FLUTICASONE PROPIONATE 230 MCG-SALMETEROL 21 MCG/ACTUATION HFA INHALER
Freq: Two times a day (BID) | RESPIRATORY_TRACT | 3 refills | 0 days | Status: CP
Start: 2020-06-02 — End: 2021-06-02

## 2020-06-02 NOTE — Unmapped (Signed)
Pharmacy Refill Request    Advair hfa    Last OV: 03/28/20    Last written Rx: 02/10/20    Last dispensed:       Upcoming appointment:   Routing to nurse for refill

## 2020-06-06 MED ORDER — DEXTROAMPHETAMINE-AMPHETAMINE 10 MG TABLET
ORAL_TABLET | Freq: Two times a day (BID) | ORAL | 0 refills | 30 days | Status: CP | PRN
Start: 2020-06-06 — End: 2020-07-06

## 2020-06-06 MED ORDER — LISDEXAMFETAMINE 50 MG CAPSULE
ORAL_CAPSULE | Freq: Every morning | ORAL | 0 refills | 30 days | Status: CP
Start: 2020-06-06 — End: 2020-07-05

## 2020-06-06 MED FILL — XOLAIR 75 MG/0.5 ML SUBCUTANEOUS SYRINGE: SUBCUTANEOUS | 14 days supply | Qty: 0.5 | Fill #2

## 2020-06-06 MED FILL — XOLAIR 150 MG/ML SUBCUTANEOUS SYRINGE: SUBCUTANEOUS | 14 days supply | Qty: 1 | Fill #2

## 2020-06-07 ENCOUNTER — Telehealth
Admit: 2020-06-07 | Discharge: 2020-06-08 | Payer: PRIVATE HEALTH INSURANCE | Attending: Psychiatric/Mental Health | Primary: Psychiatric/Mental Health

## 2020-06-07 DIAGNOSIS — F3340 Major depressive disorder, recurrent, in remission, unspecified: Principal | ICD-10-CM

## 2020-06-07 DIAGNOSIS — F902 Attention-deficit hyperactivity disorder, combined type: Principal | ICD-10-CM

## 2020-06-07 MED ORDER — LISDEXAMFETAMINE 60 MG CAPSULE
ORAL_CAPSULE | Freq: Every morning | ORAL | 0 refills | 30 days | Status: CP
Start: 2020-06-07 — End: 2020-07-06

## 2020-06-07 MED ORDER — HYDROXYZINE HCL 25 MG TABLET
ORAL_TABLET | Freq: Every day | ORAL | 1 refills | 90.00000 days | Status: CP | PRN
Start: 2020-06-07 — End: 2020-12-04

## 2020-06-07 MED ORDER — DEXTROAMPHETAMINE-AMPHETAMINE 10 MG TABLET
ORAL_TABLET | Freq: Two times a day (BID) | ORAL | 0 refills | 30 days | Status: CP | PRN
Start: 2020-06-07 — End: 2020-07-07

## 2020-06-07 NOTE — Unmapped (Signed)
[facility]  Psychiatry   Established Patient E&M Service - Outpatient       Assessment:    Jamie Webb presents for follow-up evaluation.   Last seen 03/08/20    Epic notes reviewed-  Multiples of visits regarding sinuses. Will need surgery. We discuss certainly not what anyone wants but will be good to correct this issue instead of suffering every day.    Identifying Information:  Jamie Webb is a 30 y.o. female with a history of   Patient Active Problem List   Diagnosis   ??? Attention deficit hyperactivity disorder (ADHD), combined type, mild, in partial remission   ??? Dysmenorrhea   ??? Chronic sinusitis   ??? Routine general medical examination at a health care facility   ??? Depression, major, recurrent, in remission (CMS-HCC)       Risk Assessment:  An assessment of suicide and violence risk factors was performed as part of this evaluation and is not significantly changed from the last visit.   While future psychiatric events cannot be accurately predicted, the patient does not currently require acute inpatient psychiatric care and does not currently meet [facility] involuntary commitment criteria.      Plan:    Problem 1:??ADHD  Status of problem:??improved or improving  Interventions:??  Vyvanse from 50mg  to 60mg -  ????Coaching about not chasing energy here, we are looking at function. ??Will continue to monitor.  Keeping regular Adderall 10mg 's BID for now.  Advised to take 5mg  in the morning as a 'jump start' and 15mg  in the afternoon  ??  Problem 2:??Mood- depression/anxiety  Status of problem:??chronic and stable, mostly resolved as we worked on treating the ADHD her functionality improved and less chaos in her life and mood improved. ??Now off antidpressants. ??She says they were only good for side effects.   Interventions:??  ??  Continue with hydroxyzine- prn usage a couple of times a week.   ??  Now off gabapentin.  ??  Suggested therapy.  Daughter now going.             Psychotherapy provided:  No billable psychotherapy service provided but brief supportive therapy was utilized.    Patient has been given this writer's contact information as well as the [facility] Psychiatry urgent line number. The patient has been instructed to call 911 for emergencies.          Subjective:    Chief complaint:  Follow-up psychiatric evaluation for ADHD    Interval History:     Dealing with sinus issues.     Today with her daughters infant son, [name] has been watching him as her job.  Getting a little bit of money and family 'works together' but its completely taken all her 'me time'.  She used to drop her daughter at school and come home and nap and now she is on at all times.  When he naps she feels she has to do things like yardwork.  Her own daughter comes home from school and is full on energy, probably adhd and [name] has to adjust and find more energy when she wants to wind down.            Objective:  Metabolic Check  Wt Readings from Last 6 Encounters:   05/18/20 88.9 kg (196 lb)   03/28/20 88.9 kg (196 lb)   02/10/20 95.3 kg (210 lb)   09/02/19 96.6 kg (213 lb)   08/12/19 98.1 kg (216 lb 4.8 oz)   02/26/18  94.5 kg (208 lb 5.4 oz)       Mental Status Exam:  Appearance:    Appears stated age, Well nourished, Well developed and Clean/Neat   Motor:   No abnormal movements   Speech/Language:    Normal rate, volume, tone, fluency   Mood:   busy   Affect:   Calm and Cooperative   Thought process and Associations:   Logical, linear, clear, coherent, goal directed   Abnormal/psychotic thought content:     Denies SI, HI, self harm, delusions, obsessions, paranoid ideation, or ideas of reference   Perceptual disturbances:     Denies auditory and visual hallucinations, behavior not concerning for response to internal stimuli            I personally spent 32 minutes face-to-face and non-face-to-face in the care of this patient, which includes all pre, intra, and post visit time on the date of service.    I spent 25 minutes on the real-time audio and video with the patient on the date of service. I spent an additional 7 minutes on pre- and post-visit activities.     The patient was physically located in [place] or a state in which I am permitted to provide care. The patient and/or parent/guardian understood that s/he may incur co-pays and cost sharing, and agreed to the telemedicine visit. The visit was reasonable and appropriate under the circumstances given the patient's presentation at the time.    The patient and/or parent/guardian has been advised of the potential risks and limitations of this mode of treatment (including, but not limited to, the absence of in-person examination) and has agreed to be treated using telemedicine. The patient's/patient's family's questions regarding telemedicine have been answered.     If the visit was completed in an ambulatory setting, the patient and/or parent/guardian has also been advised to contact their provider???s office for worsening conditions, and seek emergency medical treatment and/or call 911 if the patient deems either necessary.    Patient educated on every visit on the role of the Psychiatric Nurse Practitioner.  It was explained provider is not a Visual merchandiser.  They are aware this provider does psychiatric medications only and that they are responsible for seeing their primary care physician for medical needs and that there might be medical conditions that cause psychiatric symptoms (like thyroid issues) that will require collaborative work with this provider, their PCP, and the patient.        [name], PMHNP  06/07/2020

## 2020-06-13 ENCOUNTER — Institutional Professional Consult (permissible substitution): Admit: 2020-06-13 | Discharge: 2020-06-14 | Payer: PRIVATE HEALTH INSURANCE

## 2020-06-13 NOTE — Unmapped (Signed)
Nursing Assessment completed.   Drug allergies assessed.     Med supplied: Clinic Supply (Buy/Bill)     Drug Administered: Xolair 300mg    Route: SubQ  Frequency: 14 days     Assessment:    Pt arrived to clinic for teaching of Xolair administration. Pt was provided teaching for injection and was able to provide understanding using the teachback method. Pt was able to administer both injections in her abdomen without complication. She tolerated procedure well, no side effects. Patient has epi pen.   Patient aware to contact office if side effects occur. Next injection will be set up by [facility] for home delivery.     RTC: home    Narda Bonds, RN

## 2020-06-14 MED ORDER — EMPTY CONTAINER
2 refills | 0 days
Start: 2020-06-14 — End: ?

## 2020-06-14 NOTE — Unmapped (Signed)
[facility] Clinical Assessment & Refill Coordination Note    Jamie Webb, DOB: Feb 10, 1991  Phone: [phone] (home)     All above HIPAA information was verified with patient.     Was a Nurse, learning disability used for this call? No    Specialty Medication(s):   CF/Pulmonary: -Xolair     Current Outpatient Medications   Medication Sig Dispense Refill   ??? albuterol HFA 90 mcg/actuation inhaler Inhale 2 puffs two (2) times a day as needed for wheezing. (Patient not taking: Reported on 05/18/2020) 8 g 5   ??? dextroamphetamine-amphetamine (ADDERALL) 10 mg tablet Take 1 tablet (10 mg total) by mouth two (2) times a day as needed (attention). 60 tablet 0   ??? [START ON 07/05/2020] dextroamphetamine-amphetamine (ADDERALL) 10 mg tablet Take 1 tablet (10 mg total) by mouth two (2) times a day as needed (attention). 60 tablet 0   ??? [START ON 08/03/2020] dextroamphetamine-amphetamine (ADDERALL) 10 mg tablet Take 1 tablet (10 mg total) by mouth two (2) times a day as needed (attention). 60 tablet 0   ??? EPINEPHrine (EPIPEN) 0.3 mg/0.3 mL injection Inject 0.3 mL (0.3 mg total) into the muscle once for 1 dose. (Patient not taking: Reported on 05/18/2020) 1 each 0   ??? famotidine (PEPCID) 20 MG tablet Take 1 tablet (20 mg total) by mouth daily. 30 tablet 1   ??? fluticasone propion-salmeteroL (ADVAIR HFA) 230-21 mcg/actuation inhaler Inhale 2 puffs Two (2) times a day. 12 g 3   ??? fluticasone propionate (FLOVENT HFA) 220 mcg/actuation inhaler Inhale 3 puffs once as needed.     ??? hydrOXYzine (ATARAX) 25 MG tablet Take 1 tablet (25 mg total) by mouth daily as needed for anxiety. 90 tablet 1   ??? lisdexamfetamine (VYVANSE) 50 MG capsule Take 1 capsule (50 mg total) by mouth every morning. 30 capsule 0   ??? lisdexamfetamine (VYVANSE) 60 MG capsule Take 1 capsule (60 mg total) by mouth every morning. 30 capsule 0   ??? [START ON 07/07/2020] lisdexamfetamine (VYVANSE) 60 MG capsule Take 1 capsule (60 mg total) by mouth every morning. 30 capsule 0   ??? [START ON 08/06/2020] lisdexamfetamine (VYVANSE) 60 MG capsule Take 1 capsule (60 mg total) by mouth every morning. 30 capsule 0   ??? omalizumab (XOLAIR) 150 mg/mL syringe Inject the contents of 1 syringe (150 mg total) under the skin every fourteen (14) days. Inject along with 75 mg syringe for total dose of 225 mg every 14 days. 2 mL 12   ??? omalizumab (XOLAIR) 75 mg/0.5 mL syringe Inject the contents of 1 syringe (75 mg total) under the skin every fourteen (14) days. Inject along with 150 mg syringe for total dose of 225 mg every 14 days. 1 mL 12   ??? predniSONE (DELTASONE) 10 MG tablet 30 mg po qday x 3 days, then 20 mg po qday x 3 days, then 10 mg qday x 3days then d/c. 18 tablet 0     No current facility-administered medications for this visit.        Changes to medications: Jamie Webb reports no changes at this time.    No Known Allergies    Changes to allergies: No    SPECIALTY MEDICATION ADHERENCE              Specialty medication(s) dose(s) confirmed: Regimen is correct and unchanged.     Are there any concerns with adherence? No    Adherence counseling provided? Not needed    CLINICAL  MANAGEMENT AND INTERVENTION      Clinical Benefit Assessment:    Do you feel the medicine is effective or helping your condition? Yes    Clinical Benefit counseling provided? Not needed    Adverse Effects Assessment:    Are you experiencing any side effects? No    Are you experiencing difficulty administering your medicine? No    Quality of Life Assessment:    How many days over the past month did your asthma  keep you from your normal activities? For example, brushing your teeth or getting up in the morning. 0    Have you discussed this with your provider? Not needed    Acute Infection Status:    Acute infections noted within Epic:  No active infections  Patient reported infection: None    Therapy Appropriateness:    Is therapy appropriate? Yes, therapy is appropriate and should be continued    DISEASE/MEDICATION-SPECIFIC INFORMATION      For patients on injectable medications: Patient currently has 0 doses left.  Next injection is scheduled for 5/16.    PATIENT SPECIFIC NEEDS     - Does the patient have any physical, cognitive, or cultural barriers? No    - Is the patient high risk? No    - Does the patient require a Care Management Plan? No     - Does the patient require physician intervention or other additional services (i.e. nutrition, smoking cessation, social work)? No      SHIPPING     Specialty Medication(s) to be Shipped:   CF/Pulmonary: -Xolair 150mg , and 75mg     Other medication(s) to be shipped: sharps container     Changes to insurance: No    Delivery Scheduled: Yes, Expected medication delivery date: 5/10.     Medication will be delivered via Same Day Courier to the confirmed prescription address in [facility].    The patient will receive a drug information handout for each medication shipped and additional FDA Medication Guides as required.  Verified that patient has previously received a Conservation officer, historic buildings and a Surveyor, mining.    All of the patient's questions and concerns have been addressed.    Julianne Rice   [facility] Specialty Pharmacist

## 2020-06-21 MED FILL — EMPTY CONTAINER: 90 days supply | Qty: 1 | Fill #0

## 2020-06-21 MED FILL — XOLAIR 150 MG/ML SUBCUTANEOUS SYRINGE: SUBCUTANEOUS | 28 days supply | Qty: 2 | Fill #3

## 2020-06-21 MED FILL — XOLAIR 75 MG/0.5 ML SUBCUTANEOUS SYRINGE: SUBCUTANEOUS | 28 days supply | Qty: 1 | Fill #3

## 2020-07-05 MED ORDER — DEXTROAMPHETAMINE-AMPHETAMINE 10 MG TABLET
ORAL_TABLET | Freq: Two times a day (BID) | ORAL | 0 refills | 30 days | Status: CP | PRN
Start: 2020-07-05 — End: 2020-08-04

## 2020-07-07 MED ORDER — LISDEXAMFETAMINE 60 MG CAPSULE
ORAL_CAPSULE | Freq: Every morning | ORAL | 0 refills | 30 days | Status: CP
Start: 2020-07-07 — End: 2020-08-05

## 2020-07-08 DIAGNOSIS — J4551 Severe persistent asthma with (acute) exacerbation: Principal | ICD-10-CM

## 2020-07-08 MED ORDER — EPINEPHRINE 0.3 MG/0.3 ML INJECTION, AUTO-INJECTOR
Freq: Once | INTRAMUSCULAR | 0 refills | 1 days | Status: CP
Start: 2020-07-08 — End: 2020-07-08

## 2020-07-08 NOTE — Unmapped (Signed)
[facility] Refill Coordination Note    Specialty Medication(s) to be Shipped:   CF/Pulmonary: -Xolair 75mg /0.44ml & Xolair 150mg /89ml  Other medication(s) to be shipped: No additional medications requested for fill at this time     [name], DOB: Oct 28, 1990  Phone: [phone] (home)     All above HIPAA information was verified with patient.     Was a Nurse, learning disability used for this call? No    Completed refill call assessment today to schedule patient's medication shipment from the [facility] [phone]).  All relevant notes have been reviewed.     Specialty medication(s) and dose(s) confirmed: Regimen is correct and unchanged.   Changes to medications: [name] reports no changes at this time.  Changes to insurance: No  New side effects reported not previously addressed with a pharmacist or physician: None reported  Questions for the pharmacist: No    Confirmed patient received a Conservation officer, historic buildings and a Surveyor, mining with first shipment. The patient will receive a drug information handout for each medication shipped and additional FDA Medication Guides as required.       DISEASE/MEDICATION-SPECIFIC INFORMATION        For CF patients: CF [facility] Active? No-not enrolled    SPECIALTY MEDICATION ADHERENCE     Medication Adherence    Patient reported X missed doses in the last month: 0  Specialty Medication: Xolair 75mg /0.53ml  Patient is on additional specialty medications: Yes  Additional Specialty Medications: Xolair 150mg /46ml  Patient Reported Additional Medication X Missed Doses in the Last Month: 0  Patient is on more than two specialty medications: No  Informant: patient  Reliability of informant: reliable  Reasons for non-adherence: no problems identified        Were doses missed due to medication being on hold? No    Xolair 75mg /0.48ml : 3 days of medicine on hand (Pt has 1 injection which is to be taken 07/11/20)  Xolair 150mg /5ml : 3 days of medicine on hand (Pt has 1 injection which is to be taken 07/11/20)    REFERRAL TO PHARMACIST     Referral to the pharmacist: Not needed    [facility]     Shipping address confirmed in Epic.     Delivery Scheduled: Yes, Expected medication delivery date: 07/18/2020.     Medication will be delivered via Same Day Courier to the prescription address in Epic [facility].    Correy Weidner P Allena Katz   [facility] Specialty Technician

## 2020-07-18 MED FILL — XOLAIR 75 MG/0.5 ML SUBCUTANEOUS SYRINGE: SUBCUTANEOUS | 28 days supply | Qty: 1 | Fill #4

## 2020-07-18 MED FILL — XOLAIR 150 MG/ML SUBCUTANEOUS SYRINGE: SUBCUTANEOUS | 28 days supply | Qty: 2 | Fill #4

## 2020-07-25 ENCOUNTER — Ambulatory Visit: Admit: 2020-07-25 | Discharge: 2020-07-26 | Payer: PRIVATE HEALTH INSURANCE

## 2020-07-25 DIAGNOSIS — M25461 Effusion, right knee: Principal | ICD-10-CM

## 2020-07-25 LAB — C-REACTIVE PROTEIN: C-REACTIVE PROTEIN: <4 mg/L (ref <=10.0)

## 2020-07-25 LAB — SEDIMENTATION RATE: ERYTHROCYTE SEDIMENTATION RATE: 17 mm/h (ref 0–20)

## 2020-07-25 LAB — RHEUMATOID FACTOR, QUANT: RHEUMATOID FACTOR: <3.5 [IU]/mL (ref <14.0)

## 2020-07-25 MED ORDER — DICLOFENAC SODIUM 75 MG TABLET,DELAYED RELEASE
ORAL_TABLET | Freq: Two times a day (BID) | ORAL | 2 refills | 30 days | Status: CP
Start: 2020-07-25 — End: 2020-08-24

## 2020-07-25 NOTE — Unmapped (Signed)
Encounter Provider: Mitzi Davenport, PA  Date of Service: 07/25/2020  Primary Care Provider: Marlaine Hind, PA    Jamie Webb is a 30 y.o. female   ASSESSMENT       ICD-10-CM   1. Effusion of right knee  M25.461   Present since 07/20/2020    PLAN:   I have recommended aspiration  Inflammatory throughout the labs were ordered  If symptoms persist could consider advanced imaging  Prescription for diclofenac was provided  Procedure Note: Right knee aspiration and or/injection    Consent   After discussing the various treatment options for the condition, It was agreed that an aspiration with possible corticosteroid injection would be the next step in treatment.  The risks of aspiration and injection including infection, bleeding, allergic reaction, increased pain, incomplete relief of symptoms, alterations of blood glucose levels requiring careful monitoring and treatment as indicated, tendon, ligament or articular cartilage rupture or degeneration, nerve injury, skin depigmentation, and/or fatty atrophy were discussed.   Procedure   After the risks and benefits of the procedure were explained,verbal consent was given, and a procedural time-out was performed.  The right knee was marked and prepped with Chlorhexadine solution and anesthetized with ethyl chloride and 2 ml of subcutaneus 1% plain lidocaine without epinepherine. Using and 18 gauge needle 20 ml of straw-colored followed by bloody fluid was removed.  Aspirate was sent for cell count, gram stain, crystals, culture and sensitivity. Sterile band-aide was applied. The patient tolerated the procedure well and was discharged without complication.    -Advised OTC analgesic PRN pain  -Discussed treatment options and patient was amenable to the above plan and was instructed to call and be seen or got to the emergency department if there is any increasing pain or concerns.      Scheduling Notes:  I will send the patient a MyChart message with lab results but she should follow-up with sports med APP     Requested Prescriptions      No prescriptions requested or ordered in this encounter      Orders Placed This Encounter   Procedures   ??? XR Knee 4 Or More Views Right       History:  Chief complaint: Right knee pain and swelling   The encounter diagnosis was Effusion of right knee.  HPI:  Jamie Webb is a 30 y.o. with a PMHx of anxiety/depression, asthma presenting to [facility] complaining of right knee swelling that has been present since 07/20/2020 and pain that began on 07/25/2020 without injury.  She has a family history of inflammatory arthropathy and gout, does have personal history of intermittent knee pain when she was younger and does feel that her knee gave way on her few days ago.  She notes significant swelling her knee especially in the evening time.  Denies fever, chills, night sweats.  Has not had unprotected sex with a new sexual partner.  Pain Assessment: 0-10  0-10 Pain Scale: 4    Review of Systems  .   Marland Kitchen   Medical History Past Medical History:   Diagnosis Date   ??? Allergic rhinitis    ??? Anxiety    ??? Asthma    ??? Attention deficit disorder     Testing done at [facility] clinic in [place]   ??? Chronic sinusitis    ??? Depression    ??? Dysmenorrhea    ??? Headache    ??? Hx of chronic bronchitis  Surgical History Past Surgical History:   Procedure Laterality Date   ??? CESAREAN SECTION        Allergies Patient has no known allergies.   Medications She has a current medication list which includes the following prescription(s): albuterol, dextroamphetamine-amphetamine, [START ON 08/03/2020] dextroamphetamine-amphetamine, empty container, epinephrine, famotidine, fluticasone propion-salmeterol, fluticasone propionate, hydroxyzine, lisdexamfetamine, [START ON 08/06/2020] lisdexamfetamine, omalizumab, omalizumab, and prednisone.   Family History {Her family history includes ADD / ADHD in her father and mother; Anxiety disorder in her mother; Cancer in her maternal grandmother and another family member; Depression in her mother; Diabetes in her father, paternal grandfather, and paternal uncle; Heart attack in her father; Hypertension in her father; No Known Problems in her brother, maternal aunt, maternal grandfather, maternal uncle, paternal aunt, paternal grandmother, and sister.   Social History She reports that she has been smoking cigarettes. She has been smoking about 0.25 packs per day. She has never used smokeless tobacco. She reports that she does not drink alcohol and does not use drugs.Home address:[address]  [place] [address]  Occupation:         Occupational History   ??? Not on file     Social History     Social History Narrative    Exercise: Walking/running            Exam:  The encounter diagnosis was Effusion of right knee.   Musculoskeletal   Right Knee       Palpation: Suprapatella tenderness. No other femoral condyle, tibial plateau, joint line, patella, patella tendon, ITB tenderness       Range of motion:  FROM extensionFROM  flexion       Stability/Special test:  Negative McMurray's, stable Lachman's, stable Posterior Drawer, stable Valgus Stress, stable Varus stress, Negative Apprehension, Negative  Nobles, Negative Crepittion felt with grind of the knee      Strength: 5/5 extension5/5 flexion of the knee       Inspection: negative soft tissue swelling, negative joint effusion,  negative erythema,  negative deformity, negative ecchymosis, skin intact       Pulses: Dorsalis pedal pulses easily palpable        Neurologic: SILT tibial/sural/saphenous/deep/superficial peroneal distributions     BMI Estimated body mass index is 31.64 kg/m?? as calculated from the following:    Height as of 05/18/20: 167.6 cm (5' 6).    Weight as of 05/18/20: 88.9 kg (196 lb).      Test Results  The encounter diagnosis was Effusion of right knee.  Imaging  Orders Placed This Encounter   Procedures   ??? XR Knee 4 Or More Views Right     Four views of the right Knee, weight bearing independently reviewed and interpreted by myself show Small effusion. No other obvious fractures, lucencies, dislocations, or abnormalities.        MEDICAL DECISION MAKING (level of service defined by 2/3 elements)     Number/Complexity of Problems Addressed 1 acute, uncomplicated illness or injury (99203/99213)   Amount/Complexity of Data to be Reviewed/Analyzed Independent interpretation of a test performed by another physician/other qualified health care professional (99204/99214)   Risk of Complications/Morbidity/Mortality of Management Prescription Medication (99204/99214)     *Patient note was created using Dragon Dictation sotware. Errors in syntax or grammar may not have been identified and edited on initial review.

## 2020-07-26 LAB — ANTI-DNA ANTIBODY, DOUBLE-STRANDED: DSDNA ANTIBODY: NEGATIVE

## 2020-07-27 LAB — CYCLIC CITRUL PEPTIDE ANTIBODY, IGG
CCP ANTIBODIES: 1 {ELISA'U} (ref <7.0)
CCP IGG ANTIBODIES: NEGATIVE

## 2020-07-27 LAB — ANA: ANTINUCLEAR ANTIBODIES (ANA): NEGATIVE

## 2020-08-03 MED ORDER — SODIUM FLUORIDE 1.1 % DENTAL PASTE
0 days
Start: 2020-08-03 — End: ?

## 2020-08-03 MED ORDER — DEXTROAMPHETAMINE-AMPHETAMINE 10 MG TABLET
ORAL_TABLET | Freq: Two times a day (BID) | ORAL | 0 refills | 30 days | Status: CP | PRN
Start: 2020-08-03 — End: 2020-09-02

## 2020-08-05 NOTE — Unmapped (Signed)
[facility] Refill Coordination Note    Specialty Medication(s) to be Shipped:   CF/Pulmonary: -Xolair 75mg /0.31ml & Xolair 150mg /ml  Other medication(s) to be shipped: No additional medications requested for fill at this time     Jamie Webb, DOB: [birth year]  Phone: [phone] (home)     All above HIPAA information was verified with patient.     Was a Nurse, learning disability used for this call? No    Completed refill call assessment today to schedule patient's medication shipment from the [facility] [phone]).  All relevant notes have been reviewed.     Specialty medication(s) and dose(s) confirmed: Regimen is correct and unchanged.   Changes to medications: [name] reports no changes at this time.  Changes to insurance: No  New side effects reported not previously addressed with a pharmacist or physician: None reported  Questions for the pharmacist: No    Confirmed patient received a Conservation officer, historic buildings and a Surveyor, mining with first shipment. The patient will receive a drug information handout for each medication shipped and additional FDA Medication Guides as required.       DISEASE/MEDICATION-SPECIFIC INFORMATION        For CF patients: CF [facility] Active? No-not enrolled    SPECIALTY MEDICATION ADHERENCE     Medication Adherence    Patient reported X missed doses in the last month: 0  Specialty Medication: Xolair 75mg /0.19ml  Patient is on additional specialty medications: Yes  Additional Specialty Medications: Xolair 150mg /39ml  Patient Reported Additional Medication X Missed Doses in the Last Month: 0  Patient is on more than two specialty medications: No  Informant: patient  Reliability of informant: reliable  Reasons for non-adherence: no problems identified  Confirmed plan for next specialty medication refill: delivery by pharmacy  Refills needed for supportive medications: not needed        Were doses missed due to medication being on hold? No    Xolair 75mg /0.25ml: 3 days of medicine on hand   Xolair 150mg /11ml: 3 days of medicine on hand     REFERRAL TO PHARMACIST     Referral to the pharmacist: Not needed    [facility]     Shipping address confirmed in Epic.     Delivery Scheduled: Yes, Expected medication delivery date: 08/18/2020.     Medication will be delivered via Same Day Courier to the prescription address in Epic WAM.    Jamie Webb   [facility] Specialty Technician

## 2020-08-06 MED ORDER — LISDEXAMFETAMINE 60 MG CAPSULE
ORAL_CAPSULE | Freq: Every morning | ORAL | 0 refills | 30 days | Status: CP
Start: 2020-08-06 — End: 2020-09-04

## 2020-08-09 ENCOUNTER — Ambulatory Visit: Admit: 2020-08-09 | Discharge: 2020-08-10 | Payer: PRIVATE HEALTH INSURANCE | Attending: Family | Primary: Family

## 2020-08-09 NOTE — Unmapped (Signed)
Thank you for choosing [facility]!  We appreciate the opportunity to participate in your care. Please let us know if we can be of assistance your orthopaedic issues in the future.      If you have questions or concerns, please do not hesitate to contact us by [facility] or by calling [phone] to speak with one of our clinical support sports team members.      [place] MyChart Website: [email or web address]        Appointment Scheduling: [phone]     Walk-in hours:          [facility]:  Monday - Friday 8 am - 5 pm  [address]  [address] Floor, [address]  [place], [place]  [address]        __________________________________________________________________________     Patient Specific Information: I have ordered an imaging study for you today.    Please contact the MRI scheduling department to schedule your imaging study appointment: [phone] option#1.    Follow-up: After scheduling your imaging study please call the Orthopaedic clinic scheduling line at [phone] to schedule a phone visit to discuss results and recommendations for follow-up.    __________________________________________________________________________      __________________________________________________________________________

## 2020-08-09 NOTE — Unmapped (Signed)
[facility]  Date: 08/09/2020     Primary Care Physician: Marlaine Hind, PA          ASSESSMENT:    ICD-10-CM   1. Effusion of right knee  M25.461   2. Acute pain of right knee  M25.561       PLAN:  Based on patient's description of symptoms, history of joint effusion with continued pain and dysfunction, x-ray results, and physical exam findings suspect meniscal tear vs. chondromalacia patella and recommend MRI at this point for further workup. Patient is agreeable to the treatment plan and we will plan to call her with MRI results.       We will proceed with the following treatment plan:  1. Medications: OTC NSAIDS  2. DME/Cast: none    none    3. PT/OT: none  4. Injections:   none    Scheduling Notes:  No follow-ups on file.    - X-rays to be ordered at next visit: None     Requested Prescriptions      No prescriptions requested or ordered in this encounter        Orders Placed This Encounter   Procedures   ??? MRI Lower Extremity Joint Right Wo Contrast       SUBJECTIVE:  Chief Complaint:  Right knee pain  History of Present Illness:   Jamie Webb is a 30 y.o. female who presents for recheck of the right knee. Patient was seen in [facility] on 07/25/2020 for severe atraumatic right knee pain with effusion. She noted swelling that has been present since 07/20/2020 and pain that began on 07/25/2020 without injury.  She has a family history of inflammatory arthropathy and gout, does have personal history of intermittent knee pain when she was younger and does feel that her knee gave way on her few days ago.  Her knee was aspirated and about 30 mL of fluid was drawn off the knee.     Since the aspiration, patient notes decreased swelling, but is still having pain in the knee. Patient locates the most pain right on the patella and under the kneecap. She states she still has some puffiness, but it is mostly at night time and at the end of the day. She has been using ice on it and taking NSAIDs for pain relief. She was prescribed diclofenac at her [facility] visit, but due to insurance she was not able to pick it up. She denies locking of the knee, but notes a lot of instability when squatting down  She notes some stiffness in the knee when sitting and at the end of the day. 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 She denies numbness and tingling in the area, fever/chills, or other systemic symptoms.         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       Pertinent positives and negatives are documented in the HPI. All other systems reviewed are negative. Patient was instructed to follow-up with the appropriate provider as necessary for all pertinent positives not related to today's encounter.  Medical History:  Past Medical History:   Diagnosis Date   ??? Allergic rhinitis    ??? Anxiety    ??? Asthma    ??? Attention deficit disorder     Testing done at [facility] clinic in [place]   ??? Chronic sinusitis    ??? Depression    ??? Dysmenorrhea    ??? Financial difficulties    ??? Headache    ??? Hx of chronic bronchitis        Surgical History:  Past Surgical History:   Procedure Laterality Date   ??? CESAREAN SECTION         Medications:  ??? albuterol HFA 90 mcg/actuation inhaler Inhale 2 puffs two (2) times a day as needed for wheezing.   ??? azelastine (ASTELIN) 137 mcg (0.1 %) nasal spray 1 spray into each nostril Two (2) times a day. Use in each nostril as directed   ??? dextroamphetamine-amphetamine (ADDERALL) 10 mg tablet Take 1 tablet (10 mg total) by mouth two (2) times a day as needed (attention).   ??? [START ON 10/31/2020] dextroamphetamine-amphetamine (ADDERALL) 10 mg tablet Take 1 tablet (10 mg total) by mouth two (2) times a day as needed (attention).   ??? dextroamphetamine-amphetamine (ADDERALL) 10 mg tablet Take 1 tablet (10 mg total) by mouth two (2) times a day as needed (attention).   ??? empty container Misc USE AS DIRECTED   ??? EPINEPHrine (EPIPEN) 0.3 mg/0.3 mL injection Inject 0.3 mL (0.3 mg total) into the muscle once for 1 dose.   ??? famotidine (PEPCID) 20 MG tablet Take 1 tablet (20 mg total) by mouth daily.   ??? fluoride, sodium, 1.1 % Pste    ??? fluticasone propion-salmeteroL (ADVAIR HFA) 230-21 mcg/actuation inhaler Inhale 2 puffs Two (2) times a day.   ??? fluticasone propionate (FLONASE) 50 mcg/actuation nasal spray 2 sprays into each nostril daily.   ??? fluticasone propionate (FLOVENT HFA) 220 mcg/actuation inhaler Inhale 3 puffs once as needed.   ??? hydrOXYzine (ATARAX) 25 MG tablet Take 1 tablet (25 mg total) by mouth daily as needed for anxiety.   ??? lisdexamfetamine (VYVANSE) 60 MG cap capsule Take 1 capsule (60 mg total) by mouth every morning.   ??? [START ON 11/04/2020] lisdexamfetamine (VYVANSE) 60 MG cap capsule Take 1 capsule (60 mg total) by mouth every morning.   ??? omalizumab (XOLAIR) 150 mg/mL syringe Inject the contents of 1 syringe (150 mg total) under the skin every fourteen (14) days. Inject along with 75 mg syringe for total dose of 225 mg every 14 days.   ??? omalizumab (XOLAIR) 75 mg/0.5 mL syringe Inject the contents of 1 syringe (75 mg total) under the skin every fourteen (14) days. Inject along with 150 mg syringe for total dose of 225 mg every 14 days.       Allergies:  Patient has no known allergies.    Social History:  Social History     Socioeconomic History   ??? Marital status: Media planner   Tobacco Use   ??? Smoking status: Current Some Day Smoker     Packs/day: 1.00     Types: Cigarettes   ??? Smokeless tobacco: Never Used   ??? Tobacco comment: smokes some infrequently   Vaping Use   ??? Vaping Use: Never used   Substance and Sexual Activity   ??? Alcohol use: No     Alcohol/week: 0.0 standard drinks     Comment: occasional   ??? Drug use: No   Social History  Narrative    Exercise: Walking/running     Social Determinants of Health     Financial Resource Strain: High Risk   ??? Difficulty of Paying Living Expenses: Very hard   Food Insecurity: Food Insecurity Present   ??? Worried About Programme researcher, broadcasting/film/video in the Last Year: Sometimes true   ??? Ran Out of Food in the Last Year: Sometimes true   Transportation Needs: Unmet Transportation Needs   ??? Lack of Transportation (Medical): Yes   ??? Lack of Transportation (Non-Medical): No   Physical Activity: Sufficiently Active   ??? Days of Exercise per Week: 5 days   ??? Minutes of Exercise per Session: 60 min   Stress: Stress Concern Present   ??? Feeling of Stress : Very much   Social Connections: Socially Integrated   ??? Frequency of Communication with Friends and Family: More than three times a week   ??? Frequency of Social Gatherings with Friends and Family: More than three times a week   ??? Attends Religious Services: 1 to 4 times per year   ??? Active Member of Clubs or Organizations: No   ??? Attends Banker Meetings: 1 to 4 times per year   ??? Marital Status: Living with partner       Family History:  Family History   Problem Relation Age of Onset   ??? Anxiety disorder Mother    ??? Depression Mother    ??? ADD / ADHD Mother    ??? Hypertension Father    ??? Heart attack Father    ??? ADD / ADHD Father    ??? Diabetes Father    ??? No Known Problems Sister    ??? No Known Problems Brother    ??? No Known Problems Maternal Aunt    ??? No Known Problems Maternal Uncle    ??? No Known Problems Paternal Aunt    ??? Diabetes Paternal Uncle    ??? Cancer Maternal Grandmother         breast   ??? No Known Problems Maternal Grandfather    ??? No Known Problems Paternal Grandmother    ??? Diabetes Paternal Grandfather    ??? Cancer Other         maternal great auntx2-breast   ??? Anesthesia problems Neg Hx    ??? Broken bones Neg Hx    ??? Clotting disorder Neg Hx ??? Collagen disease Neg Hx    ??? Dislocations Neg Hx    ??? Fibromyalgia Neg Hx    ??? Gout Neg Hx    ??? Hemophilia Neg Hx    ??? Osteoporosis Neg Hx    ??? Rheumatologic disease Neg Hx    ??? Scoliosis Neg Hx    ??? Severe sprains Neg Hx    ??? Sickle cell anemia Neg Hx    ??? Spinal Compression Fracture Neg Hx          OBJECTIVE:  DETAILED PHYSICAL EXAM   General Appearance ?? well-nourished, in no acute distress.  Estimated body mass index is 28.82 kg/m?? as calculated from the following:    Height as of 09/16/20: 170.2 cm (5' 7).    Weight as of 09/16/20: 83.5 kg (184 lb).   Mood and Affect ?? alert, cooperative and pleasant.   Gait  ?? smooth and steady   Cardiovascular ?? well-perfused distally and no swelling.   Sensation ?? sensation to light touch distally normal        Right Knee: No evidence of deformity,  swelling, or ecchymosis visualized. TTP along the lateral joint line, and along the inferior aspect of the patella. Full ROM with flexion and extension. Pain with McMurray's but no mechanical symptoms. Negative Anterior and Posterior drawer test.   Test Results  Imaging:  X-rays of the right knee was obtained today and independently interpreted by myself. These reveal: No fracture. Normal joint alignment. Normal joint spaces. Moderate-sized right knee effusion.  ??    *This note was created using Scientist, clinical (histocompatibility and immunogenetics). Errors may persist despite proofreading.       Ivor Reining, FNP  Willadean Carol PA-S2

## 2020-08-10 ENCOUNTER — Ambulatory Visit: Admit: 2020-08-10 | Discharge: 2020-08-11 | Payer: PRIVATE HEALTH INSURANCE

## 2020-08-10 DIAGNOSIS — J324 Chronic pansinusitis: Principal | ICD-10-CM

## 2020-08-10 DIAGNOSIS — Z01818 Encounter for other preprocedural examination: Principal | ICD-10-CM

## 2020-08-10 NOTE — Unmapped (Signed)
Per Anesthesia's guidelines:    Please take the following medications the morning of your procedure with a sip of water:  Any inhalers, as needed/prescribed.

## 2020-08-10 NOTE — Unmapped (Signed)
Addendum  created 08/10/20 1635 by Royann Shivers, MD    Clinical Note Signed

## 2020-08-18 MED FILL — XOLAIR 75 MG/0.5 ML SUBCUTANEOUS SYRINGE: SUBCUTANEOUS | 28 days supply | Qty: 1 | Fill #5

## 2020-08-18 MED FILL — XOLAIR 150 MG/ML SUBCUTANEOUS SYRINGE: SUBCUTANEOUS | 28 days supply | Qty: 2 | Fill #5

## 2020-08-23 ENCOUNTER — Ambulatory Visit: Admit: 2020-08-23 | Discharge: 2020-08-24 | Payer: PRIVATE HEALTH INSURANCE

## 2020-08-29 DIAGNOSIS — J4551 Severe persistent asthma with (acute) exacerbation: Principal | ICD-10-CM

## 2020-09-02 ENCOUNTER — Institutional Professional Consult (permissible substitution): Admit: 2020-09-02 | Discharge: 2020-09-03 | Payer: PRIVATE HEALTH INSURANCE | Attending: Family | Primary: Family

## 2020-09-02 MED ORDER — DEXTROAMPHETAMINE-AMPHETAMINE 10 MG TABLET
ORAL_TABLET | Freq: Two times a day (BID) | ORAL | 0 refills | 30.00000 days | Status: CP | PRN
Start: 2020-09-02 — End: 2020-10-02

## 2020-09-02 NOTE — Unmapped (Signed)
Thank you for choosing [facility]!  We appreciate the opportunity to participate in your care. Please let us know if we can be of assistance your orthopaedic issues in the future.      If you have questions or concerns, please do not hesitate to contact us by [facility] or by calling [phone] to speak with one of our clinical support sports team members.      [facility] MyChart Website: [email or web address]        Appointment Scheduling: [phone]     Walk-in hours:          [facility] [address] [facility]:  Monday - Friday 8 am - 5 pm  [address]  [address], [address]  [place], [place]  [address]        __________________________________________________________________________     Patient Specific Information: Activity as tolerated.  Ice or heat and over-the-counter analgesia as needed for pain.  Work on exercises as provided.     __________________________________________________________________________

## 2020-09-02 NOTE — Unmapped (Signed)
Assessment: R knee pain and effusion,degenerative lateral meniscus changes+/- lateral gastroc strain    Plan: I recommended working on home therapy exercises for gastrocnemius strain and allowing time to heal.  Would consider intra-articular steroid injection if knee continues to be a source of pain and follow-up in 4 weeks.  No x-rays at that visit    Reason for visit: R knee MRI results, phone visit    HPI: 30 yo woman with right knee pain and swelling since 07/20/2020 that began without obvious injury.  Noted knee swelling and had an aspiration in the [facility] on 07/25/2020 with Arlester Marker.  Lab results showed no abnormality consistent with inflammatory arthropathy or infection.  Upon seeing me on 08/09/2020 we ordered an MRI of the right knee and schedule a phone visit to discuss results and recommended treatment.  She continues to have pain daily with some swelling.  Because she is busy with her children, she does not have a chance to relax and rest the knee.  No interval injury.    ROS: As per HPI      Imaging: Right knee MRI:1.Mild edema within the lateral head gastrocnemius may reflect acute low-grade strain without tearing.  2.Minimal degeneration of the posterior horn lateral meniscus without a discrete tear.  3.No medial meniscal tear.  4.Small knee effusion.  5.No ligamentous tear.      *This note was created using Scientist, clinical (histocompatibility and immunogenetics). Errors may persist despite proofreading.     Jamie Webb A. Izola Price, FNP        The patient reports they are currently: at home. I spent 7 minutes on the phone with the patient on the date of service. I spent an additional 3 minutes on pre- and post-visit activities on the date of service.     The patient was physically located in [place] or a state in which I am permitted to provide care. The patient and/or parent/guardian understood that s/he may incur co-pays and cost sharing, and agreed to the telemedicine visit. The visit was reasonable and appropriate under the circumstances given the patient's presentation at the time.    The patient and/or parent/guardian has been advised of the potential risks and limitations of this mode of treatment (including, but not limited to, the absence of in-person examination) and has agreed to be treated using telemedicine. The patient's/patient's family's questions regarding telemedicine have been answered.     If the visit was completed in an ambulatory setting, the patient and/or parent/guardian has also been advised to contact their provider???s office for worsening conditions, and seek emergency medical treatment and/or call 911 if the patient deems either necessary.

## 2020-09-05 ENCOUNTER — Telehealth
Admit: 2020-09-05 | Discharge: 2020-09-06 | Payer: PRIVATE HEALTH INSURANCE | Attending: Psychiatric/Mental Health | Primary: Psychiatric/Mental Health

## 2020-09-05 DIAGNOSIS — F3340 Major depressive disorder, recurrent, in remission, unspecified: Principal | ICD-10-CM

## 2020-09-05 DIAGNOSIS — F902 Attention-deficit hyperactivity disorder, combined type: Principal | ICD-10-CM

## 2020-09-05 MED ORDER — HYDROXYZINE HCL 25 MG TABLET
ORAL_TABLET | Freq: Every day | ORAL | 1 refills | 90 days | Status: CP | PRN
Start: 2020-09-05 — End: 2021-03-04

## 2020-09-05 MED ORDER — DEXTROAMPHETAMINE-AMPHETAMINE 10 MG TABLET
ORAL_TABLET | Freq: Two times a day (BID) | ORAL | 0 refills | 30 days | Status: CP | PRN
Start: 2020-09-05 — End: 2020-10-05

## 2020-09-05 MED ORDER — LISDEXAMFETAMINE 60 MG CAPSULE
ORAL_CAPSULE | Freq: Every morning | ORAL | 0 refills | 30 days | Status: CP
Start: 2020-09-05 — End: 2020-10-04

## 2020-09-05 NOTE — Unmapped (Signed)
[facility]  Psychiatry   Established Patient E&M Service - Outpatient       Assessment:    Jamie Webb presents for follow-up evaluation.   Last seen 06/07/20    Epic notes reviewed-  Tough time recently physically with right knee pain- going to ortho and with a sinus procedure postponed due to surgeon unavailable due to covid.  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  Interestingly and sadly she was looking forward to the procedure because would have a break from childcare.    Identifying Information:  Jamie Webb is a 30 y.o. female with a history of   Patient Active Problem List   Diagnosis   ??? Attention deficit hyperactivity disorder (ADHD), combined type, mild, in partial remission   ??? Dysmenorrhea   ??? Chronic sinusitis   ??? Routine general medical examination at a health care facility   ??? Depression, major, recurrent, in remission (CMS-HCC)       Risk Assessment:  An assessment of suicide and violence risk factors was performed as part of this evaluation and is not significantly changed from the last visit.   While future psychiatric events cannot be accurately predicted, the patient does not currently require acute inpatient psychiatric care and does not currently meet [facility] involuntary commitment criteria.      Plan:    Problem 1:??ADHD  Status of problem:??improved or improving  Interventions:??  Vyvanse from 50mg  to 60mg -????????Coaching about not chasing energy here, we are looking at function. ??Will continue to monitor.  Keeping regular Adderall 10mg 's??BID??for now.  Advised to take 5mg  in the morning as a 'jump start' and 15mg  in the afternoon  ??  Problem 2:??Mood- depression/anxiety  Status of problem:??chronic and stable, mostly resolved as we worked on treating the ADHD her functionality improved and less chaos in her life and mood improved. ??Now off antidpressants. ??She says they were only good for side effects.   Interventions:??  ??  Continue with hydroxyzine- prn usage a couple of times a week.   ??  Now off gabapentin.  ??  Suggested therapy.  Daughter now going.             Psychotherapy provided:  No billable psychotherapy service provided but brief supportive therapy was utilized.    Patient has been given this writer's contact information as well as the [facility] urgent line number. The patient has been instructed to call 911 for emergencies.          Subjective:    Chief complaint:  Follow-up psychiatric evaluation for adhd and depression/anxiety    Interval History:   See above  Still watching daughters son who is now 1 and walking so requires more attention.  With her daughter home now for the summer (daughter is probably adhd, very intrusive)  Money is 'tight' which is why still watching her daughters son making it work            Objective:  Metabolic Check  Wt Readings from Last 6 Encounters:   08/10/20 83.6 kg (184 lb 3.2 oz)   05/18/20 88.9 kg (196 lb)   03/28/20 88.9 kg (196 lb)   02/10/20 95.3 kg (210 lb)   09/02/19 96.6 kg (213 lb)   08/12/19 98.1 kg (216 lb 4.8 oz)       Mental Status Exam:  Appearance:    Appears stated age, Well nourished, Well developed and  Clean/Neat   Motor:   No abnormal movements   Speech/Language:    Normal rate, volume, tone, fluency   Mood:   tired of watching kids, looking forward to her daughter going to school   Affect:   Calm and Cooperative   Thought process and Associations:   Logical, linear, clear, coherent, goal directed   Abnormal/psychotic thought content:     Denies SI, HI, self harm, delusions, obsessions, paranoid ideation, or ideas of reference   Perceptual disturbances:     Denies auditory and visual hallucinations, behavior not concerning for response to internal stimuli            I personally spent 31 minutes face-to-face and non-face-to-face in the care of this patient, which includes all pre, intra, and post visit time on the date of service.    I spent 21 minutes on the real-time audio and video with the patient on the date of service. I spent an additional 10 minutes on pre- and post-visit activities.     The patient was physically located in [place] or a state in which I am permitted to provide care. The patient and/or parent/guardian understood that s/he may incur co-pays and cost sharing, and agreed to the telemedicine visit. The visit was reasonable and appropriate under the circumstances given the patient's presentation at the time.    The patient and/or parent/guardian has been advised of the potential risks and limitations of this mode of treatment (including, but not limited to, the absence of in-person examination) and has agreed to be treated using telemedicine. The patient's/patient's family's questions regarding telemedicine have been answered.     If the visit was completed in an ambulatory setting, the patient and/or parent/guardian has also been advised to contact their provider???s office for worsening conditions, and seek emergency medical treatment and/or call 911 if the patient deems either necessary.    Patient educated on every visit on the role of the Psychiatric Nurse Practitioner.  It was explained provider is not a Visual merchandiser.  They are aware this provider does psychiatric medications only and that they are responsible for seeing their primary care physician for medical needs and that there might be medical conditions that cause psychiatric symptoms (like thyroid issues) that will require collaborative work with this provider, their PCP, and the patient.        [name], PMHNP  09/05/2020

## 2020-09-09 NOTE — Unmapped (Signed)
[facility] Refill Coordination Note    Specialty Medication(s) to be Shipped:   CF/Pulmonary: -Xolair 150mg /ml Syr & Xolair 75mg /0.83ml Syr  Other medication(s) to be shipped: No additional medications requested for fill at this time     Jamie Webb, DOB: 25-Oct-1990  Phone: [phone] (home)     All above HIPAA information was verified with patient.     Was a Nurse, learning disability used for this call? No    Completed refill call assessment today to schedule patient's medication shipment from the [facility] [phone]).  All relevant notes have been reviewed.     Specialty medication(s) and dose(s) confirmed: Regimen is correct and unchanged.   Changes to medications: [name] reports no changes at this time.  Changes to insurance: No  New side effects reported not previously addressed with a pharmacist or physician: None reported  Questions for the pharmacist: No    Confirmed patient received a Conservation officer, historic buildings and a Surveyor, mining with first shipment. The patient will receive a drug information handout for each medication shipped and additional FDA Medication Guides as required.       DISEASE/MEDICATION-SPECIFIC INFORMATION        For CF patients: CF [facility] Active? No-not enrolled    SPECIALTY MEDICATION ADHERENCE     Medication Adherence    Patient reported X missed doses in the last month: 0  Specialty Medication: Xolair 75mg /0.42ml Syr  Patient is on additional specialty medications: Yes  Additional Specialty Medications: Xolair 150mg /ml Syr  Patient Reported Additional Medication X Missed Doses in the Last Month: 0  Patient is on more than two specialty medications: No  Informant: patient  Reliability of informant: reliable  Reasons for non-adherence: no problems identified  Confirmed plan for next specialty medication refill: delivery by pharmacy  Refills needed for supportive medications: not needed        Were doses missed due to medication being on hold? No    Xolair 150mg /ml Syr: 0 days of medicine on hand (Next Injection: 09/19/2020)  Xolair 75mg /0.91ml Syr: 0 days of medicine on hand (Next Injection: 09/19/2020)    REFERRAL TO PHARMACIST     Referral to the pharmacist: Not needed    [facility]     Shipping address confirmed in Epic.     Delivery Scheduled: Yes, Expected medication delivery date: 09/13/2020.     Medication will be delivered via Same Day Courier to the prescription address in Epic WAM.    Jamie Webb   [facility] Specialty Technician

## 2020-09-13 MED FILL — XOLAIR 150 MG/ML SUBCUTANEOUS SYRINGE: SUBCUTANEOUS | 28 days supply | Qty: 2 | Fill #6

## 2020-09-13 MED FILL — XOLAIR 75 MG/0.5 ML SUBCUTANEOUS SYRINGE: SUBCUTANEOUS | 28 days supply | Qty: 1 | Fill #6

## 2020-09-15 NOTE — Unmapped (Signed)
PATIENT INFO:    Jamie Webb   [address]  [place] [address]   MRN: [id number]   DOB:  05/08/1990      PRIMARY CARE PROVIDER:    Marlaine Hind, PA   [address] [place]. [place] [address]      DATE OF SERVICE: 09/15/20      It was a pleasure seeing ??Jamie Webb at the Pediatric Allergy and Immunology clinic at [facility], for initial consultation at Dr. Veva Holes request.  I reviewed and summarized old records for this history and obtained the history from patient.     ASSESSMENT  & PLAN   ???In summary, Jamie Webb is a ??30 y.o.?  female with the following diagnoses:   ?  Allergic rhinitis with sensitivities to mouse, cat, dog, grass   Chronic Sinusitis  Allergic asthma  Pt w/ significant nasal congestion in s/o AR and chronic sinusitis, triggering her asthma. Elevated IgE and environmental sensitivities to cat, dog, mouse, grass on blood work. Since starting Xolair, she has had significant improvement. Discussed that she still needs to be taking Flonase 2 sprays in each nostril with nasal irrigation consistently since she is still having symptoms (esp before shots are due). Also recommended Astelin 1-2 sprays in each nostril for additional control.  -please take Flonase 2 sprays in each nostril daily. Proper technique reviewed.  -please do nasal irrigations daily  -START Astelin 1-2 sprays in each nostril daily  -continue Xolair  -we discussed SCIT, gave her information. She will consider. She lives in [place] and may find a local allergist to prescribe shots.  ?  Jamie Webb will follow-up here in our clinic in 6 months       HPI   ??As you know, Jamie Webb is a 30 y.o. female who presents today for evaluation of the following:    She has a hx of chronic sinusitis, with planned nasal surgery 11/2020. She use to take Flonase 2 sprays daily, Claritin, nasal irrigation daily. Follows with ENT. She also has asthma, was on Advair 2 puffs BID, saw pulmonology 03/2020 and in s/o uncontrolled asthma (requiring frequent pred/antibiotics) with elevated IgE, allergies, was started on Xolair q2weeks (150, 75).    Started shots March 17 and has noted significant improvement in allergic rhinitis with control of her asthma (her ashtma is triggered by her nasal congestion/post nasal drip). Since her nasal congestion has been under signifcant control she now takes Flonase 2 sprays once a week (usually before mowing the grass/doing yard work) and she has self stopped her Advair (has no taken it in months). Of note, she still gets nasal congestion, but much improved. No wheezing/SOB or cough.    However, 3-4 days before shot is due she gets worsening of her nasal congestion, triggering asthma, thus will use Albuterol and Flonase during this time.     Of note, pulmonology recommend Singulair last visit, but she never started 2/2 neuropsych concerns.    She is considering SCIT, since her step son was on it with improvement in allergies.     She denies any atopic dermatitis, food or medication allergies.     Past medical history, medications, environmental, family, and social history were reviewed and are as documented in the encounter.        REVIEW OF SYSTEMS   A 12pt ROS was reviewed and was negative other than as noted below or as per HPI.      ?  PHYSICAL EXAM   ?Ht 170.2 cm (5' 7)  - Wt 83.5 kg (184 lb)  - Breastfeeding No  - BMI 28.82 kg/m??    Constitutional: Well-appearing, age appropriate female in no acute distress.   Eyes: Sclera and conjunctiva are clear.    ENMT: Nares are patent with no drainage; nasal mucosa is pink and not swollen; oropharynx is pink and moist with no ulcers, thrush, or post-nasal drainage.    Neck: Supple.   Lungs: Clear to auscultation bilaterally with no rales, rhonchi, or wheezing.   Heart: Regular rate and rhythm with no murmur.   Skin: Well-hydrated and free of rash.   Neurologic: Normal mental status with no gross abnormalities      RESULTS   ?Reviewed notes and recent results in Epic.       03/2020  IgE 263, comprehensive environmental panel: Cat 7.72, Dog 34.3, [place] grass 0.45, [place] grass 0.38, timothy grass 2.6, mouse 8.08    Total time spent on today's visit was over 60 minutes, including both face-to-face time and non face-to-face time, personally spent on review of chart, discussing labs and goals, discussing further workup , treatment options, referrals to specialists if needed, reviewing outside records if pertinent, answering patient questions, and coordinating care regarding allergen sensitivity/precautions as well as management strategy.      ?    It was a pleasure having the opportunity to meet with your patient today. Please contact me with any questions regarding today's visit.   ?   ?   Sincerely,    Elray Buba, MD  Allergy/Immunology Fellow    Patient seen and staffed with Dr. Manfred Shirts  ?   ?

## 2020-09-16 ENCOUNTER — Ambulatory Visit: Admit: 2020-09-16 | Discharge: 2020-09-17 | Payer: PRIVATE HEALTH INSURANCE

## 2020-09-16 DIAGNOSIS — J309 Allergic rhinitis, unspecified: Principal | ICD-10-CM

## 2020-09-16 DIAGNOSIS — J324 Chronic pansinusitis: Principal | ICD-10-CM

## 2020-09-16 DIAGNOSIS — J4540 Moderate persistent asthma, uncomplicated: Principal | ICD-10-CM

## 2020-09-16 MED ORDER — FLUTICASONE PROPIONATE 50 MCG/ACTUATION NASAL SPRAY,SUSPENSION
Freq: Every day | NASAL | 0 refills | 0.00000 days | Status: CP
Start: 2020-09-16 — End: 2021-09-16

## 2020-09-16 MED ORDER — AZELASTINE 137 MCG (0.1 %) NASAL SPRAY AEROSOL
Freq: Two times a day (BID) | NASAL | 11 refills | 30.00000 days | Status: CP
Start: 2020-09-16 — End: 2021-09-16

## 2020-09-16 NOTE — Unmapped (Addendum)
-please make sure you are taking your Flonase 2 sprays in each nostril daily and consider starting Astelin 1-2 sprays daily in each nostril (anti histamine nose spray) to help with additional nasal congestion (we have prescribed it)    -let us know if you want to start immunotherapy---message [place] via the portal     IMMUNOTHERAPY:    Allergen immunotherapy is a form of treatment designed to decrease your sensitivity to substances called allergens. Allergens, like cat dander or grass pollen, trigger your allergy symptoms when you are exposed to them. Specific allergy testing has identified what allergens you are sensitive to.     Allergen immunotherapy involves injecting increasing amounts of an allergen over several months with the goal of changing the way the immune system responds to the allergen. Your body responds to the injected amounts of a particular allergen, given in gradually increasing doses, by developing tolerance or desensitization to the allergen. As a result of these immune changes, immunotherapy can lead to decreased, minimal, or no allergy symptoms when you are exposed to the allergen and can even lead to long-lasting relief of allergy symptoms after treatment is completed.    The decision to begin immunotherapy is based on several factors including:   Severity of symptoms:  Length of allergy season and severity of symptoms.   How well medications and changes to your environment control your allergy symptoms.   Desire to avoid long-term medication use because of side-effects, cost, or preference.     Time:   Immunotherapy will require a significant time commitment. Patient non-adherence to the injection program, like frequently missing injections, can compromise the safety of the therapy making it more risky and less effective.    Risk:   May vary depending on age and other medical conditions, such as heart disease, high blood pressure, or poorly-controlled asthma.    Immunotherapy should be given under the supervision of a physician in a facility equipped with the proper staff and equipment to identify and treat adverse reactions to allergy injections. Ideally, immunotherapy should be given in the prescribing allergist's office, but if this is not possible, we will provide the supervising physician with comprehensive instructions about your immunotherapy treatment.    There are two phases to immunotherapy: a build-up phase and a maintenance phase. The build-up phase involves receiving injections with increasing amounts of the allergens. The maintenance phase begins when the effective therapeutic dose is reached. Once the maintenance dose is reached, there will be longer periods of time between immunotherapy injections. The usual interval between maintenance immunotherapy injections is every 4 weeks.    The benefits of immunotherapy, in terms of reduced allergy symptoms, can begin during the build-up phase but may take as long as 12 months on the maintenance dose. Improvement with immunotherapy may be progressive throughout the immunotherapy treatment period.     If there is no improvement after a year of maintenance immunotherapy, possible reasons for failure will be explored. If no apparent reason is found then the immunotherapy program will likely be discontinued and other treatment options will be pursued.     If immunotherapy is successful, maintenance treatment is generally continued for 3 to 5 years. The decision to stop immunotherapy will be discussed after 3 to 5 years of treatment. Some individuals may experience lasting remission of their allergy symptoms but others may relapse after discontinuing immunotherapy. Therefore, the decision to stop immunotherapy will be individualized.       Risks of Allergen Immunotherapy:  There are two types of adverse reactions that occur with immunotherapy: local or systemic reactions. Local reactions are fairly common and present as redness and swelling at the injection site on the arm. This can happen immediately, or several hours after the treatment. Local reactions can be treated with cool compresses, topical anti-inflammatory creams, or antihistamines.     Systemic reactions are usually mild and respond rapidly to medications. Systemic reactions are much less common than local reactions, and 2-3% of patients receiving immunotherapy are reported to experience some form of these reactions. Symptoms can include increased allergy symptoms such as sneezing, nasal congestion or hives. Rarely, a serious systemic reaction, called anaphylaxis, can develop after an immunotherapy injection. In addition to the symptoms associated with a mild systemic reaction, symptoms of anaphylaxis include swelling in the throat, wheezing or a sensation of tightness in the chest, nausea, dizziness or other symptoms. Especially if not properly treated, anaphylaxis can be fatal. On the basis of data from a large survey, the risk of a fatal anaphylactic reaction from allergen immunotherapy has been calculated to be 1 in 2.5 million injections, that translates to about 3 deaths per year in the [place]. Near-fatal reactions, meaning serious systemic reactions that do not result in death, occur once in every 200,000 injections. There have been no deaths due to desensitization for the past 2 years and the [place].      Systemic reactions require immediate treatment. Most serious systemic reactions develop within 30 minutes of the allergy injections and this is why you will wait in the office and be observed for 30 minutes after each injection. We are trained to monitor for such reactions and identify and treat systemic reactions. Any adverse reactions that occur after you leave our office, either local or systemic, needs to be reported to [place].    Please ask [place] any additional questions you may have regarding allergen immunotherapy. Before we start, we ask that you sign a form that has this information. The purpose of your signature is to indicate you have read this material, understand the goals of the proposed treatment, and understand its risks.    More information about allergen immunotherapy can be found on the American Academy of Allergy, Asthma, and Immunology (AAAAI) web site: [employer]Flower.dk.

## 2020-10-03 MED ORDER — DEXTROAMPHETAMINE-AMPHETAMINE 10 MG TABLET
ORAL_TABLET | Freq: Two times a day (BID) | ORAL | 0 refills | 30 days | Status: CP | PRN
Start: 2020-10-03 — End: 2020-11-02

## 2020-10-03 NOTE — Unmapped (Signed)
Complex Case Management Pre-Assessment Note  Pre-Assessment  NOTE      Summary:  Care Coordinator spoke with patient and verified correct patient using two identifiers today for enrollment in Complex Case Management. Informed patient of Complex Case Management services. Pt has agreed to participate in the Complex Case Management program. Care Coordinator contact information was provided to patient. Care Coordinator informed patient that Case Manager would be calling to complete initial assessment.     General Case management Questions:     General Care Management - Patient Level    Assessment completed with: patient[SA1.1]  Patient lives with: significant other (Comment: has 3 children that live with her)[SA1.1]  Support system: significant other[SA1.1]  Type of residence: private residence[SA1.1]  Transportation means: personal vehicle[SA1.1]  Does your health interfere with activities of daily living?: sleep, eating, self-care, work, exercise (Comment: pt has asthma and is taking meds, has ADHD and also taking meds and she feels they do interfere with her sleep sometimes)[SA1.1]  Exercise: yes[SA1.2]  Follow special diet?: regular (Comment: Pt prioritizes eating healthy and avoid dairy products.)[SA1.2]  Interested in seeing dietician?: No[SA1.2]  Experiencing side effects from current medications: Yes (Comment: sleep disruption, headaches,stomach issues from ADHD meds)[SA1.2]  Interested in seeing pharmacist?: No[SA1.2]  Difficulty keeping appointments: Yes (Comment: occassinally due to caring for children)[SA1.2]  Need assistance with community resources?: No[SA1.2]  Other significant issues impacting care?: Sleep:-  Eating-With her ADHD medication when it wears off the pt wants to eat.   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 Pt states when she is PMS she eats a lot.   Pt staes she feels as a mom she has the tendency to over-do things (stuck on things). Pt sates within the last 4 years she has learnt to cope with all her concerns (ADHD/depression).   Pt does a lot of yard work. Pt currently not working but previously was working and had to stop as she would get sick  [SA1.3]     Attribution     SA1.1 Jamie Webb 10/03/20 10:50    SA1.2 Jamie Webb 10/03/20 10:24    SA1.3 Jamie Webb 10/03/20 10:54           History Review:     Past Medical History:   Diagnosis Date   ??? Allergic rhinitis    ??? Anxiety    ??? Asthma    ??? Attention deficit disorder     Testing done at [facility] in [place]   ??? Chronic sinusitis    ??? Depression    ??? Dysmenorrhea    ??? Financial difficulties    ??? Headache    ??? Hx of chronic bronchitis      Caregiver burden No   Cognitive Impairment No   Falls Risk No   Financial difficulty Yes   Frail Elderly No   Hearing impairment/loss No   Homeless No   Impaired mobility No   Inadequate social/family support No   Ineffective family coping No   Low Literacy No   Nonadherence to medication No   Non-english speaking No   Terminal Illness/Hospice No   Transportation barriers No   Visual impairment No     Past Surgical History:   Procedure Laterality Date   ??? CESAREAN SECTION       Family History   Problem Relation Age of Onset   ??? Anxiety disorder Mother    ??? Depression Mother    ??? ADD / ADHD Mother    ???  Hypertension Father    ??? Heart attack Father    ??? ADD / ADHD Father    ??? Diabetes Father    ??? No Known Problems Sister    ??? No Known Problems Brother    ??? No Known Problems Maternal Aunt    ??? No Known Problems Maternal Uncle    ??? No Known Problems Paternal Aunt    ??? Diabetes Paternal Uncle    ??? Cancer Maternal Grandmother         breast   ??? No Known Problems Maternal Grandfather    ??? No Known Problems Paternal Grandmother    ??? Diabetes Paternal Grandfather    ??? Cancer Other         maternal great auntx2-breast   ??? Anesthesia problems Neg Hx    ??? Broken bones Neg Hx    ??? Clotting disorder Neg Hx    ??? Collagen disease Neg Hx    ??? Dislocations Neg Hx    ??? Fibromyalgia Neg Hx ??? Gout Neg Hx    ??? Hemophilia Neg Hx    ??? Osteoporosis Neg Hx    ??? Rheumatologic disease Neg Hx    ??? Scoliosis Neg Hx    ??? Severe sprains Neg Hx    ??? Sickle cell anemia Neg Hx    ??? Spinal Compression Fracture Neg Hx      Ready to quit: Not Answered  Counseling given: Not Answered  Comment: smokes some infrequently    Ready to quit: Not Answered  Counseling given: Not Answered  Comment: smokes some infrequently                     Social History     Substance and Sexual Activity   Drug Use No     Social History     Substance and Sexual Activity   Sexual Activity Not on file       Self-Efficacy Score:  SCORE: 7 (10/03/2020 10:24 AM)      Medications:   Outpatient Encounter Medications as of 10/03/2020   Medication Sig Dispense Refill   ??? albuterol HFA 90 mcg/actuation inhaler Inhale 2 puffs two (2) times a day as needed for wheezing. 8 g 5   ??? azelastine (ASTELIN) 137 mcg (0.1 %) nasal spray 1 spray into each nostril Two (2) times a day. Use in each nostril as directed 1.2 mL 11   ??? dextroamphetamine-amphetamine (ADDERALL) 10 mg tablet Take 1 tablet (10 mg total) by mouth two (2) times a day as needed (attention). 60 tablet 0   ??? dextroamphetamine-amphetamine (ADDERALL) 10 mg tablet Take 1 tablet (10 mg total) by mouth two (2) times a day as needed (attention). 60 tablet 0   ??? [START ON 10/31/2020] dextroamphetamine-amphetamine (ADDERALL) 10 mg tablet Take 1 tablet (10 mg total) by mouth two (2) times a day as needed (attention). 60 tablet 0   ??? empty container Misc USE AS DIRECTED 1 each 2   ??? EPINEPHrine (EPIPEN) 0.3 mg/0.3 mL injection Inject 0.3 mL (0.3 mg total) into the muscle once for 1 dose. 2 each 0   ??? famotidine (PEPCID) 20 MG tablet Take 1 tablet (20 mg total) by mouth daily. 30 tablet 1   ??? fluoride, sodium, 1.1 % Pste      ??? fluticasone propion-salmeteroL (ADVAIR HFA) 230-21 mcg/actuation inhaler Inhale 2 puffs Two (2) times a day. 12 g 3   ??? fluticasone propionate (FLONASE) 50 mcg/actuation nasal spray 2 sprays  into each nostril daily. 16 g 0   ??? fluticasone propionate (FLOVENT HFA) 220 mcg/actuation inhaler Inhale 3 puffs once as needed.     ??? hydrOXYzine (ATARAX) 25 MG tablet Take 1 tablet (25 mg total) by mouth daily as needed for anxiety. 90 tablet 1   ??? lisdexamfetamine (VYVANSE) 60 MG cap capsule Take 1 capsule (60 mg total) by mouth every morning. 30 capsule 0   ??? [START ON 10/05/2020] lisdexamfetamine (VYVANSE) 60 MG cap capsule Take 1 capsule (60 mg total) by mouth every morning. 30 capsule 0   ??? [START ON 11/04/2020] lisdexamfetamine (VYVANSE) 60 MG cap capsule Take 1 capsule (60 mg total) by mouth every morning. 30 capsule 0   ??? [EXPIRED] lisdexamfetamine (VYVANSE) 60 MG capsule Take 1 capsule (60 mg total) by mouth every morning. 30 capsule 0   ??? omalizumab (XOLAIR) 150 mg/mL syringe Inject the contents of 1 syringe (150 mg total) under the skin every fourteen (14) days. Inject along with 75 mg syringe for total dose of 225 mg every 14 days. 2 mL 12   ??? omalizumab (XOLAIR) 75 mg/0.5 mL syringe Inject the contents of 1 syringe (75 mg total) under the skin every fourteen (14) days. Inject along with 150 mg syringe for total dose of 225 mg every 14 days. 1 mL 12     No facility-administered encounter medications on file as of 10/03/2020.        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 Difficulty of Paying Living Expenses: Very hard      Food Insecurity: Food Insecurity Present   ??? Worried About Programme researcher, broadcasting/film/video in the Last Year: Sometimes true   ??? Ran Out of Food in the Last Year: Sometimes true      Transportation Needs: Unmet Transportation Needs   ??? Lack of Transportation (Medical): Yes   ??? Lack of Transportation (Non-Medical): No      Physical Activity: Sufficiently Active   ??? Days of Exercise per Week: 5 days   ??? Minutes of Exercise per Session: 60 min      Stress: Stress Concern Present   ??? Feeling of Stress : Very much      Intimate Partner Violence: Not At Risk   ??? Fear of Current or Ex-Partner: No   ??? Emotionally Abused: No   ??? Physically Abused: No   ??? Sexually Abused: No      Alcohol Use: Not At Risk   ??? How often do you have a drink containing alcohol?: Monthly or less   ??? How many drinks containing alcohol do you have on a typical day when you are drinking?: 1 - 2   ??? How often do you have 5 or more drinks on one occasion?: Never      Tobacco Use: High Risk   ??? Smoking Tobacco Use: Current Some Day Smoker   ??? Smokeless Tobacco Use: Never Used      Depression: Not on file        Upcoming Appointment (s):   Future Appointments   Date Time Provider Department Center   11/30/2020 10:45 AM Frederik Pear, MD [facility]   12/05/2020  9:30 AM Romeo Apple, PMHNP [facility]   03/23/2021 10:30 AM Manfred Shirts, MD [facility]         This patient is currently under review for Complex Case Management services. For progress, care plan changes, updates  or recent discharges please contact CM.          Jamie Webb

## 2020-10-04 MED ORDER — DEXTROAMPHETAMINE-AMPHETAMINE 10 MG TABLET
ORAL_TABLET | Freq: Two times a day (BID) | ORAL | 0 refills | 30 days | Status: CP | PRN
Start: 2020-10-04 — End: 2020-11-03

## 2020-10-05 ENCOUNTER — Other Ambulatory Visit: Payer: Self-pay

## 2020-10-05 ENCOUNTER — Ambulatory Visit
Admission: EM | Admit: 2020-10-05 | Discharge: 2020-10-05 | Disposition: A | Discharge status: Home / Self Care | Payer: Medicaid Other

## 2020-10-05 ENCOUNTER — Encounter: Payer: Self-pay | Admitting: Emergency Medicine

## 2020-10-05 DIAGNOSIS — J45901 Unspecified asthma with (acute) exacerbation: Secondary | ICD-10-CM

## 2020-10-05 DIAGNOSIS — R059 Cough, unspecified: Secondary | ICD-10-CM | POA: Diagnosis not present

## 2020-10-05 DIAGNOSIS — R062 Wheezing: Secondary | ICD-10-CM | POA: Diagnosis not present

## 2020-10-05 MED ORDER — LISDEXAMFETAMINE 60 MG CAPSULE
ORAL_CAPSULE | Freq: Every morning | ORAL | 0 refills | 30 days | Status: CP
Start: 2020-10-05 — End: 2020-11-03

## 2020-10-05 MED ORDER — PREDNISONE 10 MG PO TABS: ORAL_TABLET | ORAL | 0 refills | Status: DC

## 2020-10-05 NOTE — ED Triage Notes (Addendum)
Pt c/o cough, wheezing, nasal congestion. Started about a week ago. She states she has asthma. She state she has taken 2 breathing treatments today. She took a home covid test and was negative. Declines covid testing today.

## 2020-10-05 NOTE — Discharge Instructions (Addendum)
Continue your at home medications and allergy injections.  Continue using your breathing treatments.  I have sent prednisone.  If this does not help you or you develop a fever, worsening cough or increased breathing difficulty may need to be seen again have a chest x-ray or consideration of COVID testing.
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
## 2020-10-05 NOTE — Unmapped (Signed)
COMPLEX CASE MANAGEMENT   Brief Note    Called pt and scheduled initial CCM assessment phone call for Friday, 10/07/20, at 9:00am

## 2020-10-07 NOTE — Unmapped (Addendum)
Complex Case Management  Assessment  NOTE  11/02/2020      Summary:  Advocate Case Manager spoke with patient and verified correct patient using two identifiers today for enrollment in Complex Case Management. Informed patient of Complex Case Management services. Pt has agreed to participate in the Complex Case Management program. Case Manager contact information was provided to patient.     General Case management Questions:   General Care Management - Patient Level    Assessment completed with: patient[SA1.1]  Patient lives with: significant other (Comment: has 3 children that live with her)[SA1.1]  Support system: significant other[SA1.1]  Type of residence: private residence[SA1.1]  Transportation means: personal vehicle[SA1.1]  Does your health interfere with activities of daily living?: sleep, eating, self-care, work, exercise (Comment: pt has asthma and is taking meds, has ADHD and also taking meds and she feels they do interfere with her sleep sometimes)[SA1.1]  Exercise: yes[SA1.2]  Follow special diet?: regular (Comment: Pt prioritizes eating healthy and avoid dairy products.)[SA1.2]  Interested in seeing dietician?: No[SA1.2]  Experiencing side effects from current medications: Yes (Comment: sleep disruption, headaches,stomach issues from ADHD meds)[SA1.2]  Interested in seeing pharmacist?: No[SA1.2]  Difficulty keeping appointments: Yes (Comment: occassinally due to caring for children)[SA1.2]  Need assistance with community resources?: No[SA1.2]  Other significant issues impacting care?: Sleep:-  Eating-With her ADHD medication when it wears off the pt wants to eat.   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 Pt states when she is PMS she eats a lot.   Pt staes she feels as a mom she has the tendency to over-do things (stuck on things). Pt sates within the last 4 years she has learnt to cope with all her concerns (ADHD/depression).   Pt does a lot of yard work. Pt currently not working but previously was working and had to stop as she would get sick  [SA1.3]     Attribution     SA1.1 Sammy Amimo 10/03/20 10:50    SA1.2 Sammy Amimo 10/03/20 10:24    SA1.3 Sammy Amimo 10/03/20 10:54           Primary Health Concerns:  What is your/your child's primary health concern?: My sinuses...asthma. My mental health is very important to me, but it's been much more manageable at this stage in my life. (Scheduled for sinus surgery in November.)    Assessment:  Completed By: Patient    Self-Health:  How would you describe you or your child's current physical health?: (!) Fair  How would you describe you or your child's current mental health?: Good  Behavioral health services:: Psychiatry  What concerns would cause you to go to the ED (Emergency Department) rather than your PCP/other provider? : If I were injured or couldn't breathe or something really drastic.     Oral Health - How would you describe you or your child's oral health?: Fair  Have you had a dental or oral exam in the last year?: Yes    ACS Disability Status:   Are you deaf or do you have serious difficulty hearing?: No  Do you wear hearing aids?: No  Do you use any assistive devices to communicate?: No  Summary of Vision, Driving, Reading/Writing, and Disability Status : Pt denies difficulties with vision or hearing. 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 Reads and writes.    Self Efficacy Assessment:  Childhood Experiences:  Did you have any exposure to Adverse Childhood experiences or child trauma?: (!) Yes (Did not want to answer specific questions.)    Pediatric ACE:                                     Support Summary:  Summary of Caregiver/Social Support: Significant other is supportive. Extended family is also supportive. Has close friends.    ADL/IADL:  Dressing - Ability to perform: Independent  Grooming - Ability to perform: Independent  Feeding - Ability to perform: Independent  Bathing - Ability to perform: Independent  Toileting - Ability to perform: Independent  Transfering- Ability to perform: Independent  Continence: Futures trader - Do you have a problem with any of the following:: Independent    Vision:  Patient's Vision Adequate to Safely Complete Daily Activities: Yes    Driving Concerns:  Do you drive?: Yes   Mode of Transportation: Car    Reading/Writing:  Are you able to read?: Yes   Are you able to write?: Yes  Oriented to person, place, time, situation: Oriented x 4    Patient/Family Memory Concerns:  Do you have concerns about your memory?: No   Does anyone in your family have concerns about your memory?: No    Learning and Disability:  History of attendance at a Special Education program or setting?: No   Intellectual Disability (formally Mental Retardation)?: No  Learning Disability?: No    CM Assessment:  Describe the patient's cognitive status:: 1  Summary of Memory and Cognitive Status: Denies memory concerns. Alert and oriented.    Barriers to Care:  What are the patient's barriers? (Select all that apply): (!) Financial Stress, Psychiatric Diagnosis    ACP Review:  ACP Reviewed: Yes (wants paperwork mailed.)    Chronic Pain:  Do you/ your child have chronic pain?: No    Community Resources:   Which Services?: Religious Organizations (Attends church approximately once per month.)     Dietary Needs:   Do you/your child have any special dietary needs or restrictions?: (!) Yes (Dairy causes stomach problems.)    Home Health:  Any Home Health?: None    Insurance Benefits:  What is your current understanding of your insurance benefits?: Good    Culture and Beliefs:  Is there anything I should know about your culture, beliefs, or religious practices that would help me take better care of you?: No    Life Planing Summary:  Summary of Advanced Care and Life Planning: Pt wants ACP paperwork sent to her. No up coming life transitions.    CM Summary:  Patient needs/concerns addressed today:: Identified items to be included on care plan.  Care Plan items covered today:: Needs ACP paperwork, wants to be linked to counselor, needs food and utility assistance resources.  Care plan items planned for next conversation:: linkage to mental health counselor.        History Review:   Past Medical History:   Diagnosis Date   ??? Allergic rhinitis    ??? Anxiety    ??? Asthma    ??? Attention deficit disorder     Testing done at [facility] clinic in [place]   ??? Chronic sinusitis    ??? Depression    ??? Dysmenorrhea    ??? Financial difficulties    ??? Headache    ??? Hx of chronic bronchitis  Caregiver burden No   Cognitive Impairment No   Falls Risk No   Financial difficulty Yes   Frail Elderly No   Hearing impairment/loss No   Homeless No   Impaired mobility No   Inadequate social/family support No   Ineffective family coping No   Low Literacy No   Nonadherence to medication No   Non-english speaking No   Terminal Illness/Hospice No   Transportation barriers No   Visual impairment No     Past Surgical History:   Procedure Laterality Date   ??? CESAREAN SECTION       Family History   Problem Relation Age of Onset   ??? Anxiety disorder Mother    ??? Depression Mother    ??? ADD / ADHD Mother    ??? Hypertension Father    ??? Heart attack Father    ??? ADD / ADHD Father    ??? Diabetes Father    ??? No Known Problems Sister    ??? No Known Problems Brother    ??? No Known Problems Maternal Aunt    ??? No Known Problems Maternal Uncle    ??? No Known Problems Paternal Aunt    ??? Diabetes Paternal Uncle    ??? Cancer Maternal Grandmother         breast   ??? No Known Problems Maternal Grandfather    ??? No Known Problems Paternal Grandmother    ??? Diabetes Paternal Grandfather    ??? Cancer Other         maternal great auntx2-breast   ??? Anesthesia problems Neg Hx    ??? Broken bones Neg Hx    ??? Clotting disorder Neg Hx    ??? Collagen disease Neg Hx    ??? Dislocations Neg Hx    ??? Fibromyalgia Neg Hx    ??? Gout Neg Hx    ??? Hemophilia Neg Hx    ??? Osteoporosis Neg Hx    ??? Rheumatologic disease Neg Hx    ??? Scoliosis Neg Hx    ??? Severe sprains Neg Hx    ??? Sickle cell anemia Neg Hx    ??? Spinal Compression Fracture Neg Hx      Ready to quit: Not Answered  Counseling given: Not Answered  Comment: smokes some infrequently    Ready to quit: Not Answered  Counseling given: Not Answered  Comment: smokes some infrequently                     Social History     Substance and Sexual Activity   Drug Use No       Medications:   Outpatient Encounter Medications as of 10/07/2020   Medication Sig Dispense Refill   ??? albuterol HFA 90 mcg/actuation inhaler Inhale 2 puffs two (2) times a day as needed for wheezing. 8 g 5   ??? dextroamphetamine-amphetamine (ADDERALL) 10 mg tablet Take 1 tablet (10 mg total) by mouth two (2) times a day as needed (attention). 60 tablet 0   ??? dextroamphetamine-amphetamine (ADDERALL) 10 mg tablet Take 1 tablet (10 mg total) by mouth two (2) times a day as needed (attention). 60 tablet 0   ??? dextroamphetamine-amphetamine (ADDERALL) 10 mg tablet Take 1 tablet (10 mg total) by mouth two (2) times a day as needed (attention). 60 tablet 0   ??? empty container Misc USE AS DIRECTED 1 each 2   ??? famotidine (PEPCID) 20 MG tablet Take 1 tablet (20 mg total) by mouth daily.  30 tablet 1   ??? fluoride, sodium, 1.1 % Pste      ??? fluticasone propion-salmeteroL (ADVAIR HFA) 230-21 mcg/actuation inhaler Inhale 2 puffs Two (2) times a day. 12 g 3   ??? fluticasone propionate (FLONASE) 50 mcg/actuation nasal spray 2 sprays into each nostril daily. 16 g 0   ??? fluticasone propionate (FLOVENT HFA) 220 mcg/actuation inhaler Inhale 3 puffs once as needed.     ??? hydrOXYzine (ATARAX) 25 MG tablet Take 1 tablet (25 mg total) by mouth daily as needed for anxiety. 90 tablet 1   ??? lisdexamfetamine (VYVANSE) 60 MG cap capsule Take 1 capsule (60 mg total) by mouth every morning. 30 capsule 0   ??? [START ON 11/04/2020] lisdexamfetamine (VYVANSE) 60 MG cap capsule Take 1 capsule (60 mg total) by mouth every morning. 30 capsule 0   ??? omalizumab (XOLAIR) 150 mg/mL syringe Inject the contents of 1 syringe (150 mg total) under the skin every fourteen (14) days. Inject along with 75 mg syringe for total dose of 225 mg every 14 days. 2 mL 12   ??? omalizumab (XOLAIR) 75 mg/0.5 mL syringe Inject the contents of 1 syringe (75 mg total) under the skin every fourteen (14) days. Inject along with 150 mg syringe for total dose of 225 mg every 14 days. 1 mL 12   ??? azelastine (ASTELIN) 137 mcg (0.1 %) nasal spray 1 spray into each nostril Two (2) times a day. Use in each nostril as directed 1.2 mL 11   ??? EPINEPHrine (EPIPEN) 0.3 mg/0.3 mL injection Inject 0.3 mL (0.3 mg total) into the muscle once for 1 dose. 2 each 0   ??? [EXPIRED] lisdexamfetamine (VYVANSE) 60 MG cap capsule Take 1 capsule (60 mg total) by mouth every morning. 30 capsule 0     No facility-administered encounter medications on file as of 10/07/2020.        Social History Review:   Physicist, medical Strain: High Risk   ??? Difficulty of Paying Living Expenses: Very hard      Food Insecurity: Food Insecurity Present   ??? Worried About Programme researcher, broadcasting/film/video in the Last Year: Sometimes true   ??? Ran Out of Food in the Last Year: Sometimes true      Transportation Needs: Unmet Transportation Needs   ??? Lack of Transportation (Medical): Yes   ??? Lack of Transportation (Non-Medical): No      Physical Activity: Sufficiently Active   ??? Days of Exercise per Week: 5 days   ??? Minutes of Exercise per Session: 60 min      Stress: Stress Concern Present   ??? Feeling of Stress : Very much      Intimate Partner Violence: Not At Risk   ??? Fear of Current or Ex-Partner: No   ??? Emotionally Abused: No   ??? Physically Abused: No   ??? Sexually Abused: No      Alcohol Use: Not At Risk   ??? How often do you have a drink containing alcohol?: Monthly or less   ??? How many drinks containing alcohol do you have on a typical day when you are drinking?: 1 - 2   ??? How often do you have 5 or more drinks on one occasion?: Never      Tobacco Use: High Risk   ??? Smoking Tobacco Use: Current Some Day Smoker   ??? Smokeless Tobacco Use: Never Used      Depression: Not on file  PHQ-2 Score:      PHQ-9 Score:        Emergency Back-up Plan   Does the patient currently have an emergency back-up plan in the event of a power outage or natural disaster? yes   If no, I would like to assist you to identify a written plan to account for short and long-term needs in the event of an emergency situation, natural disaster, power outage or equipment failure. This plan will also include identification of individuals in which you are able to contact to assist in an event of an emergency including emergency response system, provider, friends, family or alternate care provider.  Patients's Back-up Plan: Pt has access to flashlights and batteries.     Care Plan:   Care plan shared with patient and provider: Yes    Patient Rights and Responsibilities:  Patient Rights and Responsibilities reviewed and provided with enclosed Welcome Letter via Care Plan : Yes    Upcoming Appointment (s):   Future Appointments   Date Time Provider Department Center   12/13/2020  9:30 AM Romeo Apple, PMHNP [facility]   12/28/2020 10:45 AM Frederik Pear, MD [facility]   03/23/2021 10:30 AM Manfred Shirts, MD [facility]       This patient is currently under review for Complex Case Management services. For progress, care plan changes, updates or recent discharges please contact CM.          Jahmai Finelli Evangeline Dakin, MSW    This patient is currently receiving Complex Case Management services.      Primary Case Manager: Christerpher Clos, LCSW [phone]  Please contact CM for care plan changes, updates or recent discharges.    High Risk Drivers: Complex Diagnosis  Primary Disease Process: Depression Anxiety  Current Residence: Home with partner  Primary Medical Home: [facility] Hind, PA???s office  [facility]  Current services: none  Patient's Primary Concern is/goals are: Improve chronic condition management and Reduce food insecurity  Barriers: Financial Stress and Psychiatric Diagnosis  Strengths: Self-advocacy and Family connection  Supports: Spouse/Partner  Interventions provided: Contact Information Provided, Introduction to ICM, Medication Reconciliation and Supportive Listening   Follow up with ICM Team Member: 4 weeks

## 2020-10-07 NOTE — Unmapped (Signed)
[facility] Refill Coordination Note    Specialty Medication(s) to be Shipped:   CF/Pulmonary: -Xolair 150mg /ml Syr & Xolair 75mg /0.45ml Syr  Other medication(s) to be shipped: No additional medications requested for fill at this time     [name], DOB: 02-24-1990  Phone: [phone] (home)     All above HIPAA information was verified with patient.     Was a Nurse, learning disability used for this call? No    Completed refill call assessment today to schedule patient's medication shipment from the [facility] [phone]).  All relevant notes have been reviewed.     Specialty medication(s) and dose(s) confirmed: Regimen is correct and unchanged.   Changes to medications: [name] reports no changes at this time.  Changes to insurance: No  New side effects reported not previously addressed with a pharmacist or physician: None reported  Questions for the pharmacist: No    Confirmed patient received a Conservation officer, historic buildings and a Surveyor, mining with first shipment. The patient will receive a drug information handout for each medication shipped and additional FDA Medication Guides as required.       DISEASE/MEDICATION-SPECIFIC INFORMATION        For CF patients: CF [facility] Active? No-not enrolled    SPECIALTY MEDICATION ADHERENCE     Medication Adherence    Patient reported X missed doses in the last month: 0  Specialty Medication: Xolair 150mg /ml Syr  Patient is on additional specialty medications: Yes  Additional Specialty Medications: Xolair 75mg /0.17ml syr  Patient Reported Additional Medication X Missed Doses in the Last Month: 0  Patient is on more than two specialty medications: No  Informant: patient  Reliability of informant: reliable  Reasons for non-adherence: no problems identified  Confirmed plan for next specialty medication refill: delivery by pharmacy  Refills needed for supportive medications: not needed        Were doses missed due to medication being on hold? No    Xolair 150mg /ml Syr: 0 days of medicine on hand (Next Injection: 10/17/2020)  Xolair 75mg /0.24ml Syr: 0 days of medicine on hand (Next Injection: 10/17/2020)    REFERRAL TO PHARMACIST     Referral to the pharmacist: Not needed    [facility]     Shipping address confirmed in Epic.     Delivery Scheduled: Yes, Expected medication delivery date: 10/12/2020.     Medication will be delivered via Same Day Courier to the prescription address in Epic WAM.    Erhardt Dada P Allena Katz   [facility] Specialty Technician

## 2020-10-12 MED FILL — XOLAIR 75 MG/0.5 ML SUBCUTANEOUS SYRINGE: SUBCUTANEOUS | 28 days supply | Qty: 1 | Fill #7

## 2020-10-12 MED FILL — XOLAIR 150 MG/ML SUBCUTANEOUS SYRINGE: SUBCUTANEOUS | 28 days supply | Qty: 2 | Fill #7

## 2020-10-17 ENCOUNTER — Ambulatory Visit
Admission: EM | Admit: 2020-10-17 | Discharge: 2020-10-17 | Disposition: A | Discharge status: Home / Self Care | Payer: Medicaid Other | Attending: Family Medicine | Admitting: Family Medicine

## 2020-10-17 ENCOUNTER — Ambulatory Visit (INDEPENDENT_AMBULATORY_CARE_PROVIDER_SITE_OTHER): Payer: Medicaid Other

## 2020-10-17 ENCOUNTER — Other Ambulatory Visit: Payer: Self-pay

## 2020-10-17 DIAGNOSIS — J209 Acute bronchitis, unspecified: Secondary | ICD-10-CM | POA: Diagnosis not present

## 2020-10-17 DIAGNOSIS — R059 Cough, unspecified: Secondary | ICD-10-CM | POA: Diagnosis not present

## 2020-10-17 MED ORDER — DOXYCYCLINE HYCLATE 100 MG PO CAPS: 100.0000 mg | ORAL_CAPSULE | Freq: Two times a day (BID) | ORAL | 0 refills | Status: DC

## 2020-10-17 MED ORDER — PROMETHAZINE-DM 6.25-15 MG/5ML PO SYRP: 5.0000 mL | ORAL_SOLUTION | Freq: Four times a day (QID) | ORAL | 0 refills | Status: DC | PRN
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
## 2020-10-17 NOTE — Discharge Instructions (Addendum)
Flovent twice daily.  Continue Albuterol.  Antibiotic as prescribed.  Cough medication as directed.  Take care  Dr. Adriana Simas

## 2020-10-17 NOTE — ED Triage Notes (Signed)
Patient states that she has been coughing since for 3 weeks now. States that she was seen here on the 24th and given prednisone, states that she worsened after completing and is concerned that she may have pneumonia.

## 2020-10-28 NOTE — Unmapped (Addendum)
[facility] Refill Coordination Note    Specialty Medication(s) to be Shipped:   CF/Pulmonary: -Xolair 75mg /0.95ml Syrg & Xolair 150mg /109ml Syrg  Other medication(s) to be shipped: No additional medications requested for fill at this time     Jamie Webb, DOB: 10/24/1990  Phone: [phone] (home)     All above HIPAA information was verified with patient.     Was a Nurse, learning disability used for this call? No    Completed refill call assessment today to schedule patient's medication shipment from the [facility] [phone]).  All relevant notes have been reviewed.     Specialty medication(s) and dose(s) confirmed: Regimen is correct and unchanged.   Changes to medications: Jamie Webb reports no changes at this time.  Changes to insurance: No  New side effects reported not previously addressed with a pharmacist or physician: None reported  Questions for the pharmacist: No    Confirmed patient received a Conservation officer, historic buildings and a Surveyor, mining with first shipment. The patient will receive a drug information handout for each medication shipped and additional FDA Medication Guides as required.       DISEASE/MEDICATION-SPECIFIC INFORMATION        For CF patients: CF [employer] Active? No-not enrolled    SPECIALTY MEDICATION ADHERENCE     Medication Adherence    Patient reported X missed doses in the last month: 0  Specialty Medication: Xolair 75mg /0.5ml Syrg  Patient is on additional specialty medications: Yes  Additional Specialty Medications: Xolair 150mg /19ml Syrg  Patient Reported Additional Medication X Missed Doses in the Last Month: 0  Patient is on more than two specialty medications: No  Informant: patient  Reliability of informant: reliable  Reasons for non-adherence: no problems identified  Confirmed plan for next specialty medication refill: delivery by pharmacy  Refills needed for supportive medications: not needed        Were doses missed due to medication being on hold? No    Xolair 75mg /0.69ml Syrg: 3 days of medicine on hand (Next Injection: 10/31/20 & 11/14/20)  Xolair 150mg /66ml Syrg: 3 days of medicine on hand (Next Injection: 10/31/20 & 11/14/20)    REFERRAL TO PHARMACIST     Referral to the pharmacist: Not needed    [facility]     Shipping address confirmed in Epic.     Delivery Scheduled: Yes, Expected medication delivery date: 11/11/2020.     Medication will be delivered via Same Day Courier to the prescription address in Epic [facility].    Jamie Webb   [facility] Specialty Technician

## 2020-10-31 MED ORDER — DEXTROAMPHETAMINE-AMPHETAMINE 10 MG TABLET
ORAL_TABLET | Freq: Two times a day (BID) | ORAL | 0 refills | 30.00000 days | Status: CP | PRN
Start: 2020-10-31 — End: 2020-11-30

## 2020-11-04 MED ORDER — LISDEXAMFETAMINE 60 MG CAPSULE
ORAL_CAPSULE | Freq: Every morning | ORAL | 0 refills | 30 days | Status: CP
Start: 2020-11-04 — End: 2020-12-03

## 2020-11-08 NOTE — Unmapped (Signed)
Complex Case Management  SUMMARY NOTE    Attempted to contact pt today at Home number to follow up for Complex Case Management services. No answer, unable to leave message; 1st attempt    Discuss at next visit: find in-network behavioral health provider       Laurene Footman - High Risk Care Coordinator   [facility] Clinical Services  [address] [place], [place] [address]  p. [phone]  [email or web address]

## 2020-11-11 MED FILL — XOLAIR 75 MG/0.5 ML SUBCUTANEOUS SYRINGE: SUBCUTANEOUS | 28 days supply | Qty: 1 | Fill #8

## 2020-11-11 MED FILL — XOLAIR 150 MG/ML SUBCUTANEOUS SYRINGE: SUBCUTANEOUS | 28 days supply | Qty: 2 | Fill #8

## 2020-11-11 NOTE — Unmapped (Signed)
Complex Case Management  SUMMARY NOTE    Attempted to contact pt today at Home number to follow up for Complex Case Management services. No answer, unable to leave message; 2nd attempt, letter sent.    Discuss at next visit: finding behavioral health provider       Laurene Footman - High Risk Care Coordinator   [facility] Clinical Services  [address] [place], [place] [address]  p. [phone]  [email or web address]

## 2020-11-16 NOTE — Unmapped (Signed)
Complex Case Management  SUMMARY NOTE    Attempted to contact pt today at Cell number to follow up for Complex Case Management services. No answer, unable to leave message; 1st attempt. Sent pt an IB message explaining we are trying to reach her.     Discuss at next visit: check on pt, assess depression severity

## 2020-11-16 NOTE — Unmapped (Signed)
COMPLEX CASE MANAGEMENT   Brief Note        This [name] sent patient Advanced Care Planning documents via mychart as requested by patient's case manager. Patient was advised to take a completed copy to her PCP to put into her medical chart and reach out with any questions or concerns.    Laurene Footman - High Risk Care Coordinator   [facility] Clinical Services  [address], [address] [place], [place] [address]  p. [phone]  [email or web address]

## 2020-11-22 NOTE — Unmapped (Signed)
COMPLEX CASE MANAGEMENT   Brief Note          This [facility] sent patient information via mychart on food and utility bill assistance via mychart as requested by patient's case Production designer, theatre/television/film. Patient advised to reach out with any questions or concerns.       Laurene Footman - High Risk Care Coordinator   [facility] Clinical Services  [address] [place], [place] [address]  p. [phone]  [email or web address] [email or web address]

## 2020-11-23 NOTE — Unmapped (Signed)
COMPLEX CASE MANAGEMENT   Brief Note      This [facility] tried to reach out to patient to provide information on establishing care with a behavioral health provider that accepts her insurance. [facility] wasn't able to leave a vm so information was sent via mychart. Patient is advised to reach out with any questions or concerns.      Laurene Footman - High Risk Care Coordinator   [facility] Clinical Services  [address], [address] [place], [place] [address]  p. [phone]  [email or web address]

## 2020-11-28 NOTE — Unmapped (Signed)
COMPLEX CASE MANAGEMENT   Brief Note        After not reaching patient this [facility] reached out to CM who did speak with patient and will confirm receipt of ACP.      Laurene Footman - High Risk Care Coordinator   [facility] Clinical Services  [address] [place], [place] [address]  p. [phone]  [email or web address] [email or web address]

## 2020-11-28 NOTE — Unmapped (Signed)
COMPLEX CASE MANAGEMENT   FOLLOW UP NOTE  Summary:  Complex Case Manager  spoke with patient and verified correct patient using two identifiers today for Complex Case Management follow up. Patient currently resides at Home. Primary concern is anxiety.      Subjective:    Called pt to check in and provide depression/anxiety education. Pt was alert and engaged throughout the call. She reports her depression has been minimal in recent weeks; however, she has experienced some anxiety. She has been under some increased stress recently. She notes that her anxiety and ADHD exacerbate each other. We discussed utilizing the following strategies to decrease symptoms: eating regular healthy meals, meditation, regular sleep, regular exercise, self-care/relaxation.     Objective:     Screenings Completed during visit: None completed at this visit    Barriers to care: Psychiatric Diagnosis    Interventions provided: Anxiety Education    Progress towards Anxiety goal : on track    Plan:     1. Case Management to: Follow up in two weeks    2. Patient/caregiver to: implement strategies for mitigating anxiety symptoms    Discuss at next outreach: Anxiety Education     Care Coordination Note updated in [facility]: Yes    Future Appointments   Date Time Provider Department Center   12/13/2020  9:30 AM Romeo Apple, PMHNP [facility]   03/23/2021 10:30 AM Manfred Shirts, MD [facility]       Problem List     ??? Painful menstruation          Allergies:   No Known Allergies    Medications:  Prior to Admission medications    Medication Dose, Route, Frequency   albuterol HFA 90 mcg/actuation inhaler 2 puffs, Inhalation, 2 times a day PRN   azelastine (ASTELIN) 137 mcg (0.1 %) nasal spray 1 spray, Each Nare, 2 times a day (standard), Use in each nostril as directed   dextroamphetamine-amphetamine (ADDERALL) 10 mg tablet 10 mg, Oral, 2 times a day PRN   empty container Misc USE AS DIRECTED   EPINEPHrine (EPIPEN) 0.3 mg/0.3 mL injection 0.3 mg, Intramuscular, Once   famotidine (PEPCID) 20 MG tablet 20 mg, Oral, Daily (standard)   fluoride, sodium, 1.1 % Pste No dose, route, or frequency recorded.   fluticasone propion-salmeteroL (ADVAIR HFA) 230-21 mcg/actuation inhaler 2 puffs, Inhalation, 2 times a day (standard)   fluticasone propionate (FLONASE) 50 mcg/actuation nasal spray 2 sprays, Each Nare, Daily (standard)   fluticasone propionate (FLOVENT HFA) 220 mcg/actuation inhaler 3 puffs, Inhalation, Once as needed   hydrOXYzine (ATARAX) 25 MG tablet 25 mg, Oral, Daily PRN   lisdexamfetamine (VYVANSE) 60 MG cap capsule 60 mg, Oral, Every morning   omalizumab (XOLAIR) 150 mg/mL syringe Inject the contents of 1 syringe (150 mg total) under the skin every fourteen (14) days. Inject along with 75 mg syringe for total dose of 225 mg every 14 days.   omalizumab (XOLAIR) 75 mg/0.5 mL syringe Inject the contents of 1 syringe (75 mg total) under the skin every fourteen (14) days. Inject along with 150 mg syringe for total dose of 225 mg every 14 days.          Aubryanna Nesheim Evangeline Dakin, MSW  11/28/2020

## 2020-12-03 ENCOUNTER — Ambulatory Visit: Admit: 2020-12-03 | Discharge: 2020-12-04 | Payer: PRIVATE HEALTH INSURANCE

## 2020-12-03 DIAGNOSIS — B349 Viral infection, unspecified: Principal | ICD-10-CM

## 2020-12-03 MED ORDER — OSELTAMIVIR 75 MG CAPSULE
ORAL_CAPSULE | Freq: Two times a day (BID) | ORAL | 0 refills | 5.00000 days | Status: CP
Start: 2020-12-03 — End: 2020-12-08

## 2020-12-03 NOTE — Unmapped (Signed)
Take medication as prescribed if flu positive. Tylenol and ibuprofen. Rest. Drink plenty of fluids.      Follow up with your primary care physician this week as needed. Return to Urgent care as needed. Proceed directly to emergency room, for worsening complaints.

## 2020-12-03 NOTE — Unmapped (Signed)
Name:  Jamie Webb  DOB: [birth year]  Date: 12/03/2020    ASSESSMENT/PLAN:  [name] was seen today for headache, cough and fatigue.    Diagnoses and all orders for this visit:    Viral illness  -     RAPID INFLUENZA/RSV/COVID PCR    Other orders  -     oseltamivir (TAMIFLU) 75 MG capsule; Take 1 capsule (75 mg total) by mouth Two (2) times a day for 5 days.      Cassadee Vanzandt is a 30 y.o. female is an overall well-appearing patient.  No acute distress.  Lungs clear throughout.  Symptom onset yesterday.  Daughter sick with similar with influenza cases in daughter's classroom.  Suspect viral illness, concerning for influenza.  Rapid COVID-19, influenza and RSV test ordered.  Tamiflu sent to pharmacy, may begin if test is positive.  Awaiting test results.  Encouraged rest, fluids, over-the-counter Tylenol, ibuprofen, cough congestion medication as needed.  Discussed follow-up and return parameters.Discussed indication, risks and benefits of medications with patient.       Discussed follow up with Primary care physician this week as needed. Discussed follow up and return parameters including no resolution or any worsening concerns. Patient verbalized understanding and agreed to plan.     Pertinent labs & imaging results that were available during my care of the patient were reviewed by me and considered in my medical decision making (see chart for details).     ------------------------------------------------------------------------------    Chief Complaint   Patient presents with   ??? Headache     Headache and bodyaches. Flu is going around daughter's school so she wants both tested. Symptoms started yesterday   ??? Cough     Chronic with asthma   ??? Fatigue       HPI: Jamie Webb is a 30 y.o. female past medical history of allergies, anxiety, asthma, ADD, recurrent sinusitis, depression, presenting with daughter at bedside for evaluation of chills, body aches, cough and congestion since yesterday.    Patient reports the cough congestion are more minimal.  States biggest complaint chills and body aches.  Denies fevers, but has felt like it.  Denies sore throat, vomiting, diarrhea, chest pain or shortness of breath.  Patient states she does also have occasional cough on a chronic basis with her asthma.  States her asthma has been much more well controlled since receiving allergy injections through ENT.  Does not feel that her asthma is flaring at this time.  Denies current pregnancy.  Denies other recent sickness.  Reports her daughter is sick with similar currently as well as states within her daughter's class approximately half of the class currently have influenza a.  Denies other known sick contacts.  Has been taking over-the-counter Tylenol and ibuprofen.  Denies pregnancy.    ROS:  Constitutional: No fever.  Positive chills.  ENT: No sore throat.  As above.  Cardiovascular: Denies chest pain.  Respiratory: Denies shortness of breath.  Gastrointestinal: No abdominal pain.  No nausea, no vomiting.  No diarrhea.   Skin: Negative for rash.    I have reviewed past medical, surgical, medications, allergies, social and family histories today and updated them in Epic where appropriate.    PMH:  Past Medical History:   Diagnosis Date   ??? Allergic rhinitis    ??? Anxiety    ??? Asthma    ??? Attention deficit disorder     Testing done at [facility] clinic in [place]   ??? Chronic sinusitis    ???  Depression    ??? Dysmenorrhea    ??? Financial difficulties    ??? Headache    ??? Hx of chronic bronchitis        SURGICAL HX:  Past Surgical History:   Procedure Laterality Date   ??? CESAREAN SECTION         MEDS:    Current Outpatient Medications:   ???  albuterol HFA 90 mcg/actuation inhaler, Inhale 2 puffs two (2) times a day as needed for wheezing., Disp: 8 g, Rfl: 5  ???  azelastine (ASTELIN) 137 mcg (0.1 %) nasal spray, 1 spray into each nostril Two (2) times a day. Use in each nostril as directed, Disp: 1.2 mL, Rfl: 11  ???  empty container Misc, USE AS DIRECTED, Disp: 1 each, Rfl: 2  ???  EPINEPHrine (EPIPEN) 0.3 mg/0.3 mL injection, Inject 0.3 mL (0.3 mg total) into the muscle once for 1 dose., Disp: 2 each, Rfl: 0  ???  famotidine (PEPCID) 20 MG tablet, Take 1 tablet (20 mg total) by mouth daily., Disp: 30 tablet, Rfl: 1  ???  fluoride, sodium, 1.1 % Pste, , Disp: , Rfl:   ???  fluticasone propion-salmeteroL (ADVAIR HFA) 230-21 mcg/actuation inhaler, Inhale 2 puffs Two (2) times a day., Disp: 12 g, Rfl: 3  ???  fluticasone propionate (FLONASE) 50 mcg/actuation nasal spray, 2 sprays into each nostril daily., Disp: 16 g, Rfl: 0  ???  fluticasone propionate (FLOVENT HFA) 220 mcg/actuation inhaler, Inhale 3 puffs once as needed., Disp: , Rfl:   ???  hydrOXYzine (ATARAX) 25 MG tablet, Take 1 tablet (25 mg total) by mouth daily as needed for anxiety., Disp: 90 tablet, Rfl: 1  ???  lisdexamfetamine (VYVANSE) 60 MG cap capsule, Take 1 capsule (60 mg total) by mouth every morning., Disp: 30 capsule, Rfl: 0  ???  omalizumab (XOLAIR) 150 mg/mL syringe, Inject the contents of 1 syringe (150 mg total) under the skin every fourteen (14) days. Inject along with 75 mg syringe for total dose of 225 mg every 14 days., Disp: 2 mL, Rfl: 12  ???  omalizumab (XOLAIR) 75 mg/0.5 mL syringe, Inject the contents of 1 syringe (75 mg total) under the skin every fourteen (14) days. Inject along with 150 mg syringe for total dose of 225 mg every 14 days., Disp: 1 mL, Rfl: 12  ???  oseltamivir (TAMIFLU) 75 MG capsule, Take 1 capsule (75 mg total) by mouth Two (2) times a day for 5 days., Disp: 10 capsule, Rfl: 0    ALL:  No Known Allergies    SH:  Social History     Tobacco Use   ??? Smoking status: Current Some Day Smoker     Packs/day: 1.00     Types: Cigarettes   ??? Smokeless tobacco: Never Used   ??? Tobacco comment: smokes some infrequently   Vaping Use   ??? Vaping Use: Never used   Substance Use Topics   ??? Alcohol use: No     Alcohol/week: 0.0 standard drinks     Comment: occasional   ??? Drug use: No FH:  Family History   Problem Relation Age of Onset   ??? Anxiety disorder Mother    ??? Depression Mother    ??? ADD / ADHD Mother    ??? Hypertension Father    ??? Heart attack Father    ??? ADD / ADHD Father    ??? Diabetes Father    ??? No Known Problems Sister    ??? No Known  Problems Brother    ??? No Known Problems Maternal Aunt    ??? No Known Problems Maternal Uncle    ??? No Known Problems Paternal Aunt    ??? Diabetes Paternal Uncle    ??? Cancer Maternal Grandmother         breast   ??? No Known Problems Maternal Grandfather    ??? No Known Problems Paternal Grandmother    ??? Diabetes Paternal Grandfather    ??? Cancer Other         maternal great auntx2-breast   ??? Anesthesia problems Neg Hx    ??? Broken bones Neg Hx    ??? Clotting disorder Neg Hx    ??? Collagen disease Neg Hx    ??? Dislocations Neg Hx    ??? Fibromyalgia Neg Hx    ??? Gout Neg Hx    ??? Hemophilia Neg Hx    ??? Osteoporosis Neg Hx    ??? Rheumatologic disease Neg Hx    ??? Scoliosis Neg Hx    ??? Severe sprains Neg Hx    ??? Sickle cell anemia Neg Hx    ??? Spinal Compression Fracture Neg Hx        VITALS:  Vitals:    12/03/20 1522   BP: 112/64   Pulse: 112   Resp: 16   Temp: 36.7 ??C (98.1 ??F)   SpO2: 98%     Body mass index is 28.82 kg/m??.    Physical Exam  Constitutional: Alert and oriented. Well appearing and in no acute distress.  Eyes: Conjunctivae are normal.   ENT       Head: Normocephalic and atraumatic.  No sinus tenderness to palpation.       Ears: Nontender, normal canal, normal TMs bilaterally       Nose: Nasal congestion       Mouth/Throat: Mucous membranes are moist. Oropharynx non-erythematous.  No tonsillar swelling or exudate.  Neck: No stridor. Supple without meningismus.   Hematological/Lymphatic/Immunilogical: No cervical lymphadenopathy.  Cardiovascular: Normal rate, regular rhythm. Grossly normal heart sounds.  Good peripheral circulation.  Respiratory: Normal respiratory effort without tachypnea nor retractions. No wheezes, rales, rhonchi.  Occasional dry cough noted in room.  Speaks in complete sentences.  Musculoskeletal:  Ambulatory with steady gait.   Neurologic:  Normal speech and language.   Skin:  Skin is warm, dry and intact. No rash noted.  Psychiatric: Mood and affect are normal. Speech and behavior are normal. Patient exhibits appropriate insight and judgment    TEST  RESULTS:    No results found for this visit on 12/03/20.    SCREENINGS:  SDOH:   Social History     Tobacco Use   ??? Smoking status: Current Some Day Smoker     Packs/day: 1.00     Types: Cigarettes   ??? Smokeless tobacco: Never Used   ??? Tobacco comment: smokes some infrequently   Vaping Use   ??? Vaping Use: Never used   Substance Use Topics   ??? Alcohol use: No     Alcohol/week: 0.0 standard drinks     Comment: occasional   ??? Drug use: No     I provided an intervention for the Tobacco Use SDOH domain. The intervention was Counseling     DEPRESSION:    PHQ-2 Score: 1    PHQ-9 Score:      Edinburgh Score:      Screening complete, no depression identified / no further action needed today  Renford Dills, NP-C  [facility] [place]  ----------------------------------------------------------------  Note - This record has been created using [employer]. Chart creation errors have been sought, but may not always have been located. Such creation errors do not reflect on the standard of medical care.

## 2020-12-05 MED ORDER — DEXTROAMPHETAMINE-AMPHETAMINE 20 MG TABLET
ORAL_TABLET | Freq: Every day | ORAL | 0 refills | 60 days | Status: CP
Start: 2020-12-05 — End: ?

## 2020-12-05 NOTE — Unmapped (Signed)
[name] on vacation - adderall 10 out of stock - sent in 20 mg for patient-PDMP reviewed  SBV

## 2020-12-05 NOTE — Unmapped (Signed)
COMPLEX CASE MANAGEMENT   Brief Note        HRCC spoke with patient today. Patient confirmed receipt of food and utility resources. Patient advised to reach out with any question or concerns.      Laurene Footman - High Risk Care Coordinator   [facility]  [address], [address] [place], [place] [address]  p. [phone]  [email or web address]

## 2020-12-06 NOTE — Unmapped (Signed)
[facility] Clinical Assessment & Refill Coordination Note    Jamie Webb, DOB: 09/08/1990  Phone: [phone] (home)     All above HIPAA information was verified with patient.     Was a Nurse, learning disability used for this call? No    Specialty Medication(s):   CF/Pulmonary: -Xolair 150 mg/mL  -Xolair 75 mg/0.47mL     Current Outpatient Medications   Medication Sig Dispense Refill   ??? albuterol HFA 90 mcg/actuation inhaler Inhale 2 puffs two (2) times a day as needed for wheezing. 8 g 5   ??? azelastine (ASTELIN) 137 mcg (0.1 %) nasal spray 1 spray into each nostril Two (2) times a day. Use in each nostril as directed 1.2 mL 11   ??? dextroamphetamine-amphetamine (ADDERALL) 20 mg tablet Take 0.5 tablets (10 mg total) by mouth daily. 30 tablet 0   ??? empty container Misc USE AS DIRECTED 1 each 2   ??? EPINEPHrine (EPIPEN) 0.3 mg/0.3 mL injection Inject 0.3 mL (0.3 mg total) into the muscle once for 1 dose. 2 each 0   ??? famotidine (PEPCID) 20 MG tablet Take 1 tablet (20 mg total) by mouth daily. 30 tablet 1   ??? fluoride, sodium, 1.1 % Pste      ??? fluticasone propion-salmeteroL (ADVAIR HFA) 230-21 mcg/actuation inhaler Inhale 2 puffs Two (2) times a day. 12 g 3   ??? fluticasone propionate (FLONASE) 50 mcg/actuation nasal spray 2 sprays into each nostril daily. 16 g 0   ??? fluticasone propionate (FLOVENT HFA) 220 mcg/actuation inhaler Inhale 3 puffs once as needed.     ??? hydrOXYzine (ATARAX) 25 MG tablet Take 1 tablet (25 mg total) by mouth daily as needed for anxiety. 90 tablet 1   ??? omalizumab (XOLAIR) 150 mg/mL syringe Inject the contents of 1 syringe (150 mg total) under the skin every fourteen (14) days. Inject along with 75 mg syringe for total dose of 225 mg every 14 days. 2 mL 12   ??? omalizumab (XOLAIR) 75 mg/0.5 mL syringe Inject the contents of 1 syringe (75 mg total) under the skin every fourteen (14) days. Inject along with 150 mg syringe for total dose of 225 mg every 14 days. 1 mL 12   ??? oseltamivir (TAMIFLU) 75 MG capsule Take 1 capsule (75 mg total) by mouth Two (2) times a day for 5 days. 10 capsule 0     No current facility-administered medications for this visit.        Changes to medications: [name] reports starting the following medications: Started Tamiflu on 10/22    No Known Allergies    Changes to allergies: No    SPECIALTY MEDICATION ADHERENCE     Xolair 150 mg/ml: 0 days of medicine on hand   Xolair 75 mg/0.45mL: 0 days of medicine on hand     Medication Adherence    Patient reported X missed doses in the last month: 0  Specialty Medication: Xolair 150 mg/mL - 1 injection every 14 days (total 225mg  Q14days)  Patient is on additional specialty medications: Yes  Additional Specialty Medications: Xolair 75 mg/0.22mL - 1 injection every 14 days  (total 225mg  Q14days)  Patient Reported Additional Medication X Missed Doses in the Last Month: 0  Patient is on more than two specialty medications: No  Informant: patient          Specialty medication(s) dose(s) confirmed: Regimen is correct and unchanged.     Are there any concerns with adherence? No  Adherence counseling provided? Not needed    CLINICAL MANAGEMENT AND INTERVENTION      Clinical Benefit Assessment:    Do you feel the medicine is effective or helping your condition? Yes    Clinical Benefit counseling provided? Progress note from 8/5 shows evidence of clinical benefit    Adverse Effects Assessment:    Are you experiencing any side effects? No    Are you experiencing difficulty administering your medicine? No    Quality of Life Assessment:    Quality of Life    Rheumatology  Oncology  Dermatology  Cystic Fibrosis          How many days over the past month did your severe asthma  keep you from your normal activities? For example, brushing your teeth or getting up in the morning. 0    Have you discussed this with your provider? Not needed    Acute Infection Status:    Acute infections noted within Epic:  Influenza Human  Patient reported infection: Had the flu since last Saturday - feeling much better and only has a minor cough remaining. She is currently taking Tamiflu    Therapy Appropriateness:    Is therapy appropriate and patient progressing towards therapeutic goals? Yes, therapy is appropriate and should be continued    DISEASE/MEDICATION-SPECIFIC INFORMATION      For patients on injectable medications: Patient currently has 0 doses left.  Next injection is scheduled for 10/31.    PATIENT SPECIFIC NEEDS     - Does the patient have any physical, cognitive, or cultural barriers? No    - Is the patient high risk? No    - Does the patient require a Care Management Plan? No     - Does the patient require physician intervention or other additional services (i.e. nutrition, smoking cessation, social work)? No      SHIPPING     Specialty Medication(s) to be Shipped:   CF/Pulmonary: -Xolair 150 mg/mL  -Xolair 75 mg/0.9mL    Other medication(s) to be shipped: No additional medications requested for fill at this time     Changes to insurance: No    Delivery Scheduled: Yes, Expected medication delivery date: 12/08/20.     Medication will be delivered via Same Day Courier to the confirmed prescription address in [facility].    The patient will receive a drug information handout for each medication shipped and additional FDA Medication Guides as required.  Verified that patient has previously received a Conservation officer, historic buildings and a Surveyor, mining.    The patient or caregiver noted above participated in the development of this care plan and knows that they can request review of or adjustments to the care plan at any time.      All of the patient's questions and concerns have been addressed.    Oliva Bustard   [facility] Specialty Pharmacist

## 2020-12-08 MED FILL — XOLAIR 150 MG/ML SUBCUTANEOUS SYRINGE: SUBCUTANEOUS | 28 days supply | Qty: 2 | Fill #9

## 2020-12-08 MED FILL — XOLAIR 75 MG/0.5 ML SUBCUTANEOUS SYRINGE: SUBCUTANEOUS | 28 days supply | Qty: 1 | Fill #9

## 2020-12-12 NOTE — Unmapped (Signed)
Called patient to reschedule tomorrow's appointment with Jamie Webb as he is out sick. Unable to leave a voice mail (vm box is full.) Sent MyChart message and Well App text with call back number to reschedule.

## 2020-12-16 MED ORDER — LISDEXAMFETAMINE 60 MG CAPSULE
ORAL_CAPSULE | Freq: Every morning | ORAL | 0 refills | 30 days
Start: 2020-12-16 — End: 2021-01-14

## 2020-12-17 MED ORDER — LISDEXAMFETAMINE 60 MG CAPSULE
ORAL_CAPSULE | Freq: Every morning | ORAL | 0 refills | 30.00000 days | Status: CP
Start: 2020-12-17 — End: 2021-01-15

## 2020-12-20 ENCOUNTER — Telehealth
Admit: 2020-12-20 | Discharge: 2020-12-21 | Payer: PRIVATE HEALTH INSURANCE | Attending: Psychiatric/Mental Health | Primary: Psychiatric/Mental Health

## 2020-12-20 DIAGNOSIS — F3340 Major depressive disorder, recurrent, in remission, unspecified: Principal | ICD-10-CM

## 2020-12-20 DIAGNOSIS — F902 Attention-deficit hyperactivity disorder, combined type: Principal | ICD-10-CM

## 2020-12-20 MED ORDER — HYDROXYZINE HCL 25 MG TABLET
ORAL_TABLET | Freq: Every day | ORAL | 1 refills | 90 days | Status: CP | PRN
Start: 2020-12-20 — End: 2021-06-18

## 2020-12-20 MED ORDER — LISDEXAMFETAMINE 60 MG CAPSULE
ORAL_CAPSULE | Freq: Every morning | ORAL | 0 refills | 30 days | Status: CP
Start: 2020-12-20 — End: 2021-01-18

## 2020-12-20 MED ORDER — DEXTROAMPHETAMINE-AMPHETAMINE 20 MG TABLET
ORAL_TABLET | Freq: Two times a day (BID) | ORAL | 0 refills | 30 days | Status: CP | PRN
Start: 2020-12-20 — End: ?

## 2020-12-20 NOTE — Unmapped (Signed)
[facility]  Psychiatry   Established Patient E&M Service - Outpatient       Assessment:    Tiffini Blacksher presents for follow-up evaluation.   Last seen 09/05/20    Epic notes reviewed-  Has complex Case Management.   Asked her explain what this means to here- reassuring my medical problems are being managed.      Sinus surgery keeps getting rescheduled.  Now wanting this delayed to Jan, doesn't want recovery during the holidays.  Family dealing with Flu.      Identifying Information:  Berneice Zettlemoyer is a 30 y.o. female with a history of   Patient Active Problem List   Diagnosis   ??? Attention deficit hyperactivity disorder (ADHD), combined type, mild, in partial remission   ??? Dysmenorrhea   ??? Chronic sinusitis   ??? Routine general medical examination at a health care facility   ??? Depression, major, recurrent, in remission (CMS-HCC)       Risk Assessment:  An assessment of suicide and violence risk factors was performed as part of this evaluation and is not significantly changed from the last visit.              While future psychiatric events cannot be accurately predicted, the patient does not currently require acute inpatient psychiatric care and does not currently meet [facility] involuntary commitment criteria       Plan:    Problem 1:??ADHD  Status of problem:??improved or improving  Interventions:??  Vyvanse??60mg -????????Coaching about not chasing energy here, we are looking at function. ??Will continue to monitor.  Keeping regular Adderall 20mg  and she takes as 10mg ??BID??for now.????  ??  Problem 2:??Mood- depression/anxiety  Status of problem:??chronic and stable, mostly resolved as we worked on treating the ADHD her functionality improved and less chaos in her life and mood improved. ??Now off antidpressants. ??She says they were only good for side effects. Bittany feels most of her mood issues are better explained as hormonal > psychiatry.    Interventions:??  ??  Continue with hydroxyzine- prn usage a couple of times a week.   ??  Now off gabapentin.  ??  Suggested therapy. ??Daughter now going although didn't work out with one, trying a new one this Friday.     ??            Psychotherapy provided:  No billable psychotherapy service provided but brief supportive therapy was utilized.    Patient has been given this writer's contact information as well as the [facility] Psychiatry urgent line number. The patient has been instructed to call 911 for emergencies.          Subjective:    Chief complaint:  Follow-up psychiatric evaluation for ADHD and depression/anxiety.    Interval History:   Family has been struggling with flu and other medical issues.  [name] no longer with the full time babysitting of her nephew, they weren't paying and Asianae's husband got a new job that pays a little more and this eased $$$ so [name] doesn't have to babysit.                Objective:  Metabolic Check  Wt Readings from Last 6 Encounters:   12/03/20 83.5 kg (184 lb)   09/16/20 83.5 kg (184 lb)   08/10/20 83.6 kg (184 lb 3.2 oz)   05/18/20 88.9 kg (196 lb)   03/28/20 88.9 kg (196 lb)   02/10/20 95.3 kg (210 lb)  Mental Status Exam:    Mental Status Exam:  Appearance:     Appears stated age, Well nourished, Well developed and Clean/Neat   Motor:    No abnormal movements   Speech/Language:     Normal rate, volume, tone, fluency   Mood:    pretty good   Affect:    Calm and Cooperative   Thought process and Associations:    Logical, linear, clear, coherent, goal directed   Abnormal/psychotic thought content:      Denies SI, HI, self harm, delusions, obsessions, paranoid ideation, or ideas of reference   Perceptual disturbances:      Denies auditory and visual hallucinations, behavior not concerning for response to internal stimuli                I personally spent 32 minutes face-to-face and non-face-to-face in the care of this patient, which includes all pre, intra, and post visit time on the date of service.    I spent 21 minutes on the real-time audio and video with the patient on the date of service. I spent an additional 11 minutes on pre- and post-visit activities.     The patient was physically located in [place] or a state in which I am permitted to provide care. The patient and/or parent/guardian understood that s/he may incur co-pays and cost sharing, and agreed to the telemedicine visit. The visit was reasonable and appropriate under the circumstances given the patient's presentation at the time.    The patient and/or parent/guardian has been advised of the potential risks and limitations of this mode of treatment (including, but not limited to, the absence of in-person examination) and has agreed to be treated using telemedicine. The patient's/patient's family's questions regarding telemedicine have been answered.     If the visit was completed in an ambulatory setting, the patient and/or parent/guardian has also been advised to contact their provider???s office for worsening conditions, and seek emergency medical treatment and/or call 911 if the patient deems either necessary.    Patient educated on every visit on the role of the Psychiatric Nurse Practitioner.  It was explained provider is not a Visual merchandiser.  They are aware this provider does psychiatric medications only and that they are responsible for seeing their primary care physician for medical needs and that there might be medical conditions that cause psychiatric symptoms (like thyroid issues) that will require collaborative work with this provider, their PCP, and the patient.        [name], PMHNP  12/20/2020

## 2020-12-30 NOTE — Unmapped (Signed)
[facility] Refill Coordination Note    Specialty Medication(s) to be Shipped:   CF/Pulmonary: -Xolair 75mg /0.69ml Syrg & Xolair 150mg /68ml Syrg  Other medication(s) to be shipped: No additional medications requested for fill at this time     Jamie Webb, DOB: 11/19/1990  Phone: [phone] (home)     All above HIPAA information was verified with patient.     Was a Nurse, learning disability used for this call? No    Completed refill call assessment today to schedule patient's medication shipment from the [facility] [phone]).  All relevant notes have been reviewed.     Specialty medication(s) and dose(s) confirmed: Regimen is correct and unchanged.   Changes to medications: [name] reports no changes at this time.  Changes to insurance: No  New side effects reported not previously addressed with a pharmacist or physician: None reported  Questions for the pharmacist: No    Confirmed patient received a Conservation officer, historic buildings and a Surveyor, mining with first shipment. The patient will receive a drug information handout for each medication shipped and additional FDA Medication Guides as required.       DISEASE/MEDICATION-SPECIFIC INFORMATION        For CF patients: CF Healthwell [place] Active? No-not enrolled    SPECIALTY MEDICATION ADHERENCE     Medication Adherence    Patient reported X missed doses in the last month: 0  Specialty Medication: Xolair 75mg /0.22ml Syrg  Patient is on additional specialty medications: Yes  Additional Specialty Medications: Xolair 150mg /ml Syrg  Patient Reported Additional Medication X Missed Doses in the Last Month: 0  Patient is on more than two specialty medications: No  Informant: patient  Reliability of informant: reliable  Reasons for non-adherence: no problems identified  Confirmed plan for next specialty medication refill: delivery by pharmacy  Refills needed for supportive medications: not needed        Were doses missed due to medication being on hold? No    Xolair 75mg /0.24ml Syrg: 0 days of medicine on hand (Next Injection: 01/09/21)  Xolair 150mg /56ml Syrg: 0 days of medicine on hand (Next Injection: 01/09/21)    REFERRAL TO PHARMACIST     Referral to the pharmacist: Not needed    [facility]     Shipping address confirmed in Epic.     Delivery Scheduled: Yes, Expected medication delivery date: 01/03/2021.     Medication will be delivered via Same Day Courier to the prescription address in Epic WAM.    Channon Ambrosini P Allena Katz   [facility] Specialty Technician

## 2021-01-03 MED FILL — XOLAIR 75 MG/0.5 ML SUBCUTANEOUS SYRINGE: SUBCUTANEOUS | 28 days supply | Qty: 1 | Fill #10

## 2021-01-03 MED FILL — XOLAIR 150 MG/ML SUBCUTANEOUS SYRINGE: SUBCUTANEOUS | 28 days supply | Qty: 2 | Fill #10

## 2021-01-03 NOTE — Unmapped (Signed)
Complex Case Management  SUMMARY NOTE    Attempted to contact pt today at Cell number to resolve Complex Case Management services. No answer, unable to leave message; 1st attempt    Discuss at next visit: Graduation from Complex Case Management

## 2021-01-04 NOTE — Unmapped (Signed)
Complex Case Management  SUMMARY NOTE    Attempted to contact pt today at Cell number to resolve Complex Case Management services. No answer, unable to leave message; 2nd attempt, letter sent.    This patient has been withdrawn from Complex Case Management services on 01/04/2021. Patient was unable to be contacted after enrollment. To have this patient reevaluated for Complex Case Management please send an AMB Referral to Case Management to the Personal Health Advocate Department and obtain best contact information.

## 2021-01-17 MED ORDER — LISDEXAMFETAMINE 60 MG CAPSULE
ORAL_CAPSULE | Freq: Every morning | ORAL | 0 refills | 30 days | Status: CP
Start: 2021-01-17 — End: 2021-02-15

## 2021-01-17 MED ORDER — DEXTROAMPHETAMINE-AMPHETAMINE 20 MG TABLET
ORAL_TABLET | Freq: Two times a day (BID) | ORAL | 0 refills | 30 days | Status: CP | PRN
Start: 2021-01-17 — End: 2021-02-16

## 2021-01-23 NOTE — Unmapped (Signed)
[facility] Refill Coordination Note    Specialty Medication(s) to be Shipped:   CF/Pulmonary: -XOLAIR 150 mg/mL syringe (omalizumab)  -XOLAIR 75 mg/0.5 mL syringe (omalizumab)    Other medication(s) to be shipped: No additional medications requested for fill at this time     Jamie Webb, DOB: 02-04-1991  Phone: [phone] (home)       All above HIPAA information was verified with patient.     Was a Nurse, learning disability used for this call? No    Completed refill call assessment today to schedule patient's medication shipment from the [facility] [phone]).  All relevant notes have been reviewed.     Specialty medication(s) and dose(s) confirmed: Regimen is correct and unchanged.   Changes to medications: [name] reports no changes at this time.  Changes to insurance: No  New side effects reported not previously addressed with a pharmacist or physician: None reported  Questions for the pharmacist: No    Confirmed patient received a Conservation officer, historic buildings and a Surveyor, mining with first shipment. The patient will receive a drug information handout for each medication shipped and additional FDA Medication Guides as required.       DISEASE/MEDICATION-SPECIFIC INFORMATION        For patients on injectable medications: Patient currently has 0 doses left.  Next injection is scheduled for 02/06/21.    SPECIALTY MEDICATION ADHERENCE     Medication Adherence    Patient reported X missed doses in the last month: 0  Specialty Medication: Xolair 75mg /0.48ml Syrg  Patient is on additional specialty medications: Yes  Additional Specialty Medications: Xolair 150mg /26ml Syrg  Patient Reported Additional Medication X Missed Doses in the Last Month: 0  Patient is on more than two specialty medications: No  Any gaps in refill history greater than 2 weeks in the last 3 months: no  Demonstrates understanding of importance of adherence: yes  Informant: patient  Reliability of informant: reliable  Reasons for non-adherence: no problems identified  Confirmed plan for next specialty medication refill: delivery by pharmacy  Refills needed for supportive medications: not needed              Were doses missed due to medication being on hold? No    XOLAIR 75 mg/0.5 mL : 0 days of medicine on hand   XOLAIR 150 mg/mL : 0 days of medicine on hand       REFERRAL TO PHARMACIST     Referral to the pharmacist: Not needed      [facility]     Shipping address confirmed in Epic.     Delivery Scheduled: Yes, Expected medication delivery date: 02/02/21.     Medication will be delivered via Same Day Courier to the prescription address in Epic WAM.    Jamie Webb   [facility] Specialty Technician

## 2021-01-28 MED ORDER — DEXTROAMPHETAMINE-AMPHETAMINE 20 MG TABLET
ORAL_TABLET | Freq: Two times a day (BID) | ORAL | 0 refills | 30 days | PRN
Start: 2021-01-28 — End: ?

## 2021-01-30 MED ORDER — DEXTROAMPHETAMINE-AMPHETAMINE 20 MG TABLET
ORAL_TABLET | Freq: Two times a day (BID) | ORAL | 0 refills | 30 days | Status: CP | PRN
Start: 2021-01-30 — End: 2021-03-01

## 2021-02-02 MED FILL — XOLAIR 75 MG/0.5 ML SUBCUTANEOUS SYRINGE: SUBCUTANEOUS | 28 days supply | Qty: 1 | Fill #11

## 2021-02-02 MED FILL — XOLAIR 150 MG/ML SUBCUTANEOUS SYRINGE: SUBCUTANEOUS | 28 days supply | Qty: 2 | Fill #11

## 2021-02-15 MED ORDER — DEXTROAMPHETAMINE-AMPHETAMINE 20 MG TABLET
ORAL_TABLET | Freq: Two times a day (BID) | ORAL | 0 refills | 30.00000 days | Status: CP | PRN
Start: 2021-02-15 — End: 2021-03-17

## 2021-02-15 MED ORDER — LISDEXAMFETAMINE 60 MG CAPSULE
ORAL_CAPSULE | Freq: Every morning | ORAL | 0 refills | 30 days | Status: CP
Start: 2021-02-15 — End: 2021-03-16

## 2021-02-28 NOTE — Unmapped (Signed)
[facility] Refill Coordination Note    Specialty Medication(s) to be Shipped:   CF/Pulmonary: -Xolair 75mg /0.65ml Syrg & Xolair 150mg /32ml Syrg  Other medication(s) to be shipped: No additional medications requested for fill at this time     Jamie Webb, DOB: [birth year]  Phone: [phone] (home)     All above HIPAA information was verified with patient.     Was a Nurse, learning disability used for this call? No    Completed refill call assessment today to schedule patient's medication shipment from the [facility] [phone]).  All relevant notes have been reviewed.     Specialty medication(s) and dose(s) confirmed: Regimen is correct and unchanged.   Changes to medications: [name] reports no changes at this time.  Changes to insurance: No  New side effects reported not previously addressed with a pharmacist or physician: None reported  Questions for the pharmacist: No    Confirmed patient received a Conservation officer, historic buildings and a Surveyor, mining with first shipment. The patient will receive a drug information handout for each medication shipped and additional FDA Medication Guides as required.       DISEASE/MEDICATION-SPECIFIC INFORMATION        For CF patients: CF Healthwell [place] Active? No-not enrolled    SPECIALTY MEDICATION ADHERENCE     Medication Adherence    Patient reported X missed doses in the last month: 0  Specialty Medication: Xolair 75mg /0.37ml Syrg  Patient is on additional specialty medications: Yes  Additional Specialty Medications: Xolair 150mg /69ml Syrg  Patient Reported Additional Medication X Missed Doses in the Last Month: 0  Patient is on more than two specialty medications: No  Informant: patient  Reliability of informant: reliable  Reasons for non-adherence: no problems identified  Confirmed plan for next specialty medication refill: delivery by pharmacy  Refills needed for supportive medications: not needed        Were doses missed due to medication being on hold? No    Xolair 75mg /0.32ml Syrg: 0 days of medicine on hand (Next Injection: 03/06/21)  Xolair 150mg /152ml Syrg: 0 days of medicine on hand (Next Injection: 03/06/21)    REFERRAL TO PHARMACIST     Referral to the pharmacist: Not needed    [facility]     Shipping address confirmed in Epic.     Delivery Scheduled: Yes, Expected medication delivery date: 03/03/2021.     Medication will be delivered via Same Day Courier to the prescription address in Epic WAM.    Jamie Webb   [facility] Specialty Technician

## 2021-03-03 MED FILL — XOLAIR 150 MG/ML SUBCUTANEOUS SYRINGE: SUBCUTANEOUS | 28 days supply | Qty: 2 | Fill #12

## 2021-03-03 MED FILL — XOLAIR 75 MG/0.5 ML SUBCUTANEOUS SYRINGE: SUBCUTANEOUS | 28 days supply | Qty: 1 | Fill #12

## 2021-03-15 MED ORDER — LISDEXAMFETAMINE 60 MG CAPSULE
ORAL_CAPSULE | Freq: Every morning | ORAL | 0 refills | 30.00000 days
Start: 2021-03-15 — End: 2021-04-13

## 2021-03-15 MED ORDER — DEXTROAMPHETAMINE-AMPHETAMINE 20 MG TABLET
ORAL_TABLET | Freq: Two times a day (BID) | ORAL | 0 refills | 30 days | PRN
Start: 2021-03-15 — End: 2021-04-14

## 2021-03-16 MED ORDER — DEXTROAMPHETAMINE-AMPHETAMINE 20 MG TABLET
ORAL_TABLET | Freq: Two times a day (BID) | ORAL | 0 refills | 30 days | Status: CP | PRN
Start: 2021-03-16 — End: 2021-04-15

## 2021-03-16 MED ORDER — LISDEXAMFETAMINE 60 MG CAPSULE
ORAL_CAPSULE | Freq: Every morning | ORAL | 0 refills | 30 days | Status: CP
Start: 2021-03-16 — End: 2021-04-14

## 2021-03-22 ENCOUNTER — Telehealth
Admit: 2021-03-22 | Discharge: 2021-03-23 | Payer: PRIVATE HEALTH INSURANCE | Attending: Psychiatric/Mental Health | Primary: Psychiatric/Mental Health

## 2021-03-22 DIAGNOSIS — F902 Attention-deficit hyperactivity disorder, combined type: Principal | ICD-10-CM

## 2021-03-22 DIAGNOSIS — F3340 Major depressive disorder, recurrent, in remission, unspecified: Principal | ICD-10-CM

## 2021-03-22 MED ORDER — DEXTROAMPHETAMINE-AMPHETAMINE 20 MG TABLET
ORAL_TABLET | Freq: Two times a day (BID) | ORAL | 0 refills | 30 days | Status: CP | PRN
Start: 2021-03-22 — End: 2021-04-21

## 2021-03-22 MED ORDER — LISDEXAMFETAMINE 60 MG CAPSULE
ORAL_CAPSULE | Freq: Every morning | ORAL | 0 refills | 30 days | Status: CP
Start: 2021-03-22 — End: 2021-04-20

## 2021-03-22 NOTE — Unmapped (Signed)
[facility]  Psychiatry   Established Patient E&M Service - Outpatient       Assessment:    Jamie Webb presents for follow-up evaluation.     Last seen: 12/20/20    Epic notes reviewed- pt did not respond to complex case management and services withdrawn 11/23  Sinus surgery on hold- symptoms better recently and is worried she doesn't have the time to recover.    Identifying Information:  Jamie Webb is a 31 y.o. female with a history of   Patient Active Problem List   Diagnosis   ??? Attention deficit hyperactivity disorder (ADHD), combined type, mild, in partial remission   ??? Dysmenorrhea   ??? Chronic sinusitis   ??? Routine general medical examination at a health care facility   ??? Depression, major, recurrent, in remission (CMS-HCC)       Risk Assessment:  An assessment of suicide and violence risk factors was performed as part of this evaluation and is not significantly changed from the last visit.              While future psychiatric events cannot be accurately predicted, the patient does not currently require acute inpatient psychiatric care and does not currently meet [facility] involuntary commitment criteria       Plan:    Problem 1:??ADHD  Status of problem:??improved or improving  Interventions:??  Vyvanse??60mg -????????Coaching about not chasing energy here, we are looking at function. ??Will continue to monitor.  Keeping regular Adderall 20mg  and she takes as 10mg ??BID??for now.????  ??  Problem 2:??Mood- depression/anxiety  Status of problem:??chronic and stable, mostly resolved as we worked on treating the ADHD her functionality improved and less chaos in her life and mood improved. ??Now off antidpressants. ??She says they were only good for side effects. Bittany feels most of her mood issues are better explained as hormonal > psychiatry.  ??  Interventions:??  ??  Continue with hydroxyzine- prn usage a couple of times a week.   ??  Now off gabapentin.  ??  Suggested therapy. ??Daughter was going although didn't work out with one, trying to find a new one.  [name] could use her own.   ??        Opportunities to optimize healthy behaviors to include nutrition, exercise, and restorative sleep have been discussed. Patient / caregiver voiced understanding    Psychotherapy provided:  No billable psychotherapy service provided but brief supportive therapy was utilized.    Patient has been given this writer's contact information as well as the [facility] Psychiatry urgent line number. The patient has been instructed to call 911 for emergencies.          Subjective:    Chief complaint:  Follow-up psychiatric evaluation for ADHD and depression/anxiety.    Interval History:   Family is good  Life a little better with easing off the babysitting.            Objective:  Metabolic Check  Wt Readings from Last 6 Encounters:   12/03/20 83.5 kg (184 lb)   09/16/20 83.5 kg (184 lb)   08/10/20 83.6 kg (184 lb 3.2 oz)   05/18/20 88.9 kg (196 lb)   03/28/20 88.9 kg (196 lb)   02/10/20 95.3 kg (210 lb)       Mental Status Exam:    Mental Status Exam:  Appearance:     Appears stated age, Well nourished, Well developed and Clean/Neat   Motor:    No abnormal movements  Speech/Language:     Normal rate, volume, tone, fluency   Mood:    good   Affect:    Calm and Cooperative   Thought process and Associations:    Logical, linear, clear, coherent, goal directed   Abnormal/psychotic thought content:      Denies SI, HI, self harm, delusions, obsessions, paranoid ideation, or ideas of reference   Perceptual disturbances:      Denies auditory and visual hallucinations, behavior not concerning for response to internal stimuli                I personally spent 30 minutes face-to-face and non-face-to-face in the care of this patient, which includes all pre, intra, and post visit time on the date of service.    I spent 20 minutes on the real-time audio and video with the patient on the date of service. I spent an additional 10 minutes on pre- and post-visit activities.     The patient was physically located in [place] or a state in which I am permitted to provide care. The patient and/or parent/guardian understood that s/he may incur co-pays and cost sharing, and agreed to the telemedicine visit. The visit was reasonable and appropriate under the circumstances given the patient's presentation at the time.    The patient and/or parent/guardian has been advised of the potential risks and limitations of this mode of treatment (including, but not limited to, the absence of in-person examination) and has agreed to be treated using telemedicine. The patient's/patient's family's questions regarding telemedicine have been answered.     If the visit was completed in an ambulatory setting, the patient and/or parent/guardian has also been advised to contact their provider???s office for worsening conditions, and seek emergency medical treatment and/or call 911 if the patient deems either necessary.    Patient educated on every visit on the role of the Psychiatric Nurse Practitioner.  It was explained provider is not a Visual merchandiser.  They are aware this provider does psychiatric medications only and that they are responsible for seeing their primary care physician for medical needs and that there might be medical conditions that cause psychiatric symptoms (like thyroid issues) that will require collaborative work with this provider, their PCP, and the patient.        [name], PMHNP  03/22/2021

## 2021-03-25 NOTE — Unmapped (Signed)
There is a national stimulant medication shortage.  We understand it is frustrating and challenging when the pharmacy says they don't have your medication and we wanted to share information on how to best manage this situation.    First its a good idea to understand what is going on.       This is a good article explaining -   [email or web address]    Here are our tips and tricks for best managing this situation.   Please understand our clinic prescribes medications- but doesn't have any control over distribution.   W will do our best to be responsive and work with you, but it is a difficult situation that seems to be getting worse.  Alternative pharmacies and medications options we were trying to help with have disappeared.    'Calling around' no longer works, the pharmacies are overwhelmed with calls and for safety reasons are saying 'no' to all callers.   We are hearing stories of I called 20 pharmacies and no one has any. What you can do-       You might have to wait in your pharmacies que to get your medications.  This might be a couple of days or weeks.  Unfortunately, this is the new reality. Let the pharmacy know your willing to wait, not cancelling.    If the pharmacy says your not going to get any, Ask the pharmacist (best if in person) if they can look in their system to see if any local stores in their chain has the medication.  If so, send your provider a message through [place] with the new pharmacy information.  The pharmacies can't transfer these prescriptions, a new one must be sent- even if the same chain.      You can ask the pharmacist if they have a dose of your medication that is close- like if you take Adderall XR 20's but they have 15mg 's, that might be a good option.  A little higher or a little lower dose might be better then nothing.  Again- send your provider a message through [place] with the exact request to see if appropriate.  The pharmacist can't alter prescriptions.    If there isn't anything close, you can ask them if they see any creative options.  Sometimes the pharmacist can see an easy fix- like a patient taking Adderall 10mg  twice daily that are out of stock switching to in stock 20mg  tabs they take half of twice daily.   Again- send your provider a message through [place] with the exact request.  The pharmacist can't alter prescriptions.       Most pharmacies have the brand name in stock.  This is frustratingly not an option due to your insurance- you can always fill your prescription as name brand- but your insurance isn't going to pay for them.  This can be expensive ($240 a month for Adderall XR's).       Please understand it is impossible for [place] to keep up with who has the medications in stock as this seems to change on an hour by hour basis.  We will send your prescription where you request.      If you want to switch pharmacies, it's a good idea to stop by in person first and speak with them, blindly choosing a new pharmacy and hoping they have some no longer works.      A complete change in your medication to something else isn't a good option anymore, the  alternative medications are now gone as well and switches require visits with your provider to see if appropriate and most stimulants require a new prior authorization which takes several days to process.     For legal reasons your prescription can't be sent to multiple locations.  For safety reason, never get from an unauthorized source.        We know this is a lot to deal with and we hope the situation improves for the mental health of [place] all.   In the meantime, following these steps is the best you can do now.

## 2021-03-27 NOTE — Unmapped (Signed)
[facility] Refill Coordination Note    Specialty Medication(s) to be Shipped:   CF/Pulmonary: -Xolair 75mg /0.30ml Syrg & Xolair 150mg /56ml Syrg  Other medication(s) to be shipped: No additional medications requested for fill at this time     Jamie Webb, DOB: October 22, 1990  Phone: [phone] (home)     All above HIPAA information was verified with patient.     Was a Nurse, learning disability used for this call? No    Completed refill call assessment today to schedule patient's medication shipment from the [facility] [phone]).  All relevant notes have been reviewed.     Specialty medication(s) and dose(s) confirmed: Regimen is correct and unchanged.   Changes to medications: Jamie Webb reports no changes at this time.  Changes to insurance: No  New side effects reported not previously addressed with a pharmacist or physician: None reported  Questions for the pharmacist: No    Confirmed patient received a Conservation officer, historic buildings and a Surveyor, mining with first shipment. The patient will receive a drug information handout for each medication shipped and additional FDA Medication Guides as required.       DISEASE/MEDICATION-SPECIFIC INFORMATION        For CF patients: CF Healthwell [place] Active? No-not enrolled    SPECIALTY MEDICATION ADHERENCE     Medication Adherence    Patient reported X missed doses in the last month: 0  Specialty Medication: Xolair 75mg /0.24ml Syrg  Patient is on additional specialty medications: Yes  Additional Specialty Medications: Xolair 150mg /40ml Syrg  Patient Reported Additional Medication X Missed Doses in the Last Month: 0  Patient is on more than two specialty medications: No  Informant: patient  Reliability of informant: reliable  Reasons for non-adherence: no problems identified  Confirmed plan for next specialty medication refill: delivery by pharmacy  Refills needed for supportive medications: not needed        Were doses missed due to medication being on hold? No    Xolair 75mg /0.52ml Syrg: 0 days of medicine on hand (Next Injection:  04/03/2021)  Xolair 150mg /61ml Syrg: 0 days of medicine on hand (Next Injection:  04/03/2021)    REFERRAL TO PHARMACIST     Referral to the pharmacist: Not needed    [facility]     Shipping address confirmed in Epic.     Delivery Scheduled: Yes, Expected medication delivery date: 03/30/2021.     Medication will be delivered via Same Day Courier to the prescription address in Epic WAM.    Jamie Webb   [facility] Shared [facility] Specialty Technician

## 2021-03-30 MED FILL — XOLAIR 150 MG/ML SUBCUTANEOUS SYRINGE: SUBCUTANEOUS | 28 days supply | Qty: 2 | Fill #13

## 2021-03-30 MED FILL — XOLAIR 75 MG/0.5 ML SUBCUTANEOUS SYRINGE: SUBCUTANEOUS | 28 days supply | Qty: 1 | Fill #13

## 2021-04-12 MED ORDER — DEXTROAMPHETAMINE-AMPHETAMINE 20 MG TABLET
ORAL_TABLET | Freq: Two times a day (BID) | ORAL | 0 refills | 30 days | PRN
Start: 2021-04-12 — End: 2021-05-12

## 2021-04-13 MED ORDER — DEXTROAMPHETAMINE-AMPHETAMINE 20 MG TABLET
ORAL_TABLET | Freq: Two times a day (BID) | ORAL | 0 refills | 30 days | Status: CP | PRN
Start: 2021-04-13 — End: 2021-05-13

## 2021-04-14 DIAGNOSIS — J4551 Severe persistent asthma with (acute) exacerbation: Principal | ICD-10-CM

## 2021-04-14 MED ORDER — XOLAIR 75 MG/0.5 ML SUBCUTANEOUS SYRINGE
SUBCUTANEOUS | 12 refills | 28 days
Start: 2021-04-14 — End: 2021-04-29

## 2021-04-14 MED ORDER — XOLAIR 150 MG/ML SUBCUTANEOUS SYRINGE
SUBCUTANEOUS | 12 refills | 28 days
Start: 2021-04-14 — End: 2021-04-29

## 2021-04-15 MED ORDER — XOLAIR 150 MG/ML SUBCUTANEOUS SYRINGE
SUBCUTANEOUS | 12 refills | 28 days | Status: CP
Start: 2021-04-15 — End: 2021-04-30
  Filled 2021-04-27: qty 2, 28d supply, fill #0

## 2021-04-15 MED ORDER — XOLAIR 75 MG/0.5 ML SUBCUTANEOUS SYRINGE
SUBCUTANEOUS | 12 refills | 28 days | Status: CP
Start: 2021-04-15 — End: 2021-04-30

## 2021-04-17 MED ORDER — DEXTROAMPHETAMINE-AMPHETAMINE 20 MG TABLET
ORAL_TABLET | Freq: Two times a day (BID) | ORAL | 0 refills | 30.00000 days | Status: CP | PRN
Start: 2021-04-17 — End: 2021-05-17

## 2021-04-18 DIAGNOSIS — J4551 Severe persistent asthma with (acute) exacerbation: Principal | ICD-10-CM

## 2021-04-21 MED ORDER — LISDEXAMFETAMINE 60 MG CAPSULE
ORAL_CAPSULE | Freq: Every morning | ORAL | 0 refills | 30 days | Status: CP
Start: 2021-04-21 — End: 2021-05-20

## 2021-04-21 NOTE — Unmapped (Signed)
[facility] Pharm[facility] Refill Coordination Note    Specialty Medication(s) to be Shipped:   CF/Pulmonary: -Xolair 150mg /47ml Syrg & Xolair 75mg /0.60ml Syrg  Other medication(s) to be shipped: No additional medications requested for fill at this time     Jamie Webb, DOB: 1990-12-10  Phone: [phone] (home)     All above HIPAA information was verified with patient.     Was a Nurse, learning disability used for this call? No    Completed refill call assessment today to schedule patient's medication shipment from the [facility] [phone]).  All relevant notes have been reviewed.     Specialty medication(s) and dose(s) confirmed: Regimen is correct and unchanged.   Changes to medications: [name] reports no changes at this time.  Changes to insurance: No  New side effects reported not previously addressed with a pharmacist or physician: None reported  Questions for the pharmacist: No    Confirmed patient received a Conservation officer, historic buildings and a Surveyor, mining with first shipment. The patient will receive a drug information handout for each medication shipped and additional FDA Medication Guides as required.       DISEASE/MEDICATION-SPECIFIC INFORMATION        For CF patients: CF Healthwell [place] Active? No-not enrolled    SPECIALTY MEDICATION ADHERENCE     Medication Adherence    Patient reported X missed doses in the last month: 0  Specialty Medication: Xolair 150mg /11ml Syrg  Patient is on additional specialty medications: Yes  Additional Specialty Medications: Xolair 75mg /0.5ml Syrg  Patient Reported Additional Medication X Missed Doses in the Last Month: 0  Patient is on more than two specialty medications: No  Informant: patient  Reliability of informant: reliable  Reasons for non-adherence: no problems identified  Confirmed plan for next specialty medication refill: delivery by pharmacy  Refills needed for supportive medications: not needed        Were doses missed due to medication being on hold? No    Xolair 150mg /52ml Syrg: 0 days of medicine on hand (Next Injection: 05/01/2021)  Xolair 75mg /0.83ml Syrg: 0 days of medicine on hand (Next Injection: 05/01/2021)    REFERRAL TO PHARMACIST     Referral to the pharmacist: Not needed    [facility]     Shipping address confirmed in Epic.     Delivery Scheduled: Yes, Expected medication delivery date: 04/27/2021.     Medication will be delivered via Same Day Courier to the prescription address in Epic WAM.    Jamie Webb P Allena Katz   [facility] Shared [facility] Specialty Technician

## 2021-04-27 MED FILL — XOLAIR 75 MG/0.5 ML SUBCUTANEOUS SYRINGE: SUBCUTANEOUS | 28 days supply | Qty: 1 | Fill #0

## 2021-05-11 MED ORDER — DEXTROAMPHETAMINE-AMPHETAMINE 20 MG TABLET
ORAL_TABLET | Freq: Two times a day (BID) | ORAL | 0 refills | 30 days | PRN
Start: 2021-05-11 — End: 2021-06-10

## 2021-05-11 MED ORDER — FLUTICASONE PROPIONATE 220 MCG/ACTUATION HFA AEROSOL INHALER
Freq: Once | RESPIRATORY_TRACT | 1 refills | 0.00000 days | PRN
Start: 2021-05-11 — End: ?

## 2021-05-11 NOTE — Unmapped (Signed)
Patient is requesting the following refill  Requested Prescriptions     Pending Prescriptions Disp Refills    fluticasone propionate (FLOVENT HFA) 220 mcg/actuation inhaler 12 g 1     Sig: Inhale 3 puffs once as needed.       Recent Visits  No visits were found meeting these conditions.  Showing recent visits within past 365 days with a meds authorizing provider and meeting all other requirements  Future Appointments  No visits were found meeting these conditions.  Showing future appointments within next 365 days with a meds authorizing provider and meeting all other requirements       Labs: Not applicable this refill    NEEDS APPOINTMENT    Waunita Schooner, RN

## 2021-05-11 NOTE — Unmapped (Signed)
Needs appointment

## 2021-05-12 MED ORDER — DEXTROAMPHETAMINE-AMPHETAMINE 20 MG TABLET
ORAL_TABLET | Freq: Two times a day (BID) | ORAL | 0 refills | 30 days | Status: CP | PRN
Start: 2021-05-12 — End: 2021-06-11

## 2021-05-15 MED ORDER — DEXTROAMPHETAMINE-AMPHETAMINE 20 MG TABLET
ORAL_TABLET | Freq: Two times a day (BID) | ORAL | 0 refills | 30 days | Status: CP | PRN
Start: 2021-05-15 — End: 2021-06-14

## 2021-05-17 NOTE — Unmapped (Signed)
[facility] Pharmacy Refill Coordination Note    Specialty Medication(s) to be Shipped:   CF/Pulmonary: Jamie Webb 75MG /0.5ML & Jamie Webb 150MG /ML    Other medication(s) to be shipped: No additional medications requested for fill at this time     Jamie Webb, DOB: 1990-03-27  Phone: [phone] (home)       All above HIPAA information was verified with patient.     Was a Nurse, learning disability used for this call? No    Completed refill call assessment today to schedule patient's medication shipment from the [facility] [phone]).  All relevant notes have been reviewed.     Specialty medication(s) and dose(s) confirmed: Regimen is correct and unchanged.   Changes to medications: [name] reports no changes at this time.  Changes to insurance: No  New side effects reported not previously addressed with a pharmacist or physician: None reported  Questions for the pharmacist: No    Confirmed patient received a Conservation officer, historic buildings and a Surveyor, mining with first shipment. The patient will receive a drug information handout for each medication shipped and additional FDA Medication Guides as required.       DISEASE/MEDICATION-SPECIFIC INFORMATION        For patients on injectable medications: Patient currently has 0 doses left.  Next injection is scheduled for 04/17.    SPECIALTY MEDICATION ADHERENCE     Medication Adherence    Patient reported X missed doses in the last month: 0  Specialty Medication: Xolair 75mg /0.49ml  Patient is on additional specialty medications: Yes  Additional Specialty Medications: Xolair 150mg /ml  Patient Reported Additional Medication X Missed Doses in the Last Month: 0  Patient is on more than two specialty medications: No  Any gaps in refill history greater than 2 weeks in the last 3 months: no  Demonstrates understanding of importance of adherence: yes  Informant: patient  Reliability of informant: reliable  Provider-estimated medication adherence level: good  Patient is at risk for Non-Adherence: No  Reasons for non-adherence: no problems identified  Confirmed plan for next specialty medication refill: delivery by pharmacy  Refills needed for supportive medications: not needed          Refill Coordination    Has the Patients' Contact Information Changed: No  Is the Shipping Address Different: No         Were doses missed due to medication being on hold? No    XOLAIR 75/0.5 mg/ml: 0 days of medicine on hand   XOLAIR 150 mg/ml: 0 days of medicine on hand       Referral to the pharmacist: Not needed      [facility]     Shipping address confirmed in Epic.     Delivery Scheduled: Yes, Expected medication delivery date: 04/12.     Medication will be delivered via Same Day Courier to the prescription address in Epic WAM.    Jamie Webb   [facility] Specialty Technician

## 2021-05-21 MED ORDER — LISDEXAMFETAMINE 60 MG CAPSULE
ORAL_CAPSULE | Freq: Every morning | ORAL | 0 refills | 30 days | Status: CP
Start: 2021-05-21 — End: 2021-06-19

## 2021-05-24 MED FILL — XOLAIR 75 MG/0.5 ML SUBCUTANEOUS SYRINGE: SUBCUTANEOUS | 28 days supply | Qty: 1 | Fill #1

## 2021-05-24 MED FILL — XOLAIR 150 MG/ML SUBCUTANEOUS SYRINGE: SUBCUTANEOUS | 28 days supply | Qty: 2 | Fill #1

## 2021-06-19 NOTE — Unmapped (Signed)
[facility] Special[facility] Pharmacy Refill Coordination Note    Specialty Medication(s) to be Shipped:   CF/Pulmonary/Asthma: Xolair 75mg  /150mg   Other medication(s) to be shipped: No additional medications requested for fill at this time     Jamie Webb, DOB: [birth year]  Phone: [phone] (home)     All above HIPAA information was verified with patient.     Was a Nurse, learning disability used for this call? No    Completed refill call assessment today to schedule patient's medication shipment from the [facility] [phone]).  All relevant notes have been reviewed.     Specialty medication(s) and dose(s) confirmed: Regimen is correct and unchanged.   Changes to medications: [name] reports no changes at this time.  Changes to insurance: No  New side effects reported not previously addressed with a pharmacist or physician: None reported  Questions for the pharmacist: No    Confirmed patient received a Conservation officer, historic buildings and a Surveyor, mining with first shipment. The patient will receive a drug information handout for each medication shipped and additional FDA Medication Guides as required.       DISEASE/MEDICATION-SPECIFIC INFORMATION        For CF patients: CF [facility] Active? No-not enrolled    SPECIALTY MEDICATION ADHERENCE     Medication Adherence    Patient reported X missed doses in the last month: 0  Specialty Medication: Xolair 75mg /0.4ml Syrg  Patient is on additional specialty medications: Yes  Additional Specialty Medications: Xolair 150mg /86ml Syrg   Patient Reported Additional Medication X Missed Doses in the Last Month: 0  Patient is on more than two specialty medications: No  Informant: patient  Reliability of informant: reliable  Reasons for non-adherence: no problems identified        Were doses missed due to medication being on hold? No    Xolair 75mg /0.30ml Syrg: 0 days of medicine on hand (Next Injection : 06/26/21)  Xolair 150mg /1mg  Syrg: 0 days of medicine on hand (Next Injection : 06/26/21)    REFERRAL TO PHARMACIST     Referral to the pharmacist: Not needed    [facility]     Shipping address confirmed in Epic.     Delivery Scheduled: Yes, Expected medication delivery date: 06/22/2021.     Medication will be delivered via Same Day Courier to the prescription address in Epic WAM.    Talia Hoheisel P Allena Katz   [facility] Specialty Technician

## 2021-06-22 MED FILL — XOLAIR 75 MG/0.5 ML SUBCUTANEOUS SYRINGE: SUBCUTANEOUS | 28 days supply | Qty: 1 | Fill #2

## 2021-06-22 MED FILL — XOLAIR 150 MG/ML SUBCUTANEOUS SYRINGE: SUBCUTANEOUS | 28 days supply | Qty: 2 | Fill #2

## 2021-07-04 ENCOUNTER — Telehealth
Admit: 2021-07-04 | Discharge: 2021-07-05 | Payer: PRIVATE HEALTH INSURANCE | Attending: Psychiatric/Mental Health | Primary: Psychiatric/Mental Health

## 2021-07-04 DIAGNOSIS — F3340 Major depressive disorder, recurrent, in remission, unspecified: Principal | ICD-10-CM

## 2021-07-04 DIAGNOSIS — F902 Attention-deficit hyperactivity disorder, combined type: Principal | ICD-10-CM

## 2021-07-04 MED ORDER — DEXTROAMPHETAMINE-AMPHETAMINE 20 MG TABLET
ORAL_TABLET | Freq: Two times a day (BID) | ORAL | 0 refills | 30 days | Status: CP | PRN
Start: 2021-07-04 — End: 2021-08-03

## 2021-07-04 MED ORDER — LISDEXAMFETAMINE 60 MG CAPSULE
ORAL_CAPSULE | Freq: Every morning | ORAL | 0 refills | 30 days | Status: CP
Start: 2021-07-04 — End: 2021-08-02

## 2021-07-04 MED ORDER — HYDROXYZINE HCL 25 MG TABLET
ORAL_TABLET | Freq: Every day | ORAL | 1 refills | 90 days | Status: CP | PRN
Start: 2021-07-04 — End: 2021-12-31

## 2021-07-04 MED ORDER — GABAPENTIN 300 MG CAPSULE
ORAL_CAPSULE | Freq: Every evening | ORAL | 3 refills | 90 days | Status: CP | PRN
Start: 2021-07-04 — End: 2022-07-04

## 2021-07-04 NOTE — Unmapped (Incomplete)
[facility]  Psychiatry   Established Patient E&M Service - Outpatient       Assessment:    Jamie Webb presents for follow-up evaluation.     Last seen: 03/22/21    Epic notes reviewed-    Identifying Information:  Jamie Webb is a 31 y.o. female with a history of   Patient Active Problem List   Diagnosis   ??? Attention deficit hyperactivity disorder (ADHD), combined type, mild, in partial remission   ??? Dysmenorrhea   ??? Chronic sinusitis   ??? Routine general medical examination at a health care facility   ??? Depression, major, recurrent, in remission (CMS-HCC)       Risk Assessment:  An assessment of suicide and violence risk factors was performed as part of this evaluation and is not significantly changed from the last visit.              While future psychiatric events cannot be accurately predicted, the patient does not currently require acute inpatient psychiatric care and does not currently meet [facility] involuntary commitment criteria       Plan:    Problem 1:??ADHD  Status of problem:??improved or improving  Interventions:??  Vyvanse??60mg -????????Coaching about not chasing energy here, we are looking at function. ??Will continue to monitor.  Keeping regular Adderall??20mg ??and she takes as 10mg ??BID??for now.????  ??  Problem 2:??Mood- depression/anxiety  Status of problem:??chronic and stable, mostly resolved as we worked on treating the ADHD her functionality improved and less chaos in her life and mood improved. ??Now off antidpressants. ??She says they were only good for side effects. Bittany feels most of her mood issues are better explained as hormonal > psychiatry.  ??  Interventions:??  ??  Continue with hydroxyzine- prn usage a couple of times a week.   ??  Now off gabapentin.  ??  Suggested therapy. ??Daughter was going although didn't work out with one, trying to find a new one.  [name] could use her own.   ??        Opportunities to optimize healthy behaviors to include nutrition, exercise, and restorative sleep have been discussed. Patient / caregiver voiced understanding    Psychotherapy provided:  No billable psychotherapy service provided but brief supportive therapy was utilized.    Patient has been given this writer's contact information as well as the [facility] Psychiatry urgent line number. The patient has been instructed to call 911 for emergencies.          Subjective:    Chief complaint:  Follow-up psychiatric evaluation for ADHD and depression/anxiety.    Interval History: ***            Objective:  Metabolic Check  Wt Readings from Last 6 Encounters:   12/03/20 83.5 kg (184 lb)   09/16/20 83.5 kg (184 lb)   08/10/20 83.6 kg (184 lb 3.2 oz)   05/18/20 88.9 kg (196 lb)   03/28/20 88.9 kg (196 lb)   02/10/20 95.3 kg (210 lb)       Mental Status Exam:    Mental Status Exam:  Appearance:     Appears stated age, Well nourished, Well developed and Clean/Neat   Motor:    No abnormal movements   Speech/Language:     Normal rate, volume, tone, fluency   Mood:    ***   Affect:    Calm and Cooperative   Thought process and Associations:    Logical, linear, clear, coherent, goal directed   Abnormal/psychotic thought content:  Denies SI, HI, self harm, delusions, obsessions, paranoid ideation, or ideas of reference   Perceptual disturbances:      Denies auditory and visual hallucinations, behavior not concerning for response to internal stimuli                I personally spent *** minutes face-to-face and non-face-to-face in the care of this patient, which includes all pre, intra, and post visit time on the date of service.    I spent *** minutes on the {phone audio video visit:67489} with the patient on the date of service. I spent an additional *** minutes on pre- and post-visit activities.     The patient was physically located in [place] or a state in which I am permitted to provide care. The patient and/or parent/guardian understood that s/he may incur co-pays and cost sharing, and agreed to the telemedicine visit. The visit was reasonable and appropriate under the circumstances given the patient's presentation at the time.    The patient and/or parent/guardian has been advised of the potential risks and limitations of this mode of treatment (including, but not limited to, the absence of in-person examination) and has agreed to be treated using telemedicine. The patient's/patient's family's questions regarding telemedicine have been answered.     If the visit was completed in an ambulatory setting, the patient and/or parent/guardian has also been advised to contact their provider???s office for worsening conditions, and seek emergency medical treatment and/or call 911 if the patient deems either necessary.    Patient educated on every visit on the role of the Psychiatric Nurse Practitioner.  It was explained provider is not a Visual merchandiser.  They are aware this provider does psychiatric medications only and that they are responsible for seeing their primary care physician for medical needs and that there might be medical conditions that cause psychiatric symptoms (like thyroid issues) that will require collaborative work with this provider, their PCP, and the patient.        [name], PMHNP  07/04/2021

## 2021-07-17 NOTE — Unmapped (Signed)
[facility] Special[facility] Pharmacy Refill Coordination Note    Specialty Medication(s) to be Shipped:   CF/Pulmonary/Asthma: Xolair 150mg /ml Syrg & Xolair 75mg /0.62ml Syrg  Other medication(s) to be shipped: No additional medications requested for fill at this time     Jamie Webb, DOB: [birth year]  Phone: [phone] (home)     All above HIPAA information was verified with patient.     Was a Nurse, learning disability used for this call? No    Completed refill call assessment today to schedule patient's medication shipment from the [facility] [phone]).  All relevant notes have been reviewed.     Specialty medication(s) and dose(s) confirmed: Regimen is correct and unchanged.   Changes to medications: Jamie Webb reports no changes at this time.  Changes to insurance: No  New side effects reported not previously addressed with a pharmacist or physician: None reported  Questions for the pharmacist: No    Confirmed patient received a Conservation officer, historic buildings and a Surveyor, mining with first shipment. The patient will receive a drug information handout for each medication shipped and additional FDA Medication Guides as required.       DISEASE/MEDICATION-SPECIFIC INFORMATION        For patients on injectable medications: Patient currently has 0 doses left.  Next injection is scheduled for 07/24/2021.    SPECIALTY MEDICATION ADHERENCE     Medication Adherence    Patient reported X missed doses in the last month: 0  Specialty Medication: Xolair 150mg /ml Syrg  Patient is on additional specialty medications: Yes  Additional Specialty Medications: Xolair 75mg /0.26ml Syrg  Patient Reported Additional Medication X Missed Doses in the Last Month: 0  Patient is on more than two specialty medications: No  Informant: patient  Reliability of informant: reliable  Reasons for non-adherence: no problems identified        Were doses missed due to medication being on hold? No    Xolair 75mg /0.55ml Syrg: 0 days of medicine on hand Xolair 150mg /67ml Syrg: 0 days of medicine on hand     REFERRAL TO PHARMACIST     Referral to the pharmacist: Not needed    [facility]     Shipping address confirmed in Epic.     Delivery Scheduled: Yes, Expected medication delivery date: 07/19/2021.     Medication will be delivered via Same Day Courier to the prescription address in Epic WAM.    Jamie Webb   [facility] Specialty Technician

## 2021-07-19 MED FILL — XOLAIR 150 MG/ML SUBCUTANEOUS SYRINGE: SUBCUTANEOUS | 28 days supply | Qty: 2 | Fill #3

## 2021-07-19 MED FILL — XOLAIR 75 MG/0.5 ML SUBCUTANEOUS SYRINGE: SUBCUTANEOUS | 28 days supply | Qty: 1 | Fill #3

## 2021-08-01 MED ORDER — LISDEXAMFETAMINE 60 MG CAPSULE
ORAL_CAPSULE | Freq: Every morning | ORAL | 0 refills | 30 days | Status: CP
Start: 2021-08-01 — End: 2021-08-30

## 2021-08-01 MED ORDER — DEXTROAMPHETAMINE-AMPHETAMINE 20 MG TABLET
ORAL_TABLET | Freq: Two times a day (BID) | ORAL | 0 refills | 30 days | Status: CP | PRN
Start: 2021-08-01 — End: 2021-08-31

## 2021-08-16 NOTE — Unmapped (Signed)
[facility] Specialty Pharmacy Refill Coordination Note    Specialty Medication(s) to be Shipped:   CF/Pulmonary/Asthma: Xolair 150mg /ml  and Xolair 75mg /0.47ml    Other medication(s) to be shipped: No additional medications requested for fill at this time     Jamie Webb, DOB: Aug 21, 1990  Phone: [phone] (home)       All above HIPAA information was verified with patient.     Was a Nurse, learning disability used for this call? No    Completed refill call assessment today to schedule patient's medication shipment from the [facility] [phone]).  All relevant notes have been reviewed.     Specialty medication(s) and dose(s) confirmed: Regimen is correct and unchanged.   Changes to medications: [name] reports no changes at this time.  Changes to insurance: No  New side effects reported not previously addressed with a pharmacist or physician: None reported  Questions for the pharmacist: No    Confirmed patient received a Conservation officer, historic buildings and a Surveyor, mining with first shipment. The patient will receive a drug information handout for each medication shipped and additional FDA Medication Guides as required.       DISEASE/MEDICATION-SPECIFIC INFORMATION        For patients on injectable medications: Patient currently has 0 doses left.  Next injection is scheduled for 08/21/21.    SPECIALTY MEDICATION ADHERENCE     Medication Adherence    Patient reported X missed doses in the last month: 0  Specialty Medication: XOLAIR 150 mg/mL syringe (omalizumab)  Patient is on additional specialty medications: Yes  Additional Specialty Medications: XOLAIR 75 mg/0.5 mL syringe (omalizumab)  Patient Reported Additional Medication X Missed Doses in the Last Month: 0  Patient is on more than two specialty medications: No  Informant: patient  Reliability of informant: reliable  Provider-estimated medication adherence level: good  Reasons for non-adherence: no problems identified              Were doses missed due to medication being on hold? No    XOLAIR 75 mg/0.5 mL syringe  : 0 days of medicine on hand   XOLAIR 150 mg/mL syringe   : 0 days of medicine on hand        REFERRAL TO PHARMACIST     Referral to the pharmacist: Not needed      SHIPPING     Shipping address confirmed in Epic.     Delivery Scheduled: Yes, Expected medication delivery date: 08/18/21.     Medication will be delivered via Same Day Courier to the prescription address in Epic WAM.    Jamie Webb' [facility] Specialty Technician

## 2021-08-18 MED FILL — XOLAIR 75 MG/0.5 ML SUBCUTANEOUS SYRINGE: SUBCUTANEOUS | 28 days supply | Qty: 1 | Fill #4

## 2021-08-18 MED FILL — XOLAIR 150 MG/ML SUBCUTANEOUS SYRINGE: SUBCUTANEOUS | 28 days supply | Qty: 2 | Fill #4

## 2021-08-28 MED ORDER — DEXTROAMPHETAMINE-AMPHETAMINE 20 MG TABLET
ORAL_TABLET | Freq: Two times a day (BID) | ORAL | 0 refills | 30 days | Status: CP | PRN
Start: 2021-08-28 — End: 2021-09-27

## 2021-08-28 MED ORDER — LISDEXAMFETAMINE 60 MG CAPSULE
ORAL_CAPSULE | Freq: Every morning | ORAL | 0 refills | 30 days | Status: CP
Start: 2021-08-28 — End: 2021-09-26

## 2021-09-10 ENCOUNTER — Telehealth: Admit: 2021-09-10 | Discharge: 2021-09-11 | Payer: PRIVATE HEALTH INSURANCE | Attending: Family | Primary: Family

## 2021-09-10 DIAGNOSIS — R6889 Other general symptoms and signs: Principal | ICD-10-CM

## 2021-09-10 NOTE — Unmapped (Signed)
Virtual Care Outreach Results as of:  September 10, 2021 12:10 PM        Date of Patients Video: 09/10/21  Time of Patients Video Visit: 1220  Call Attempt's (1-5): 1  Did you speak with the patient?: yes  Confirmed patient's web browser: n/a  Confirmed MyChart Login: n/a  Confirmed Get Started Completed : n/a  Confirmed Camera: n/a  Confirmed Microphone: n/a  Confirmed Audio: n/a  Provided Support: No   Reason no support provided: patient declined  Patient Satisfied: n/a

## 2021-09-10 NOTE — Unmapped (Signed)
[place] Community education officer Encounter  This medical encounter was conducted virtually using [email or web address]  TeleHealth protocols.    Patient ID: Jamie Webb is a 31 y.o. female who presents by video interaction for evaluation.    I have identified myself to the patient and conveyed my credentials to [place].   Patient has signed informed consent on file in medical record.    Present on Video Call: Is there someone else in the room? No..    Assessment/Plan:    Diagnoses and all orders for this visit:    Complaining of head symptom         -- Patient verbalized an understanding of today's assessment and recommendations, as well as the purpose of ongoing medications.    Patient referred back to PCP and instructed to make appointment sooner rather than later.     Medication adherence and barriers to the treatment plan have been addressed. Opportunities to optimize healthy behaviors have been discussed. Patient / caregiver voiced understanding.          Subjective:     Pt presents with c/o newly developed dent/soft spot on the top of her head. She states she did her research on it and wants to make sure she is okay. She states she has been getting a lot of headaches and her research showed she could have Vitamin A Toxicity and bone disorders. She states this is concerning for her. She states it hurts and area is tender and it sinks I at night.  She denies any neurological changes. She states she has a primary care provider and does not see him that often. I explained to patient that with this complaint, she needs to be really seen in person and encouraged her to make an appointment with her PCP. She voiced understanding and states she would. She did appear stable in video and was actually in her car during the video call.     Jamie Webb is 31 y.o. and presents today in the [facility] for [employer].  The PCP for this patient is Marlaine Hind, PA.   Vitals taken by patient:  Height: Weight:  LMP:   Screenings:  Last pap:  C-scope:  Mammogram:  Vaccines:  Medications:  None  SH:   PMH:  PSH:   FH:  No care or health access concerns    ROS  Review of Systems   Constitutional: Negative.    Respiratory: Negative.     Skin:         Soft spot top of head      All other ROS per HPI.    I have reviewed the problem list, past medical history, past family history, medications, and allergies and have updated/reconciled them if needed.            Objective:   Physical Exam  Constitutional:       Appearance: Normal appearance.   Pulmonary:      Effort: Pulmonary effort is normal.   Neurological:      Mental Status: She is alert and oriented to person, place, and time.     As part of this Video Visit, no in-person exam was conducted.  Video interaction permitted the following observations.    General: No acute distress.   HEENT:  EOMI.  No photophobia. No conjunctival injection.    RESP: Relaxed respiratory effort. No conversational dyspnea.   SKIN: No rashes noted.  NEURO: No tremors observed.  Normal speech.  PSYCH: Alert and oriented.  Speech fluent and sensible.  Calm affect.           The patient reports they are currently: not at home. I spent 15 minutes on the real-time audio and video with the patient on the date of service. I spent an additional 15 minutes on pre- and post-visit activities on the date of service.     The patient was physically located in [place] or a state in which I am permitted to provide care. The patient and/or parent/guardian understood that s/he may incur co-pays and cost sharing, and agreed to the telemedicine visit. The visit was reasonable and appropriate under the circumstances given the patient's presentation at the time.    The patient and/or parent/guardian has been advised of the potential risks and limitations of this mode of treatment (including, but not limited to, the absence of in-person examination) and has agreed to be treated using telemedicine. The patient's/patient's family's questions regarding telemedicine have been answered.     If the visit was completed in an ambulatory setting, the patient and/or parent/guardian has also been advised to contact their provider???s office for worsening conditions, and seek emergency medical treatment and/or call 911 if the patient deems either necessary.

## 2021-09-11 ENCOUNTER — Ambulatory Visit: Admit: 2021-09-11 | Discharge: 2021-09-12 | Payer: PRIVATE HEALTH INSURANCE

## 2021-09-11 DIAGNOSIS — R519 Nonintractable headache, unspecified chronicity pattern, unspecified headache type: Principal | ICD-10-CM

## 2021-09-11 MED ORDER — ONDANSETRON HCL 4 MG TABLET
ORAL_TABLET | Freq: Every day | ORAL | 1 refills | 30 days | Status: CP | PRN
Start: 2021-09-11 — End: 2022-09-11

## 2021-09-11 MED ORDER — SUMATRIPTAN 50 MG TABLET
ORAL_TABLET | ORAL | 1 refills | 3 days | Status: CP | PRN
Start: 2021-09-11 — End: 2022-09-11

## 2021-09-11 MED ORDER — LISDEXAMFETAMINE 60 MG CAPSULE
ORAL_CAPSULE | Freq: Every morning | ORAL | 0 refills | 30 days | Status: CP
Start: 2021-09-11 — End: 2021-10-10

## 2021-09-11 NOTE — Unmapped (Signed)
Assessment and Plan  1. Nonintractable headache, unspecified chronicity pattern, unspecified headache type      Patient with more frequent headaches    - Start Imitrex and zofran.   - Referral placed to neurology headache specialist  - Follow up if symptoms do not improve    Requested Prescriptions     Signed Prescriptions Disp Refills    SUMAtriptan (IMITREX) 50 MG tablet 30 tablet 1     Sig: Take 1 tablet (50 mg total) by mouth every two (2) hours as needed for migraine (max of 2 per 24 hours).    ondansetron (ZOFRAN) 4 MG tablet 30 tablet 1     Sig: Take 1 tablet (4 mg total) by mouth daily as needed for nausea.     There are no discontinued medications.    No follow-ups on file.  Signs and symptoms that should prompt return sooner than scheduled were reviewed with the patient.        HPI    Chief Complaint  Chief Complaint   Patient presents with    Headache     Jamie Webb is here for headache    She noticed an indention at the top of her head that is tender. She has history of head injuries- last CT head in 2016 was negative. She also gets headaches from her neck and bluster headaches behind her eyes. She has scoliosis in her neck and used to see a Land. Last week her headache was worse. Sometimes headache is accompanied with nausea and used to have Zofran, She takes tylenol and ibuprofen. She was planning to have sinus surgery.. Asthma shots been helpful.     Allergies:  Patient has no known allergies.    Medications:   Outpatient Medications Prior to Visit   Medication Sig Dispense Refill    albuterol HFA 90 mcg/actuation inhaler Inhale 2 puffs two (2) times a day as needed for wheezing. 8 g 5    dextroamphetamine-amphetamine (ADDERALL) 20 mg tablet Take 0.5 tablets (10 mg total) by mouth two (2) times a day as needed (attention). 30 tablet 0    EPINEPHrine (EPIPEN) 0.3 mg/0.3 mL injection Inject 0.3 mL (0.3 mg total) into the muscle once for 1 dose. 2 each 0    famotidine (PEPCID) 20 MG tablet Take 1 tablet (20 mg total) by mouth daily. 30 tablet 1    fluoride, sodium, 1.1 % Pste       fluticasone propionate (FLONASE) 50 mcg/actuation nasal spray 2 sprays into each nostril daily. 16 g 0    fluticasone propionate (FLOVENT HFA) 220 mcg/actuation inhaler Inhale 3 puffs once as needed.      gabapentin (NEURONTIN) 300 MG capsule Take 1 capsule (300 mg total) by mouth nightly as needed (restless legs). 90 capsule 3    hydrOXYzine (ATARAX) 25 MG tablet Take 1 tablet (25 mg total) by mouth daily as needed for anxiety. 90 tablet 1    lisdexamfetamine (VYVANSE) 60 MG cap capsule Take 1 capsule (60 mg total) by mouth every morning. 30 capsule 0    omalizumab (XOLAIR) 150 mg/mL syringe Inject the contents of 1 syringe (150 mg total) under the skin every fourteen (14) days. Inject along with 75 mg syringe for total dose of 225 mg every 14 days. 2 mL 12    omalizumab (XOLAIR) 75 mg/0.5 mL syringe Inject the contents of 1 syringe (75 mg total) under the skin every fourteen (14) days. Inject along with 150 mg syringe for total dose  of 225 mg every 14 days. 1 mL 12    azelastine (ASTELIN) 137 mcg (0.1 %) nasal spray 1 spray into each nostril Two (2) times a day. Use in each nostril as directed (Patient not taking: Reported on 09/11/2021) 1.2 mL 11    empty container Misc USE AS DIRECTED (Patient not taking: Reported on 09/11/2021) 1 each 2    fluticasone propion-salmeteroL (ADVAIR HFA) 230-21 mcg/actuation inhaler Inhale 2 puffs Two (2) times a day. (Patient not taking: Reported on 09/11/2021) 12 g 3     No facility-administered medications prior to visit.       Medical History:  Past Medical History:   Diagnosis Date    Allergic rhinitis     Anxiety     Asthma     Attention deficit disorder     Testing done at [facility] health clinic in [place]    Chronic sinusitis     Depression     Dysmenorrhea     Financial difficulties     Headache     Hx of chronic bronchitis        Surgical History:  Past Surgical History:   Procedure Laterality Date    CESAREAN SECTION         Social History:  Tobacco use:   reports that she has been smoking cigarettes. She has been smoking an average of 1 pack per day. She has never used smokeless tobacco.  Alcohol use:   reports no history of alcohol use.  Drug use:  reports no history of drug use.      Family History:  Family History   Problem Relation Age of Onset    Anxiety disorder Mother     Depression Mother     ADD / ADHD Mother     Hypertension Father     Heart attack Father     ADD / ADHD Father     Diabetes Father     No Known Problems Sister     No Known Problems Brother     No Known Problems Maternal Aunt     No Known Problems Maternal Uncle     No Known Problems Paternal Aunt     Diabetes Paternal Uncle     Cancer Maternal Grandmother         breast    No Known Problems Maternal Grandfather     No Known Problems Paternal Grandmother     Diabetes Paternal Grandfather     Cancer Other         maternal great auntx2-breast    Anesthesia problems Neg Hx     Broken bones Neg Hx     Clotting disorder Neg Hx     Collagen disease Neg Hx     Dislocations Neg Hx     Fibromyalgia Neg Hx     Gout Neg Hx     Hemophilia Neg Hx     Osteoporosis Neg Hx     Rheumatologic disease Neg Hx     Scoliosis Neg Hx     Severe sprains Neg Hx     Sickle cell anemia Neg Hx     Spinal Compression Fracture Neg Hx            Review of Systems:  ROS:  A comprehensive 10+ review of systems was negative unless otherwise stated in the HPI.         Vitals:    09/11/21 0924   BP: 118/78   BP Site: L Arm  BP Position: Sitting   BP Cuff Size: Small   Pulse: 95   Temp: 36 ??C (96.8 ??F)   TempSrc: Temporal   SpO2: 99%   Weight: 83.9 kg (185 lb)       Physical Exam  General: alert, oriented, no distress  HEENT: Anicteric sclerae  RESP: Relaxed respiratory effort.      SKIN: Appropriately warm and dry  NEURO: no focal deficits,   EXT:  No peripheral edema      Medication adherence and barriers to the treatment plan have been addressed. Opportunities to optimize healthy behaviors have been discussed. Patient / caregiver voiced understanding.        I attest that I, Roxy Manns, personally documented this note while acting as scribe for Marlaine Hind, [place].      Roxy Manns, Scribe.  09/11/2021     The documentation recorded by the scribe accurately reflects the service I personally performed and the decisions made by me.    Marlaine Hind, PA

## 2021-09-11 NOTE — Unmapped (Addendum)
[facility] Refill Coordination Note    Jamie Webb, DOB: [birth year]  Phone: [phone] (home)       All above HIPAA information was verified with patient.         09/10/2021    12:01 PM   Specialty Rx Medication Refill Questionnaire   Which Medications would you like refilled and shipped? XOLAIR 75 mg/0.5 mL syringe and XOLAIR 150 mg/mL syringe , No supply on hand   Please list all current allergies: None   Have you missed any doses in the last 30 days? No   Have you had any changes to your medication(s) since your last refill? No   How many days remaining of each medication do you have at home? 0   If receiving an injectable medication, next injection date is 09/18/2021   Have you experienced any side effects in the last 30 days? No   Please enter the full address (street address, city, state, zip code) where you would like your medication(s) to be delivered to. [address] [place], [place] [address]   Please specify on which day you would like your medication(s) to arrive. Note: if you need your medication(s) within 3 days, please call the pharmacy to schedule your order at [phone]  09/14/2021   Has your insurance changed since your last refill? No   Would you like a pharmacist to call you to discuss your medication(s)? No   Do you require a signature for your package? (Note: if we are billing Medicare Part B or your order contains a controlled substance, we will require a signature) No         Completed refill call assessment today to schedule patient's medication shipment from the [facility] [phone]).  All relevant notes have been reviewed.       Confirmed patient received a Conservation officer, historic buildings and a Surveyor, mining with first shipment. The patient will receive a drug information handout for each medication shipped and additional FDA Medication Guides as required.         REFERRAL TO PHARMACIST     Referral to the pharmacist: Not needed      [facility] Shipping address confirmed in Epic.     Delivery Scheduled: Yes, Expected medication delivery date: 09/14/21.     Medication will be delivered via Same Day Courier to the prescription address in Epic WAM.    Jamie Webb' [facility] Specialty Technician

## 2021-09-14 MED FILL — XOLAIR 150 MG/ML SUBCUTANEOUS SYRINGE: SUBCUTANEOUS | 28 days supply | Qty: 2 | Fill #5

## 2021-09-14 MED FILL — XOLAIR 75 MG/0.5 ML SUBCUTANEOUS SYRINGE: SUBCUTANEOUS | 28 days supply | Qty: 1 | Fill #5

## 2021-09-28 MED ORDER — LISDEXAMFETAMINE 60 MG CAPSULE
ORAL_CAPSULE | Freq: Every morning | ORAL | 0 refills | 30 days | Status: CP
Start: 2021-09-28 — End: 2021-10-27

## 2021-10-03 DIAGNOSIS — F902 Attention-deficit hyperactivity disorder, combined type: Principal | ICD-10-CM

## 2021-10-03 MED ORDER — DEXTROAMPHETAMINE-AMPHETAMINE 20 MG TABLET
ORAL_TABLET | Freq: Two times a day (BID) | ORAL | 0 refills | 30 days | PRN
Start: 2021-10-03 — End: 2021-11-02

## 2021-10-03 NOTE — Unmapped (Signed)
I just spoke with this patient and she is requesting a refill on her Adderall 20 mg, as she has ran out. She is a old patient of Judie Grieve but will start seeing you 10/25/21.

## 2021-10-05 DIAGNOSIS — J4551 Severe persistent asthma with (acute) exacerbation: Principal | ICD-10-CM

## 2021-10-05 MED ORDER — DEXTROAMPHETAMINE-AMPHETAMINE 20 MG TABLET
ORAL_TABLET | Freq: Two times a day (BID) | ORAL | 0 refills | 30 days | Status: CP | PRN
Start: 2021-10-05 — End: 2021-11-04

## 2021-10-05 MED ORDER — EPINEPHRINE 0.3 MG/0.3 ML INJECTION, AUTO-INJECTOR
Freq: Once | INTRAMUSCULAR | 0 refills | 2 days | Status: CP
Start: 2021-10-05 — End: 2021-10-05

## 2021-10-05 NOTE — Unmapped (Signed)
Patient called again inquiring about this refill.

## 2021-10-05 NOTE — Unmapped (Signed)
[facility] Clinical Assessment & Refill Coordination Note    Jamie Webb, DOB: 1990-07-05  Phone: [phone] (home)     All above HIPAA information was verified with patient.     Was a Nurse, learning disability used for this call? No    Specialty Medication(s):   CF/Pulmonary/Asthma: Xolair 150mg  & 75mg      Current Outpatient Medications   Medication Sig Dispense Refill    albuterol HFA 90 mcg/actuation inhaler Inhale 2 puffs two (2) times a day as needed for wheezing. 8 g 5    azelastine (ASTELIN) 137 mcg (0.1 %) nasal spray 1 spray into each nostril Two (2) times a day. Use in each nostril as directed (Patient not taking: Reported on 09/11/2021) 1.2 mL 11    dextroamphetamine-amphetamine (ADDERALL) 20 mg tablet Take 0.5 tablets (10 mg total) by mouth two (2) times a day as needed (attention). 30 tablet 0    empty container Misc USE AS DIRECTED (Patient not taking: Reported on 09/11/2021) 1 each 2    EPINEPHrine (EPIPEN) 0.3 mg/0.3 mL injection Inject 0.3 mL (0.3 mg total) into the muscle once for 1 dose. 2 each 0    famotidine (PEPCID) 20 MG tablet Take 1 tablet (20 mg total) by mouth daily. 30 tablet 1    fluoride, sodium, 1.1 % Pste       fluticasone propion-salmeteroL (ADVAIR HFA) 230-21 mcg/actuation inhaler Inhale 2 puffs Two (2) times a day. (Patient not taking: Reported on 09/11/2021) 12 g 3    fluticasone propionate (FLONASE) 50 mcg/actuation nasal spray 2 sprays into each nostril daily. 16 g 0    fluticasone propionate (FLOVENT HFA) 220 mcg/actuation inhaler Inhale 3 puffs once as needed.      gabapentin (NEURONTIN) 300 MG capsule Take 1 capsule (300 mg total) by mouth nightly as needed (restless legs). 90 capsule 3    hydrOXYzine (ATARAX) 25 MG tablet Take 1 tablet (25 mg total) by mouth daily as needed for anxiety. 90 tablet 1    lisdexamfetamine (VYVANSE) 60 MG cap capsule Take 1 capsule (60 mg total) by mouth every morning. 30 capsule 0    lisdexamfetamine (VYVANSE) 60 MG cap capsule Take 1 capsule (60 mg total) by mouth every morning. 30 capsule 0    omalizumab (XOLAIR) 150 mg/mL syringe Inject the contents of 1 syringe (150 mg total) under the skin every fourteen (14) days. Inject along with 75 mg syringe for total dose of 225 mg every 14 days. 2 mL 12    omalizumab (XOLAIR) 75 mg/0.5 mL syringe Inject the contents of 1 syringe (75 mg total) under the skin every fourteen (14) days. Inject along with 150 mg syringe for total dose of 225 mg every 14 days. 1 mL 12    ondansetron (ZOFRAN) 4 MG tablet Take 1 tablet (4 mg total) by mouth daily as needed for nausea. 30 tablet 1    SUMAtriptan (IMITREX) 50 MG tablet Take 1 tablet (50 mg total) by mouth every two (2) hours as needed for migraine (max of 2 per 24 hours). 30 tablet 1     No current facility-administered medications for this visit.        Changes to medications: Jamie Webb reports no changes at this time.    No Known Allergies    Changes to allergies: No    SPECIALTY MEDICATION ADHERENCE     Xolair 75  mg/0.35mL : 0 days of medicine on hand   Xolair 150 mg/ml: 0 days of  medicine on hand     Medication Adherence    Patient reported X missed doses in the last month: 0  Specialty Medication: Xolair 150mg /mL Q14d  Patient is on additional specialty medications: Yes  Additional Specialty Medications: Xolair 75 mg/0.54mL Q14d  Patient Reported Additional Medication X Missed Doses in the Last Month: 0  Patient is on more than two specialty medications: No  Any gaps in refill history greater than 2 weeks in the last 3 months: no  Demonstrates understanding of importance of adherence: yes  Informant: patient                            Specialty medication(s) dose(s) confirmed: Regimen is correct and unchanged.     Are there any concerns with adherence? No    Adherence counseling provided? Not needed    CLINICAL MANAGEMENT AND INTERVENTION      Clinical Benefit Assessment:    Do you feel the medicine is effective or helping your condition? Yes    Clinical Benefit counseling provided? Not needed    Adverse Effects Assessment:    Are you experiencing any side effects? No    Are you experiencing difficulty administering your medicine? No    Quality of Life Assessment:    Quality of Life    Rheumatology  Oncology  Dermatology  Cystic Fibrosis          How many days over the past month did your asthma  keep you from your normal activities? For example, brushing your teeth or getting up in the morning. 0    Have you discussed this with your provider? Not needed    Acute Infection Status:    Acute infections noted within Epic:  No active infections  Patient reported infection: None    Therapy Appropriateness:    Is therapy appropriate and patient progressing towards therapeutic goals? Yes, therapy is appropriate and should be continued    DISEASE/MEDICATION-SPECIFIC INFORMATION      For patients on injectable medications: Patient currently has 0 doses left.  Next injection is scheduled for 9/4.    PATIENT SPECIFIC NEEDS     Does the patient have any physical, cognitive, or cultural barriers? No    Is the patient high risk? No    Does the patient require a Care Management Plan? No     SOCIAL DETERMINANTS OF HEALTH     At the [facility], we have learned that life circumstances - like trouble affording food, housing, utilities, or transportation can affect the health of many of our patients.   That is why we wanted to ask: are you currently experiencing any life circumstances that are negatively impacting your health and/or quality of life? No    Social Determinants of Health     Financial Resource Strain: High Risk (10/03/2020)    Overall Financial Resource Strain (CARDIA)     Difficulty of Paying Living Expenses: Very hard   Internet Connectivity: Not on file   Food Insecurity: Food Insecurity Present (10/03/2020)    Hunger Vital Sign     Worried About Running Out of Food in the Last Year: Sometimes true     Ran Out of Food in the Last Year: Sometimes true   Tobacco Use: High Risk (09/11/2021)    Patient History     Smoking Tobacco Use: Some Days     Smokeless Tobacco Use: Never     Passive Exposure: Not on file  Housing/Utilities: Low Risk  (10/03/2020)    Housing/Utilities     Within the past 12 months, have you ever stayed: outside, in a car, in a tent, in an overnight shelter, or temporarily in someone else's home (i.e. couch-surfing)?: No     Are you worried about losing your housing?: No     Within the past 12 months, have you been unable to get utilities (heat, electricity) when it was really needed?: No   Alcohol Use: Not At Risk (10/03/2020)    Alcohol Use     How often do you have a drink containing alcohol?: Monthly or less     How many drinks containing alcohol do you have on a typical day when you are drinking?: 1 - 2     How often do you have 5 or more drinks on one occasion?: Never   Transportation Needs: Unmet Transportation Needs (10/03/2020)    PRAPARE - Transportation     Lack of Transportation (Medical): Yes     Lack of Transportation (Non-Medical): No   Substance Use: Low Risk  (10/03/2020)    Substance Use     Taken prescription drugs for non-medical reasons: Never     Taken illegal drugs: Never     Patient indicated they have taken drugs in the past year for non-medical reasons: Yes, [positive answer(s)]: Not on file   Health Literacy: Low Risk  (10/03/2020)    Health Literacy     : Never   Physical Activity: Sufficiently Active (10/03/2020)    Exercise Vital Sign     Days of Exercise per Week: 5 days     Minutes of Exercise per Session: 60 min   Interpersonal Safety: Not on file   Stress: Stress Concern Present (10/03/2020)    [employer] of Occupational Health - Occupational Stress Questionnaire     Feeling of Stress : Very much   Intimate Partner Violence: Not At Risk (10/03/2020)    Humiliation, Afraid, Rape, and Kick questionnaire     Fear of Current or Ex-Partner: No     Emotionally Abused: No     Physically Abused: No     Sexually Abused: No   Depression: Not at risk (12/03/2020)    PHQ-2     PHQ-2 Score: 1   Social Connections: Socially Integrated (10/03/2020)    Social Connection and Isolation Panel [NHANES]     Frequency of Communication with Friends and Family: More than three times a week     Frequency of Social Gatherings with Friends and Family: More than three times a week     Attends Religious Services: 1 to 4 times per year     Active Member of [employer] or Organizations: No     Attends Engineer, structural: 1 to 4 times per year     Marital Status: Living with partner       Would you be willing to receive help with any of the needs that you have identified today? Not applicable       SHIPPING     Specialty Medication(s) to be Shipped:   CF/Pulmonary/Asthma: Xolair 75mg  & 150mg     Other medication(s) to be shipped: No additional medications requested for fill at this time     Changes to insurance: No    Delivery Scheduled: Yes, Expected medication delivery date: 10/12/21.     Medication will be delivered via Same Day Courier to the confirmed prescription address in [facility].    The patient will  receive a drug information handout for each medication shipped and additional FDA Medication Guides as required.  Verified that patient has previously received a Conservation officer, historic buildings and a Surveyor, mining.    The patient or caregiver noted above participated in the development of this care plan and knows that they can request review of or adjustments to the care plan at any time.      All of the patient's questions and concerns have been addressed.    Oliva Bustard   [facility] Specialty Pharmacist

## 2021-10-11 MED ORDER — LISDEXAMFETAMINE 30 MG CAPSULE
ORAL_CAPSULE | Freq: Every morning | ORAL | 0 refills | 30.00000 days | Status: CP
Start: 2021-10-11 — End: 2021-11-10
  Filled 2021-10-12: qty 60, 30d supply, fill #0

## 2021-10-11 MED ORDER — LISDEXAMFETAMINE 60 MG CAPSULE
ORAL_CAPSULE | Freq: Every morning | ORAL | 0 refills | 30 days | Status: CN
Start: 2021-10-11 — End: 2021-11-09

## 2021-10-11 NOTE — Unmapped (Signed)
Patient called stating that she cannot find Vyvanse 60 mg in stock at any local pharmacy.  Patient was hoping to change to a different medication that would be easier to find at pharmacy.  Patient has tried Adderall XR in the past.  She is also willing to take more of the Adderall IR just to hold her over until she can find Vyvanse 60 mg in stock again.  She would like to talk to provider as soon as possible to discuss her options.

## 2021-10-12 MED FILL — XOLAIR 75 MG/0.5 ML SUBCUTANEOUS SYRINGE: SUBCUTANEOUS | 28 days supply | Qty: 1 | Fill #6

## 2021-10-12 MED FILL — XOLAIR 150 MG/ML SUBCUTANEOUS SYRINGE: SUBCUTANEOUS | 28 days supply | Qty: 2 | Fill #6

## 2021-10-25 ENCOUNTER — Telehealth
Admit: 2021-10-25 | Discharge: 2021-10-26 | Payer: PRIVATE HEALTH INSURANCE | Attending: Psychiatric/Mental Health | Primary: Psychiatric/Mental Health

## 2021-10-25 DIAGNOSIS — F3340 Major depressive disorder, recurrent, in remission, unspecified: Principal | ICD-10-CM

## 2021-10-25 DIAGNOSIS — F902 Attention-deficit hyperactivity disorder, combined type: Principal | ICD-10-CM
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
## 2021-10-27 MED ORDER — FLUOXETINE 10 MG CAPSULE
ORAL_CAPSULE | ORAL | 0 refills | 90 days | Status: CP
Start: 2021-10-27 — End: 2022-01-25

## 2021-11-06 ENCOUNTER — Ambulatory Visit
Admit: 2021-11-06 | Discharge: 2021-11-07 | Payer: PRIVATE HEALTH INSURANCE | Attending: Psychiatric/Mental Health | Primary: Psychiatric/Mental Health

## 2021-11-06 DIAGNOSIS — F902 Attention-deficit hyperactivity disorder, combined type: Principal | ICD-10-CM

## 2021-11-06 DIAGNOSIS — F3340 Major depressive disorder, recurrent, in remission, unspecified: Principal | ICD-10-CM

## 2021-11-06 MED ORDER — DEXTROAMPHETAMINE-AMPHETAMINE 20 MG TABLET
ORAL_TABLET | Freq: Every day | ORAL | 0 refills | 30.00000 days | Status: CP
Start: 2021-11-06 — End: 2021-12-06

## 2021-11-06 MED ORDER — LISDEXAMFETAMINE 60 MG CAPSULE
ORAL_CAPSULE | Freq: Every morning | ORAL | 0 refills | 30.00000 days | Status: CP
Start: 2021-11-06 — End: 2021-11-06
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
## 2021-11-06 NOTE — Unmapped (Signed)
All your transmissions failed.

## 2021-11-06 NOTE — Unmapped (Signed)
[facility] Refill Coordination Note    Jamie Webb, DOB: 1990-06-03  Phone: [phone] (home)       All above HIPAA information was verified with patient.         11/05/2021     5:01 PM   Specialty Rx Medication Refill Questionnaire   Which Medications would you like refilled and shipped? XOLAIR 75 mg/0.5 mL syringe, XOLAIR 150 mg/mL syringe, 0 - doses on hand   Please list all current allergies: None   Have you missed any doses in the last 30 days? No   Have you had any changes to your medication(s) since your last refill? No   How many days remaining of each medication do you have at home? 0   If receiving an injectable medication, next injection date is 11/13/2021   Have you experienced any side effects in the last 30 days? No   Please enter the full address (street address, city, state, zip code) where you would like your medication(s) to be delivered to. [address] [place], [place] [address]   Please specify on which day you would like your medication(s) to arrive. Note: if you need your medication(s) within 3 days, please call the pharmacy to schedule your order at [phone]  11/09/2021   Has your insurance changed since your last refill? No   Would you like a pharmacist to call you to discuss your medication(s)? No   Do you require a signature for your package? (Note: if we are billing Medicare Part B or your order contains a controlled substance, we will require a signature) No         Completed refill call assessment today to schedule patient's medication shipment from the [facility] [phone]).  All relevant notes have been reviewed.       Confirmed patient received a Conservation officer, historic buildings and a Surveyor, mining with first shipment. The patient will receive a drug information handout for each medication shipped and additional FDA Medication Guides as required.         REFERRAL TO PHARMACIST     Referral to the pharmacist: Not needed      [facility]     Shipping address confirmed in Epic.     Delivery Scheduled: Yes, Expected medication delivery date: 11/09/21.     Medication will be delivered via Same Day Courier to the prescription address in Epic WAM.    Jamie Webb

## 2021-11-07 MED ORDER — DEXTROAMPHETAMINE-AMPHETAMINE 20 MG TABLET
ORAL_TABLET | Freq: Every day | ORAL | 0 refills | 30 days | Status: CP
Start: 2021-11-07 — End: 2021-12-07

## 2021-11-07 MED ORDER — LISDEXAMFETAMINE 60 MG CAPSULE
ORAL_CAPSULE | Freq: Every morning | ORAL | 0 refills | 30.00000 days | Status: CP
Start: 2021-11-07 — End: 2021-12-07

## 2021-11-09 MED FILL — XOLAIR 150 MG/ML SUBCUTANEOUS SYRINGE: SUBCUTANEOUS | 28 days supply | Qty: 2 | Fill #7

## 2021-11-09 MED FILL — XOLAIR 75 MG/0.5 ML SUBCUTANEOUS SYRINGE: SUBCUTANEOUS | 28 days supply | Qty: 1 | Fill #7

## 2021-12-05 ENCOUNTER — Ambulatory Visit
Admit: 2021-12-05 | Discharge: 2021-12-06 | Payer: PRIVATE HEALTH INSURANCE | Attending: Student in an Organized Health Care Education/Training Program | Primary: Student in an Organized Health Care Education/Training Program

## 2021-12-05 DIAGNOSIS — M5417 Radiculopathy, lumbosacral region: Principal | ICD-10-CM

## 2021-12-05 DIAGNOSIS — J45909 Unspecified asthma, uncomplicated: Principal | ICD-10-CM

## 2021-12-05 DIAGNOSIS — M25552 Pain in left hip: Principal | ICD-10-CM

## 2021-12-05 DIAGNOSIS — F17200 Nicotine dependence, unspecified, uncomplicated: Principal | ICD-10-CM

## 2021-12-05 DIAGNOSIS — Z130 Encounter for screening for diseases of the blood and blood-forming organs and certain disorders involving the immune mechanism: Principal | ICD-10-CM

## 2021-12-05 DIAGNOSIS — Z13228 Encounter for screening for other metabolic disorders: Principal | ICD-10-CM

## 2021-12-05 DIAGNOSIS — R739 Hyperglycemia, unspecified: Principal | ICD-10-CM

## 2021-12-05 DIAGNOSIS — F909 Attention-deficit hyperactivity disorder, unspecified type: Principal | ICD-10-CM

## 2021-12-05 DIAGNOSIS — Z1329 Encounter for screening for other suspected endocrine disorder: Principal | ICD-10-CM

## 2021-12-05 DIAGNOSIS — Z13220 Encounter for screening for lipoid disorders: Principal | ICD-10-CM

## 2021-12-05 DIAGNOSIS — Z1159 Encounter for screening for other viral diseases: Principal | ICD-10-CM

## 2021-12-05 DIAGNOSIS — E559 Vitamin D deficiency, unspecified: Principal | ICD-10-CM

## 2021-12-05 DIAGNOSIS — Z114 Encounter for screening for human immunodeficiency virus [HIV]: Principal | ICD-10-CM

## 2021-12-05 DIAGNOSIS — R208 Other disturbances of skin sensation: Principal | ICD-10-CM

## 2021-12-05 LAB — CBC W/ AUTO DIFF
BASOPHILS ABSOLUTE COUNT: 0.1 10*9/L (ref 0.0–0.1)
BASOPHILS RELATIVE PERCENT: 0.9 %
EOSINOPHILS ABSOLUTE COUNT: 0.8 10*9/L — ABNORMAL HIGH (ref 0.0–0.5)
EOSINOPHILS RELATIVE PERCENT: 10.8 %
HEMATOCRIT: 36.7 % (ref 34.0–44.0)
HEMOGLOBIN: 12.9 g/dL (ref 11.3–14.9)
LYMPHOCYTES ABSOLUTE COUNT: 2.4 10*9/L (ref 1.1–3.6)
LYMPHOCYTES RELATIVE PERCENT: 30.5 %
MEAN CORPUSCULAR HEMOGLOBIN CONC: 35.2 g/dL (ref 32.0–36.0)
MEAN CORPUSCULAR HEMOGLOBIN: 32.6 pg — ABNORMAL HIGH (ref 25.9–32.4)
MEAN CORPUSCULAR VOLUME: 92.6 fL (ref 77.6–95.7)
MEAN PLATELET VOLUME: 7.9 fL (ref 6.8–10.7)
MONOCYTES ABSOLUTE COUNT: 0.4 10*9/L (ref 0.3–0.8)
MONOCYTES RELATIVE PERCENT: 5.4 %
NEUTROPHILS ABSOLUTE COUNT: 4.1 10*9/L (ref 1.8–7.8)
NEUTROPHILS RELATIVE PERCENT: 52.4 %
PLATELET COUNT: 321 10*9/L (ref 150–450)
RED BLOOD CELL COUNT: 3.96 10*12/L (ref 3.95–5.13)
RED CELL DISTRIBUTION WIDTH: 12.8 % (ref 12.2–15.2)
WBC ADJUSTED: 7.7 10*9/L (ref 3.6–11.2)

## 2021-12-05 LAB — COMPREHENSIVE METABOLIC PANEL
ALBUMIN: 4.1 g/dL (ref 3.4–5.0)
ALKALINE PHOSPHATASE: 74 U/L (ref 46–116)
ALT (SGPT): 13 U/L (ref 10–49)
ANION GAP: 4 mmol/L — ABNORMAL LOW (ref 5–14)
AST (SGOT): 25 U/L (ref <=34)
BILIRUBIN TOTAL: 0.3 mg/dL (ref 0.3–1.2)
BLOOD UREA NITROGEN: 11 mg/dL (ref 9–23)
BUN / CREAT RATIO: 16
CALCIUM: 8.7 mg/dL (ref 8.7–10.4)
CHLORIDE: 108 mmol/L — ABNORMAL HIGH (ref 98–107)
CO2: 26 mmol/L (ref 20.0–31.0)
CREATININE: 0.68 mg/dL
EGFR CKD-EPI (2021) FEMALE: >90 mL/min/{1.73_m2} (ref >=60)
GLUCOSE RANDOM: 93 mg/dL (ref 70–179)
POTASSIUM: 4.2 mmol/L (ref 3.4–4.8)
PROTEIN TOTAL: 7.2 g/dL (ref 5.7–8.2)
SODIUM: 138 mmol/L (ref 135–145)

## 2021-12-05 LAB — LIPID PANEL
CHOLESTEROL/HDL RATIO SCREEN: 3.8 (ref 1.0–4.5)
CHOLESTEROL: 165 mg/dL (ref <=200)
HDL CHOLESTEROL: 43 mg/dL (ref 40–60)
LDL CHOLESTEROL CALCULATED: 105 mg/dL — ABNORMAL HIGH (ref 40–99)
NON-HDL CHOLESTEROL: 122 mg/dL (ref 70–130)
TRIGLYCERIDES: 84 mg/dL (ref 0–150)
VLDL CHOLESTEROL CAL: 16.8 mg/dL (ref 8–32)

## 2021-12-05 LAB — HEMOGLOBIN A1C
ESTIMATED AVERAGE GLUCOSE: 108 mg/dL
HEMOGLOBIN A1C: 5.4 % (ref 4.8–5.6)

## 2021-12-05 LAB — TSH: THYROID STIMULATING HORMONE: 1.49 u[IU]/mL (ref 0.550–4.780)

## 2021-12-05 NOTE — Unmapped (Signed)
[facility] Refill Coordination Note    Specialty Medication(s) to be Shipped:   CF/Pulmonary/Asthma: Xolair 75mg /0.65ml  Xolair 150mg /ml    Other medication(s) to be shipped: No additional medications requested for fill at this time     Jamie Webb, DOB: 1990/11/16  Phone: [phone] (home)       All above HIPAA information was verified with patient.     Was a Nurse, learning disability used for this call? No    Completed refill call assessment today to schedule patient's medication shipment from the [facility] [phone]).  All relevant notes have been reviewed.     Specialty medication(s) and dose(s) confirmed: Regimen is correct and unchanged.   Changes to medications: [name] reports no changes at this time.  Changes to insurance: No  New side effects reported not previously addressed with a pharmacist or physician: None reported  Questions for the pharmacist: No    Confirmed patient received a Conservation officer, historic buildings and a Surveyor, mining with first shipment. The patient will receive a drug information handout for each medication shipped and additional FDA Medication Guides as required.       DISEASE/MEDICATION-SPECIFIC INFORMATION        For patients on injectable medications: Patient currently has 0 doses left.  Next injection is scheduled for 12/11/21.    SPECIALTY MEDICATION ADHERENCE     Medication Adherence    Patient reported X missed doses in the last month: 0  Specialty Medication: XOLAIR 150 mg/mL syringe (omalizumab)  Patient is on additional specialty medications: Yes  Additional Specialty Medications: XOLAIR 75 mg/0.5 mL syringe (omalizumab)  Patient Reported Additional Medication X Missed Doses in the Last Month: 0  Patient is on more than two specialty medications: No  Any gaps in refill history greater than 2 weeks in the last 3 months: no  Informant: patient                                Were doses missed due to medication being on hold? No    XOLAIR 150 mg/mL syringe (omalizumab)  : 0 days of medicine on hand   XOLAIR 75 mg/0.5 mL syringe (omalizumab)  : 0 days of medicine on hand       REFERRAL TO PHARMACIST     Referral to the pharmacist: Not needed      [facility]     Shipping address confirmed in Epic.     Delivery Scheduled: Yes, Expected medication delivery date: 12/08/21.     Medication will be delivered via Same Day Courier to the prescription address in Epic WAM.    Jamie Webb Shared [facility] Specialty Technician

## 2021-12-05 NOTE — Unmapped (Signed)
Assessment and Plan:       Diagnoses and all orders for this visit:    Attention deficit hyperactivity disorder (ADHD), unspecified ADHD type    Asthma, unspecified asthma severity, unspecified whether complicated, unspecified whether persistent    Need for hepatitis C screening test  -     Hepatitis C antibody; Future    Encounter for screening for HIV  -     HIV Antigen/Antibody Combo; Future    Screening for endocrine, metabolic and immunity disorder  -     Comprehensive Metabolic Panel; Future    Screening for deficiency anemia  -     CBC w/ Differential; Future    Encounter for screening for lipid disorder  -     Lipid Panel; Future    Vitamin D deficiency  -     Vitamin D 25 Hydroxy (25OH D2 + D3); Future    Feels hot  -     TSH; Future    Hyperglycemia  -     Hemoglobin A1c; Future    Lumbosacral radiculopathy at S1  -     MRI lumbar spine without contrast; Future  -     MRI Lower Extremity Joint Left Wo Contrast; Future    Left hip pain  -     MRI lumbar spine without contrast; Future  -     MRI Lower Extremity Joint Left Wo Contrast; Future    Other orders  -     INFLUENZA VACCINE (QUAD) IM - 6 MO-ADULT - PF    -----------------------------------------------------------------------------------  31 yo female presents to establish care   -Labs listed above   -Not on contraception at this time   -BP WNL   -BMI 29.89   -Received flu shot today   -Pap in 07/2019 (NIL, HPV negative)    Lumbosacral Radiculopathy at S1, Left hip pain  -Has seen orthopedics in the past as well as worked with physical therapy.   -She has completed prednisone taper as well as additional medications such as Flexeril, Tramadol, Gabapentin.   -She continues to have left hip/low back pain that will radiate towards her groin as well as down her leg. She has been doing conservative therapies for almost 6 months. Discussed possible differentials including bursitis vs ligamental injury vs arthritis.   -It was noted per ortho that they were plans to obtain MRI of lumbar spine.   -Given persistent pain and Orthopedics plan to obtain MRI, will obtain MRI of lumbar spine as well as left hip.       Asthma   -Chronic, unsure of when she was diagnosed with asthma. Patient denies ever being intubated or admitted to the hospital secondary to her asthma. She has had however multiple exacerbations that required antibiotics and steroids.   -Currently receiving Xolair for her asthma which has significantly helped her symptoms. Med list also includes:  Advair BID and Albuterol   -Last saw Pulmonology 03/28/20, PFT in 2021  -Continue current regimen. Briefly discussed smoking cessation and importance of stopping especially with her history of asthma. Will discuss more next visit  -Given history of asthma and tobacco use, did discuss receiving pneumococcal vaccine in the future. Patient is agreeable to receiving next visit.       ADHD. Anxiety   -Long standing history of ADHD and Anxiety. Currently following with Psychiatry   -Current medication regimen includes: Prozac 20mg , Vyvance 60mg  in the AM and adderall 20mg  at non  -PHQ-9: 0  -Previously prescribed Xanax,  Celexa   -Continue current regimen, continue following with psychiatry     Tobacco Use Disorder  -Significant smoking history at this time, smoking for at least 14 years, at least 1/2 PPD   -Will need to discuss smoking cessation more in depth next visit next visit   -She was previously referred to [facility] treatment program (will provide information again next visit. Phone number: [phone])    Chronic Sinusitis, Headaches  -was previously started on PRN imitrex as well as Zofran and referred to neurology (does not appear to have seen however).   -Previously Saw ENT with plans to have surgery but surgery was cancelled. She notes she will reschedule once she has more time  -No concerns regarding her Sinuses at this time. Advised could use saline nasal spray vs flonase.       To follow up in 1 month or sooner for acute concerns.   Of note, there is a strong family history of breast cancer in her family. Her mother passed away at the age of 77 but had abnormal mammogram. Her maternal grandmother was diagnosed with breast cancer in her 33s. Will need to discuss performing breast exam next visit as well as discuss obtaining breast ultrasound vs mammogram next visit.       Subjective:     Jamie Webb 30 y.o.female  presents to establish care.     History includes:   -Back pain/Scoliosis. Previously saw Ortho as well as PT. Notes Flexiril does help her back pain. Last flare up for back was 1.5 months ago. She does PT exercises at home  -Last Xray in April of this year: 06/09/2021, with 2 views using AP and lateral views. Xrays revealed normal alignment with very mild L5-S1 disc space narrowing. There is no osteophyte formation or subluxation and normal curvature. The pelvis intact with no hip joint degenerative changes seen    Ortho plan was:   Impression  Encounter Diagnoses   Name Primary?    Acute left-sided low back pain with left-sided sciatica Yes    Lumbosacral radiculopathy at S1     Plan   1. I discussed the physical exam finding as well as her x-ray results and previous treatment. I discussed the nature of her back and leg pain.  2. The patient was placed on methylprednisolone 8 mg tapering dose for 10 days.  3. The patient will stay on the Flexeril at nighttime.  4. She was given tramadol for breakthrough pain.  5. She was sent to physical therapy for lumbar rehab.  6. The patient will follow-up in 3 weeks for reevaluation of her lower back. At one point we will need to get an MRI of the lumbar spine.     History of ADHD, Anxiety, following with psychiatry. Regimen includes: Prozac 20mg  every day, Vyvance 60mg  in the AM, Adderall around noon, PRN Hydroxyzine (but does not take it)     History of asthma. Previously saw Pulmonology. She is receiving Xolair shots which has helped her asthma significantly. Also takes PRN Albterol.     Sexuality: with long term partner   Home environment: lives with Female partner, 9yo daughter, 15-yo step children twins. 2 dogs and     Drug use: Denies  Tobacco use: 1/2 PPD, since [age] yo. 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     History of ovarian cysts. NO history of  abnormal pap per patient. She does not want to use birth control. Not using condoms      ROS:     ROS negative unless otherwise noted in HPI  Denies Bowel/bladder incontinence, saddle anesthesia.   Denies fever/chills/night sweats   History of sinus issues/concerns  Denies Chest pain, palpitations, shortness of breath     Vital Signs:     Wt Readings from Last 3 Encounters:   12/05/21 84 kg (185 lb 3.2 oz)   11/06/21 86.5 kg (190 lb 9.6 oz)   09/11/21 83.9 kg (185 lb)     Temp Readings from Last 3 Encounters:   12/05/21 37.1 ??C (98.7 ??F) (Temporal)   09/11/21 36 ??C (96.8 ??F) (Temporal)   12/03/20 36.7 ??C (98.1 ??F)     BP Readings from Last 3 Encounters:   12/05/21 114/70   11/06/21 131/80   09/11/21 118/78     Pulse Readings from Last 3 Encounters:   12/05/21 89   11/06/21 87   09/11/21 95       Objective:     General Appearance: Alert, cooperative, no distress, appears stated age.   Head and Neck: Normocephalic, atraumatic.  EENT: EOMI  Cardiovascular:  Regular rate and rhythm. No murmurs.  No pitting edema  Respiratory: Poor air movement throughout lungs but no cough/wheeze/rhonchi noted. No conversational dyspnea. Effort normal.   Breast: Symmetrical. No masses or lumps. No nipple discharge.  No abnormal axillary nodes  Musculoskeletal: Gait and station unremarkable. Normal ROM. Normal strength and tone. Straight leg test NEGATIVE bilaterally. Decreased hip flexion of left side compared to right. Muscle extremity of lower extremity equal and intact. Tenderness to palpation of left hip as well. Hypertonicity of lumbar paraspinals.   Extremities :No edema.  Peripheral pulses normal  Integumentary: Warm and dry.No rashes.  No abnormal appearing skin lesions  LYMPH NODES: Cervical, supraclavicular, and axillary nodes normal  Neurologic: Alert and oriented to place and time. CN II-XII grossly intact. No sensory deficit of lower extremities noted at visit.   Psychiatric: Mood and affect appropriate for situation.

## 2021-12-06 LAB — HEPATITIS C ANTIBODY: HEPATITIS C ANTIBODY: NONREACTIVE

## 2021-12-06 LAB — HIV ANTIGEN/ANTIBODY COMBO: HIV ANTIGEN/ANTIBODY COMBO: NONREACTIVE

## 2021-12-06 MED ORDER — DEXTROAMPHETAMINE-AMPHETAMINE 20 MG TABLET
ORAL_TABLET | Freq: Every day | ORAL | 0 refills | 30.00000 days | Status: CP
Start: 2021-12-06 — End: 2022-01-05

## 2021-12-08 MED FILL — XOLAIR 150 MG/ML SUBCUTANEOUS SYRINGE: SUBCUTANEOUS | 28 days supply | Qty: 2 | Fill #8

## 2021-12-08 MED FILL — XOLAIR 75 MG/0.5 ML SUBCUTANEOUS SYRINGE: SUBCUTANEOUS | 28 days supply | Qty: 1 | Fill #8

## 2021-12-12 LAB — VITAMIN D 25 HYDROXY: VITAMIN D, TOTAL (25OH): 27.9 ng/mL (ref 20.0–80.0)

## 2021-12-21 ENCOUNTER — Ambulatory Visit: Admit: 2021-12-21 | Discharge: 2021-12-22 | Payer: PRIVATE HEALTH INSURANCE

## 2021-12-25 DIAGNOSIS — M25552 Pain in left hip: Principal | ICD-10-CM

## 2021-12-25 MED ORDER — TIZANIDINE 4 MG TABLET
ORAL_TABLET | Freq: Three times a day (TID) | ORAL | 0 refills | 5 days | Status: CP | PRN
Start: 2021-12-25 — End: 2021-12-30

## 2021-12-26 NOTE — Unmapped (Signed)
Contacted regarding imaging results. She confirmed her birthday before starting.   Briefly discussed MRI results.     Discussed was declined by insurance but was unsure why it was declined by insurance.  Chart reviewed and she saw orthopedics on 06/09/21 with the following plan:   Plan   1. I discussed the physical exam finding as well as her x-ray results and previous treatment. I discussed the nature of her back and leg pain.  2. The patient was placed on methylprednisolone 8 mg tapering dose for 10 days.  3. The patient will stay on the Flexeril at nighttime.  4. She was given tramadol for breakthrough pain.  5. She was sent to physical therapy for lumbar rehab.  6. The patient will follow-up in 3 weeks for reevaluation of her lower back. At one point we will need to get an MRI of the lumbar spine.     She has previously seen PT, last saw in May (06/21/2021, 5/17/23023).  She noted she had previously done PT for 6 months before this in the past, going at least 2 days a week. She is not able to tell me the dates in particular but would research back for me.  Her pain would only mildly improve but would not get better which is why we pursued obtaining an MRI given continued pain and Orthopedic plan for MRI.     She endorses doing exercises at home at least 3 days a week for more than the last 6 months and pain continues to persist. I have reached out to Utilization RN for assistance.    She has tried multiple medications including: prednisone, flexeril, tramadol, gabapentin, Oral NSAIDs, Tylenol. I have sent in Rx of Tizanidine for pain. Advised to only take at night however as could make her drowsy. To keep appointment on 01/08/2022 and will gather more specific history

## 2022-01-01 MED ORDER — EMPTY CONTAINER
0 refills | 0 days
Start: 2022-01-01 — End: ?

## 2022-01-01 NOTE — Unmapped (Signed)
[facility] Refill Coordination Note    Specialty Medication(s) to be Shipped:   CF/Pulmonary/Asthma: Xolair 150 AND 75MG /ML    Other medication(s) to be shipped:  SHARPS CONTAINER     Jamie Webb, DOB: 12/18/1990  Phone: [phone] (home)       All above HIPAA information was verified with patient.     Was a Nurse, learning disability used for this call? No    Completed refill call assessment today to schedule patient's medication shipment from the [facility] [phone]).  All relevant notes have been reviewed.     Specialty medication(s) and dose(s) confirmed: Regimen is correct and unchanged.   Changes to medications: [name] reports no changes at this time.  Changes to insurance: No  New side effects reported not previously addressed with a pharmacist or physician: None reported  Questions for the pharmacist: No    Confirmed patient received a Conservation officer, historic buildings and a Surveyor, mining with first shipment. The patient will receive a drug information handout for each medication shipped and additional FDA Medication Guides as required.       DISEASE/MEDICATION-SPECIFIC INFORMATION        For patients on injectable medications: Patient currently has 0 doses left.  Next injection is scheduled for 11/27.    SPECIALTY MEDICATION ADHERENCE     Medication Adherence    Patient reported X missed doses in the last month: 0  Specialty Medication: XOLAIR 150 mg/mL syringe (omalizumab)  Patient is on additional specialty medications: Yes  Additional Specialty Medications: XOLAIR 75 mg/0.5 mL syringe (omalizumab)  Patient Reported Additional Medication X Missed Doses in the Last Month: 0  Patient is on more than two specialty medications: No                                Were doses missed due to medication being on hold? No    Xolair 150 mg/ml: 0 days of medicine on hand   Xolair 75 mg/ml: 0 days of medicine on hand        REFERRAL TO PHARMACIST     Referral to the pharmacist: Not needed      [facility]     Shipping address confirmed in Epic.     Delivery Scheduled: Yes, Expected medication delivery date: 01/03/22.     Medication will be delivered via UPS to the prescription address in Epic WAM.    Unk Lightning   [facility] Specialty Technician

## 2022-01-03 MED ORDER — FLUOXETINE 20 MG CAPSULE
ORAL_CAPSULE | Freq: Every day | ORAL | 0 refills | 90 days | Status: CP
Start: 2022-01-03 — End: 2022-04-03

## 2022-01-03 MED FILL — XOLAIR 75 MG/0.5 ML SUBCUTANEOUS SYRINGE: SUBCUTANEOUS | 28 days supply | Qty: 1 | Fill #9

## 2022-01-03 MED FILL — XOLAIR 150 MG/ML SUBCUTANEOUS SYRINGE: SUBCUTANEOUS | 28 days supply | Qty: 2 | Fill #9

## 2022-01-03 MED FILL — EMPTY CONTAINER: 120 days supply | Qty: 1 | Fill #0

## 2022-01-03 NOTE — Unmapped (Signed)
Pharmacy contacted the clinic on behalf of the patient. Pharmacy verified. Jamie Webb

## 2022-01-05 MED ORDER — DEXTROAMPHETAMINE-AMPHETAMINE 20 MG TABLET
ORAL_TABLET | Freq: Every day | ORAL | 0 refills | 30.00000 days | Status: CP
Start: 2022-01-05 — End: 2021-11-06

## 2022-01-06 NOTE — Unmapped (Signed)
Jamie Webb  16-Mar-1990    Assessment & Plan:  Lumbosacral radiculopathy at S1    Chronic left-sided low back pain with left-sided sciatica    Excessive sweating    Constipation, unspecified constipation type    Abdominal pain, unspecified abdominal location    Restless leg syndrome      ------------------------------------------------------------  31 yo female presents for follow up    Lumbosacral Radiculopathy at S1, Left hip pain, RLS  -She has had at least 6 months of continued lumbosacral radiculopathy as well as left hip pain that has very minimally improved the last 6 months.   -She has been doing home physical therapy exercises since seeing Physical Therapy in May, 2023 (It has been 6 months of performing physical therapy exercises at home. Given conflicts/stressors at home, she is unable to make it PT outpatient and therefore performs medications at home).   -She has tried MULTIPLE PO medications including: Ibuprofen, Tylenol, Tramadol, Multiple different Muscle relaxants, Steroid packs, Gabapentin   -Given her pain continued to persist despite 6 months of conservative therapies, MRI of her low back as well as left hip was obtained to further assess what might be causing her symptoms.   -These results were discussed in depth with her at our visit.   -She is not having bowel/bladder incontinence or saddle anesthesia.   -At this time, she will continue to perform PT Exercises at home; she deferred seeing orthopedic surgery/outpatient PT at this time due to stressors at home  -Medications: Okay to continue Gabapentin 300mg  at bedtime PRN for now. If symptoms continue to persist, will discuss increasing. For acute flares, she notes that Flexiril works best for decreasing her pain (however, her pain is never completely resolved)       Excessive Sweating  -Reports chronic excessive sweating (non-specific)   -She has not had fever, chills, night sweats, weight loss. She does have fatigue. She has a low likelyhood of having infection at this time and she appears to be low risk for TB at this time. Her TSH, CBC were WNL and she was HIV and HEpatitis C negative.   -Her symptoms could be due to her medication use as she is on SSRI, Adderall and Lisdexamfetamine  -She did not appear diaphoretic on our exam  -Will offer CXR (to rule out possible TB) and can obtain inflammatory markers as well as ANA  -In regards to treatment, can offer Antiperspirants (Aluminum chloride Hexahydrate 20% or 6.25%) but can also offer Topical Glycopyrronium (this is mainly for axillary region)      Abdominal Pain, Constipation  -Noted chronic intermittent constipation and abdominal pain since her c-section (which was 9 years ago). On exam, she has a non-acute abdomen. She has no red flag signs per her history (such as weight loss, fever, etc). She reported occasional blood in her stool but she declined examination of anus (she believes is from hemorrhoids/constipation)  -We discussed different causes of her abdominal pain and constipation including possible adhesions from previous C-section.   -Abdominal XR was offered to reassess for constipation/obstruction but deferred at this time.   -Advised daily use of bowel regimen (such as miralax) rather than PRN use and to increase water intake as well. Will continue to monitor but discussed to contact me if pain gets worse and will obtain further imaging.       Return in about 3 months (around 04/10/2022) for f/u constipation, back/hip pain, sweating.    Subjective:  Subjective    HPI:  Jamie Webb is a 31 y.o. female here for follow up.   She has had chronic low back pain and left hip pain as it has been occurring for >3 months.     She had her initial visit with Orthopedics (Dr. Viviano Simas) on 06/09/2021    She was referred to Physical Therapy Rosezella [place]).     She underwent Physical Therapy on 5/10, 5/17, 5/19, 5/25.   Due to family obligations, she was unable to continue with PT visits but has continued to do the PT exercises at home at least twice a week since May of this year.   Medications she has tried: Ibuprofen, Tylenol, Steroid, Tramadol, different muscle relaxant and Gabapentin       The MRI revealed    MRI lumbar spine without contrast:   -Mild straightening of normal lumbar lordosis. Mild disc desiccation of L5-S1.     L4-L5: Posterior disc bulge, ligamentum flavum hypertrophy, and facet arthropathy. No significant spinal canal narrowing. Mild bilateral neuroforaminal narrowing.      L5-S1: Small central disc protrusion superimposed on posterior disc bulge, ligamentum flavum hypertrophy, and facet arthropathy. No significant spinal canal narrowing. Mild bilateral neuroforaminal narrowing. Contact of disc on the exiting left L5 nerve root.     MRI of Left hip   There is mild sclerosis of the right sacrum adjacent to the sacroiliac joint but there is no adjacent marrow edema, erosion, or effusion.      Small field-of-view images of the left hip: Smooth defect at the posterior acetabular chondral labral junction, favor a small normal variant sulcus. Articular cartilage is intact. No joint effusion.      There is mild gluteus minimus and medius tendinosis bilaterally. Trace edema within the distal left iliacus as well as in the left proximal adductors. Hamstring tendon origins are normal.  No evidence of bursitis.      Right hip joint is approximated on the large field-of-view images.      The sciatic nerves are normal in course and contour.      Trace free fluid in the pelvis, likely physiologic. Small intramural fibroid along the posterior uterine body.         Orthopedic note on 06/30/2021:   Plan   1. I discussed the physical exam finding as well as her x-ray results and previous treatment. I discussed the nature of her back and leg pain.  2. The patient was placed on methylprednisolone 8 mg tapering dose for 10 days.  3. The patient will stay on the Flexeril at nighttime.  4. She was given tramadol for breakthrough pain.  5. She was sent to physical therapy for lumbar rehab.  6. The patient will follow-up in 3 weeks for reevaluation of her lower back. At one point we will need to get an MRI of the lumbar spine.    This note was generated in part with voice recognition software and I apologize for any typographical errors that were not detected and corrected.  Edilia Bo PA-C, M.S.       CONCERNS FOR TODAY   -Excessive sweating  -still smoking (variable amount)   -No bowel/bladder incontinence   -hard abdomen after c-section (9 years)  -Reports constipation   -previously had blood in stool but thought to be due to constipation/hemorrhoids  -anxiety is better but noted that her daughter did have to go to the hospital due to [facility] and had plan.   -She noted the Prozac has helped her as  well.     ROS:  Review of systems negative unless otherwise noted as per HPI.    Objective:  Objective    Vitals:    01/08/22 0955   BP: 110/80   Pulse: 86   Temp: 37.7 ??C (99.8 ??F)   SpO2: 98%     Body mass index is 30.18 kg/m??.    Physical Exam:  Const: in no acute distress  HEENT: normocephalic, atraumatic. No nasal congestion on exam  Cardiac: RRR  Pulm: Decreased air movement noted diffusely but no cough/wheeze/rhonchi on exam  Abdomen: Abdomen is soft, not distended. Bowel sounds are present. No tympany on percussion. There is mild tenderness in mid abdomen but no rebound tenderness or guarding noted. Murphy sign and McBurney's point negative. Psoas sign negative.   GU: declined examination for hemorrhoids or anal fissuers  MSK: no difficulty ambulating at this time. Appeared to have a little pain when transitioning to the exam table. Straight leg test positive on left. Gait is stable  Psych: very pleasant   Neuro: Aox3, no neurodeficits on exam

## 2022-01-08 ENCOUNTER — Ambulatory Visit
Admit: 2022-01-08 | Discharge: 2022-01-09 | Payer: PRIVATE HEALTH INSURANCE | Attending: Student in an Organized Health Care Education/Training Program | Primary: Student in an Organized Health Care Education/Training Program

## 2022-01-08 DIAGNOSIS — R61 Generalized hyperhidrosis: Principal | ICD-10-CM

## 2022-01-08 DIAGNOSIS — M5442 Lumbago with sciatica, left side: Principal | ICD-10-CM

## 2022-01-08 DIAGNOSIS — M5417 Radiculopathy, lumbosacral region: Principal | ICD-10-CM

## 2022-01-08 DIAGNOSIS — G8929 Other chronic pain: Principal | ICD-10-CM

## 2022-01-08 DIAGNOSIS — K5900 Constipation, unspecified: Principal | ICD-10-CM

## 2022-01-08 MED ORDER — LISDEXAMFETAMINE 60 MG CAPSULE
ORAL_CAPSULE | Freq: Every morning | ORAL | 0 refills | 30 days | Status: CP
Start: 2022-01-08 — End: ?

## 2022-01-08 NOTE — Unmapped (Signed)
Patient called the office this morning stating that her Vyvanse is on back order at her regular pharmacy. She is requesting that her rx be sent to the [facility] pharmacy on [address]. She states that she is completely out.

## 2022-01-22 NOTE — Unmapped (Signed)
Attempted to contact [employer] to appeal denial to MRI but have been unsuccessful multiple tries.   Letter to be sent with last office visit and fax to [phone].

## 2022-01-24 NOTE — Unmapped (Signed)
Contacted patient's insurance and [employer] regarding status on APPEAL process for denial of imaging. They have not received documentation yet.   Will re-fax to [phone] (confirmed this is the fax number for [employer]) as well as fax to [phone] (as provided by medicaid).

## 2022-01-29 ENCOUNTER — Telehealth
Admit: 2022-01-29 | Discharge: 2022-01-30 | Payer: PRIVATE HEALTH INSURANCE | Attending: Psychiatric/Mental Health | Primary: Psychiatric/Mental Health

## 2022-01-29 DIAGNOSIS — F902 Attention-deficit hyperactivity disorder, combined type: Principal | ICD-10-CM

## 2022-01-29 DIAGNOSIS — F3340 Major depressive disorder, recurrent, in remission, unspecified: Principal | ICD-10-CM

## 2022-01-29 MED ORDER — FLUOXETINE 40 MG CAPSULE
ORAL_CAPSULE | Freq: Every day | ORAL | 0 refills | 90 days | Status: CP
Start: 2022-01-29 — End: 2022-04-29

## 2022-01-29 MED ORDER — LISDEXAMFETAMINE 60 MG CAPSULE
ORAL_CAPSULE | Freq: Every morning | ORAL | 0 refills | 30 days | Status: CP
Start: 2022-01-29 — End: 2022-02-27

## 2022-01-29 MED ORDER — DEXTROAMPHETAMINE-AMPHETAMINE 20 MG TABLET
ORAL_TABLET | Freq: Every day | ORAL | 0 refills | 29 days | Status: CP
Start: 2022-01-29 — End: 2022-02-27
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
## 2022-01-31 NOTE — Unmapped (Signed)
[facility] Refill Coordination Note    Specialty Medication(s) to be Shipped:   CF/Pulmonary/Asthma: Xolair 150mg /ml  Xolair 75mg /0.5ml    Other medication(s) to be shipped:  Epinephrine 0.3 mg/0.82ml     [name], DOB: [birth year]  Phone: [phone] (home)       All above HIPAA information was verified with patient.     Was a Nurse, learning disability used for this call? No    Completed refill call assessment today to schedule patient's medication shipment from the [facility] [phone]).  All relevant notes have been reviewed.     Specialty medication(s) and dose(s) confirmed: Regimen is correct and unchanged.   Changes to medications: [name] reports no changes at this time.  Changes to insurance: No  New side effects reported not previously addressed with a pharmacist or physician: None reported  Questions for the pharmacist: No    Confirmed patient received a Conservation officer, historic buildings and a Surveyor, mining with first shipment. The patient will receive a drug information handout for each medication shipped and additional FDA Medication Guides as required.       DISEASE/MEDICATION-SPECIFIC INFORMATION        For patients on injectable medications: Patient currently has 0 doses left.  Next injection is scheduled for 02/05/22.    SPECIALTY MEDICATION ADHERENCE     Medication Adherence    Patient reported X missed doses in the last month: 0  Specialty Medication: XOLAIR 150 mg/mL syringe (omalizumab)  Patient is on additional specialty medications: Yes  Additional Specialty Medications: XOLAIR 75 mg/0.5 mL syringe (omalizumab)  Patient Reported Additional Medication X Missed Doses in the Last Month: 0  Patient is on more than two specialty medications: No  Any gaps in refill history greater than 2 weeks in the last 3 months: no  Demonstrates understanding of importance of adherence: no  Informant: patient  Reliability of informant: reliable  Provider-estimated medication adherence level: good  Patient is at risk for Non-Adherence: No  Reasons for non-adherence: no problems identified                                Were doses missed due to medication being on hold? No    XOLAIR 150 mg/mL syringe (omalizumab)  : 0 days of medicine on hand   XOLAIR 75 mg/0.5 mL syringe (omalizumab)  : 0 days of medicine on hand       REFERRAL TO PHARMACIST     Referral to the pharmacist: Not needed      [facility]     Shipping address confirmed in Epic.     Delivery Scheduled: Yes, Expected medication delivery date: 02/02/22.     Medication will be delivered via Same Day Courier to the prescription address in Epic WAM.    Shruti Arrey' W Wilhemena Durie Shared [facility] Specialty Technician

## 2022-02-02 MED FILL — XOLAIR 75 MG/0.5 ML SUBCUTANEOUS SYRINGE: SUBCUTANEOUS | 28 days supply | Qty: 1 | Fill #10

## 2022-02-02 MED FILL — EPINEPHRINE 0.3 MG/0.3 ML INJECTION, AUTO-INJECTOR: INTRAMUSCULAR | 2 days supply | Qty: 2 | Fill #0

## 2022-02-02 MED FILL — XOLAIR 150 MG/ML SUBCUTANEOUS SYRINGE: SUBCUTANEOUS | 28 days supply | Qty: 2 | Fill #10

## 2022-02-08 NOTE — Unmapped (Signed)
Outreach call. Clinician contacted patient to schedule initial therapy session. Patient did not answer and voicemail is full. Clinician left return number and will send a MyChart message.

## 2022-02-27 NOTE — Unmapped (Signed)
Spoke with pt and she will stop by tomorrow to fill out form to be faxed over to her insurance company

## 2022-02-28 ENCOUNTER — Telehealth: Admit: 2022-02-28 | Discharge: 2022-03-01 | Payer: PRIVATE HEALTH INSURANCE | Attending: School | Primary: School

## 2022-02-28 MED ORDER — DEXTROAMPHETAMINE-AMPHETAMINE 20 MG TABLET
ORAL_TABLET | Freq: Every day | ORAL | 0 refills | 29 days | Status: CP
Start: 2022-02-28 — End: 2022-03-29

## 2022-02-28 MED ORDER — LISDEXAMFETAMINE 60 MG CAPSULE
ORAL_CAPSULE | Freq: Every morning | ORAL | 0 refills | 30 days | Status: CP
Start: 2022-02-28 — End: 2022-03-29

## 2022-02-28 NOTE — Unmapped (Signed)
[facility] Refill Coordination Note    Specialty Medication(s) to be Shipped:   CF/Pulmonary/Asthma: Xolair 150mg /ml  Xolair 75mg /0.55ml    Other medication(s) to be shipped: No additional medications requested for fill at this time     Jamie Webb, DOB: 06-17-1990  Phone: [phone] (home)       All above HIPAA information was verified with patient.     Was a Nurse, learning disability used for this call? No    Completed refill call assessment today to schedule patient's medication shipment from the [facility] [phone]).  All relevant notes have been reviewed.     Specialty medication(s) and dose(s) confirmed: Regimen is correct and unchanged.   Changes to medications: Jamie Webb reports no changes at this time.  Changes to insurance: No  New side effects reported not previously addressed with a pharmacist or physician: None reported  Questions for the pharmacist: No    Confirmed patient received a Conservation officer, historic buildings and a Surveyor, mining with first shipment. The patient will receive a drug information handout for each medication shipped and additional FDA Medication Guides as required.       DISEASE/MEDICATION-SPECIFIC INFORMATION        For patients on injectable medications: Patient currently has 0 doses left.  Next injection is scheduled for 03/05/22.    SPECIALTY MEDICATION ADHERENCE     Medication Adherence    Patient reported X missed doses in the last month: 0  Specialty Medication: XOLAIR 150 mg/mL syringe (omalizumab)  Patient is on additional specialty medications: Yes  Additional Specialty Medications: XOLAIR 75 mg/0.5 mL syringe (omalizumab)  Patient Reported Additional Medication X Missed Doses in the Last Month: 0  Patient is on more than two specialty medications: No  Any gaps in refill history greater than 2 weeks in the last 3 months: no  Demonstrates understanding of importance of adherence: yes  Informant: patient  Reliability of informant: reliable  Provider-estimated medication adherence level: good  Patient is at risk for Non-Adherence: No  Reasons for non-adherence: no problems identified                                Were doses missed due to medication being on hold? No    XOLAIR 150 mg/mL syringe (omalizumab)  : 0 days of medicine on hand   XOLAIR 75 mg/0.5 mL syringe (omalizumab)  : 0 days of medicine on hand       REFERRAL TO PHARMACIST     Referral to the pharmacist: Not needed      [facility]     Shipping address confirmed in Epic.     Delivery Scheduled: Yes, Expected medication delivery date: 03/02/22.     Medication will be delivered via Same Day Courier to the prescription address in Epic WAM.    Leonna Schlee' W Danae Chen Shared [facility] Specialty Technician
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
## 2022-03-02 MED FILL — XOLAIR 75 MG/0.5 ML SUBCUTANEOUS SYRINGE: SUBCUTANEOUS | 28 days supply | Qty: 1 | Fill #11

## 2022-03-02 MED FILL — XOLAIR 150 MG/ML SUBCUTANEOUS SYRINGE: SUBCUTANEOUS | 28 days supply | Qty: 2 | Fill #11

## 2022-03-12 ENCOUNTER — Telehealth: Admit: 2022-03-12 | Discharge: 2022-03-13 | Payer: PRIVATE HEALTH INSURANCE | Attending: School | Primary: School

## 2022-03-12 DIAGNOSIS — F909 Attention-deficit hyperactivity disorder, unspecified type: Principal | ICD-10-CM

## 2022-03-12 DIAGNOSIS — F3340 Major depressive disorder, recurrent, in remission, unspecified: Principal | ICD-10-CM

## 2022-03-12 DIAGNOSIS — F419 Anxiety disorder, unspecified: Principal | ICD-10-CM

## 2022-03-12 DIAGNOSIS — F32A Depression, unspecified depression type: Principal | ICD-10-CM
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
## 2022-03-21 ENCOUNTER — Telehealth: Admit: 2022-03-21 | Discharge: 2022-03-22 | Payer: PRIVATE HEALTH INSURANCE | Attending: School | Primary: School

## 2022-03-21 DIAGNOSIS — F3340 Major depressive disorder, recurrent, in remission, unspecified: Principal | ICD-10-CM

## 2022-03-21 DIAGNOSIS — F909 Attention-deficit hyperactivity disorder, unspecified type: Principal | ICD-10-CM

## 2022-03-21 DIAGNOSIS — F32A Depression, unspecified depression type: Principal | ICD-10-CM

## 2022-03-21 DIAGNOSIS — F419 Anxiety disorder, unspecified: Principal | ICD-10-CM
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
## 2022-03-28 MED ORDER — FLUOXETINE 20 MG CAPSULE
ORAL_CAPSULE | Freq: Every day | ORAL | 0 refills | 90 days
Start: 2022-03-28 — End: ?

## 2022-03-28 NOTE — Unmapped (Signed)
Pharmacy contacted the clinic on behalf of the patient. Pharmacy verified. Jamie Webb

## 2022-03-28 NOTE — Unmapped (Signed)
[facility] Refill Coordination Note    Specialty Medication(s) to be Shipped:   CF/Pulmonary/Asthma: Xolair 150mg /ml  Xolair 75mg /0.13ml    Other medication(s) to be shipped: No additional medications requested for fill at this time     Jamie Webb, DOB: 1990-09-17  Phone: [phone] (home)       All above HIPAA information was verified with patient.     Was a Nurse, learning disability used for this call? No    Completed refill call assessment today to schedule patient's medication shipment from the [facility] [phone]).  All relevant notes have been reviewed.     Specialty medication(s) and dose(s) confirmed: Regimen is correct and unchanged.   Changes to medications: Jamie Webb reports no changes at this time.  Changes to insurance: No  New side effects reported not previously addressed with a pharmacist or physician: None reported  Questions for the pharmacist: No    Confirmed patient received a Conservation officer, historic buildings and a Surveyor, mining with first shipment. The patient will receive a drug information handout for each medication shipped and additional FDA Medication Guides as required.       DISEASE/MEDICATION-SPECIFIC INFORMATION        For patients on injectable medications: Patient currently has 0 doses left.  Next injection is scheduled for 04/02/22.    SPECIALTY MEDICATION ADHERENCE     Medication Adherence    Specialty Medication: XOLAIR 150 mg/mL syringe (omalizumab)  Patient is on additional specialty medications: Yes  Additional Specialty Medications: XOLAIR 75 mg/0.5 mL syringe (omalizumab)              Were doses missed due to medication being on hold? No    XOLAIR 150 mg/mL syringe (omalizumab)  : 0 days of medicine on hand   XOLAIR 75 mg/0.5 mL syringe (omalizumab) : 0 days of medicine on hand         REFERRAL TO PHARMACIST     Referral to the pharmacist: Not needed      [facility]     Shipping address confirmed in Epic.     Patient was notified of new phone menu : No    Delivery Scheduled: Yes, Expected medication delivery date: 03/30/22.     Medication will be delivered via Same Day Courier to the prescription address in Epic WAM.    Jamie Webb' W Jamie Webb Shared [facility] Specialty Technician

## 2022-03-30 MED ORDER — LISDEXAMFETAMINE 60 MG CAPSULE
ORAL_CAPSULE | Freq: Every morning | ORAL | 0 refills | 30 days | Status: CP
Start: 2022-03-30 — End: 2022-04-28

## 2022-03-30 MED ORDER — DEXTROAMPHETAMINE-AMPHETAMINE 20 MG TABLET
ORAL_TABLET | Freq: Every day | ORAL | 0 refills | 29 days | Status: CP
Start: 2022-03-30 — End: 2022-04-28

## 2022-03-30 MED FILL — XOLAIR 75 MG/0.5 ML SUBCUTANEOUS SYRINGE: SUBCUTANEOUS | 28 days supply | Qty: 1 | Fill #12

## 2022-03-30 MED FILL — XOLAIR 150 MG/ML SUBCUTANEOUS SYRINGE: SUBCUTANEOUS | 28 days supply | Qty: 2 | Fill #12

## 2022-04-04 ENCOUNTER — Telehealth: Admit: 2022-04-04 | Discharge: 2022-04-05 | Payer: PRIVATE HEALTH INSURANCE | Attending: School | Primary: School
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
## 2022-04-15 DIAGNOSIS — J4551 Severe persistent asthma with (acute) exacerbation: Principal | ICD-10-CM

## 2022-04-15 DIAGNOSIS — J45909 Unspecified asthma, uncomplicated: Principal | ICD-10-CM

## 2022-04-15 MED ORDER — XOLAIR 150 MG/ML SUBCUTANEOUS SYRINGE
SUBCUTANEOUS | 12 refills | 28 days
Start: 2022-04-15 — End: 2022-04-30

## 2022-04-15 MED ORDER — XOLAIR 75 MG/0.5 ML SUBCUTANEOUS SYRINGE
SUBCUTANEOUS | 12 refills | 28 days
Start: 2022-04-15 — End: 2022-04-30

## 2022-04-16 DIAGNOSIS — J4551 Severe persistent asthma with (acute) exacerbation: Principal | ICD-10-CM

## 2022-04-16 MED ORDER — XOLAIR 75 MG/0.5 ML SUBCUTANEOUS SYRINGE
SUBCUTANEOUS | 4 refills | 28 days | Status: CP
Start: 2022-04-16 — End: ?

## 2022-04-16 MED ORDER — XOLAIR 150 MG/ML SUBCUTANEOUS SYRINGE
SUBCUTANEOUS | 4 refills | 28 days | Status: CP
Start: 2022-04-16 — End: ?

## 2022-04-16 NOTE — Unmapped (Signed)
This patient has not been seen in pulm clinic for >2 years. She needs an appointment with [place] prior to xolair refill. Clinic schedulers contacted and patient given phone number to call our clinic.

## 2022-04-18 ENCOUNTER — Telehealth: Admit: 2022-04-18 | Discharge: 2022-04-19 | Payer: PRIVATE HEALTH INSURANCE | Attending: School | Primary: School

## 2022-04-18 DIAGNOSIS — F419 Anxiety disorder, unspecified: Principal | ICD-10-CM

## 2022-04-18 DIAGNOSIS — F32A Anxiety and depression: Principal | ICD-10-CM

## 2022-04-18 DIAGNOSIS — F909 Attention-deficit hyperactivity disorder, unspecified type: Principal | ICD-10-CM

## 2022-04-18 DIAGNOSIS — F3340 Major depressive disorder, recurrent, in remission, unspecified: Principal | ICD-10-CM
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
## 2022-04-23 ENCOUNTER — Telehealth
Admit: 2022-04-23 | Discharge: 2022-04-24 | Payer: PRIVATE HEALTH INSURANCE | Attending: Psychiatric/Mental Health | Primary: Psychiatric/Mental Health

## 2022-04-23 DIAGNOSIS — F429 Obsessive-compulsive disorder, unspecified: Principal | ICD-10-CM

## 2022-04-23 DIAGNOSIS — F909 Attention-deficit hyperactivity disorder, unspecified type: Principal | ICD-10-CM

## 2022-04-23 MED ORDER — FLUOXETINE 40 MG CAPSULE
ORAL_CAPSULE | Freq: Every day | ORAL | 0 refills | 90 days | Status: CP
Start: 2022-04-23 — End: 2022-07-22

## 2022-04-23 MED ORDER — DEXTROAMPHETAMINE-AMPHETAMINE 20 MG TABLET
ORAL_TABLET | Freq: Every day | ORAL | 0 refills | 30 days | Status: CP
Start: 2022-04-23 — End: 2022-05-23

## 2022-04-23 MED ORDER — LISDEXAMFETAMINE 60 MG CAPSULE
ORAL_CAPSULE | Freq: Every morning | ORAL | 0 refills | 30 days | Status: CP
Start: 2022-04-23 — End: 2022-05-23
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
## 2022-04-25 NOTE — Unmapped (Signed)
[facility] Refill Coordination Note    Specialty Medication(s) to be Shipped:   CF/Pulmonary/Asthma: Xolair 150mg /ml  Xolair 75mg /0.81ml    Other medication(s) to be shipped: No additional medications requested for fill at this time     Jamie Webb, DOB: Aug 31, 1990  Phone: [phone] (home)       All above HIPAA information was verified with patient.     Was a Nurse, learning disability used for this call? No    Completed refill call assessment today to schedule patient's medication shipment from the [facility] [phone]).  All relevant notes have been reviewed.     Specialty medication(s) and dose(s) confirmed: Regimen is correct and unchanged.   Changes to medications: [name] reports no changes at this time.  Changes to insurance: No  New side effects reported not previously addressed with a pharmacist or physician: None reported  Questions for the pharmacist: No    Confirmed patient received a Conservation officer, historic buildings and a Surveyor, mining with first shipment. The patient will receive a drug information handout for each medication shipped and additional FDA Medication Guides as required.       DISEASE/MEDICATION-SPECIFIC INFORMATION        For patients on injectable medications: Patient currently has 0 doses left.  Next injection is scheduled for 04/30/22.    SPECIALTY MEDICATION ADHERENCE     Medication Adherence    Patient reported X missed doses in the last month: 0  Specialty Medication: XOLAIR 150 mg/mL syringe (omalizumab)  Patient is on additional specialty medications: Yes  Additional Specialty Medications: XOLAIR 75 mg/0.5 mL syringe (omalizumab)  Patient Reported Additional Medication X Missed Doses in the Last Month: 0  Patient is on more than two specialty medications: No  Any gaps in refill history greater than 2 weeks in the last 3 months: no  Demonstrates understanding of importance of adherence: yes  Informant: patient  Reliability of informant: reliable  Provider-estimated medication adherence level: good  Patient is at risk for Non-Adherence: No  Reasons for non-adherence: no problems identified              Were doses missed due to medication being on hold? No    XOLAIR 150 mg/mL syringe (omalizumab) : 0 days of medicine on hand   XOLAIR 75 mg/0.5 mL syringe (omalizumab)  : 0 days of medicine on hand       REFERRAL TO PHARMACIST     Referral to the pharmacist: Not needed      [facility]     Shipping address confirmed in Epic.     Patient was notified of new phone menu : No    Delivery Scheduled: Yes, Expected medication delivery date: 04/27/22.     Medication will be delivered via Same Day Courier to the prescription address in Epic WAM.    Bronwen Pendergraft' W Danae Chen Shared [facility] Specialty Technician

## 2022-04-26 ENCOUNTER — Ambulatory Visit
Admit: 2022-04-26 | Payer: PRIVATE HEALTH INSURANCE | Attending: Student in an Organized Health Care Education/Training Program | Primary: Student in an Organized Health Care Education/Training Program

## 2022-04-26 DIAGNOSIS — J4551 Severe persistent asthma with (acute) exacerbation: Principal | ICD-10-CM

## 2022-04-27 NOTE — Unmapped (Signed)
[name] 's Xolair shipment will be canceled  as a result of prior authorization being required by the patient's insurance.     I have reached out to the patient  at [phone] and communicated the delay. We will not reschedule the medication and have removed this/these medication(s) from the work request.  We have canceled this work request.     Provider asked to hold off on submitting the PA since the patient has not been seen in over 2 years. The patient has an appointment in June.

## 2022-04-30 ENCOUNTER — Ambulatory Visit
Admit: 2022-04-30 | Discharge: 2022-05-01 | Payer: PRIVATE HEALTH INSURANCE | Attending: Student in an Organized Health Care Education/Training Program | Primary: Student in an Organized Health Care Education/Training Program

## 2022-04-30 ENCOUNTER — Ambulatory Visit: Admit: 2022-04-30 | Discharge: 2022-05-01 | Payer: PRIVATE HEALTH INSURANCE

## 2022-04-30 DIAGNOSIS — J4551 Severe persistent asthma with (acute) exacerbation: Principal | ICD-10-CM

## 2022-04-30 DIAGNOSIS — F17200 Nicotine dependence, unspecified, uncomplicated: Principal | ICD-10-CM

## 2022-04-30 DIAGNOSIS — R0683 Snoring: Principal | ICD-10-CM

## 2022-04-30 MED ORDER — XOLAIR 75 MG/0.5 ML SUBCUTANEOUS SYRINGE
SUBCUTANEOUS | 4 refills | 28 days | Status: CP
Start: 2022-04-30 — End: ?
  Filled 2022-05-01: qty 1, 28d supply, fill #0

## 2022-04-30 MED ORDER — BUDESONIDE-FORMOTEROL HFA 160 MCG-4.5 MCG/ACTUATION AEROSOL INHALER
Freq: Two times a day (BID) | RESPIRATORY_TRACT | 0 refills | 31 days | Status: CP
Start: 2022-04-30 — End: 2023-04-30

## 2022-04-30 MED ORDER — XOLAIR 150 MG/ML SUBCUTANEOUS SYRINGE
SUBCUTANEOUS | 4 refills | 28 days | Status: CP
Start: 2022-04-30 — End: ?
  Filled 2022-05-01: qty 2, 28d supply, fill #0

## 2022-04-30 MED ORDER — ALBUTEROL SULFATE HFA 90 MCG/ACTUATION AEROSOL INHALER
Freq: Two times a day (BID) | RESPIRATORY_TRACT | 5 refills | 0 days | Status: CP | PRN
Start: 2022-04-30 — End: ?

## 2022-04-30 MED ORDER — PANTOPRAZOLE 40 MG TABLET,DELAYED RELEASE
ORAL_TABLET | Freq: Every day | ORAL | 1 refills | 30 days | Status: CP
Start: 2022-04-30 — End: 2023-04-30

## 2022-04-30 NOTE — Unmapped (Signed)
[facility]  Pulmonary Clinic - Follow Up Visit    Referring Physician :  Benison Pap Mangel  PCP:     Mangel, Benison Pap, DO  Reason for Consult:   Asthma    ASSESSMENT and PLAN     Problem List  #Asthma, severe persistent, controlled  #Allergic rhinitis  #Tobacco use d/o, current  #likely OSA (BMI 31)  #GERD    History of Present Illness: Ms. Backes is a 32 y.o. female who is seen in consultation at the request of Mangel, Benison Pap, DO for comprehensive evaluation of asthma.    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 Elevated Eos, elevated IgE and multiple allergens. Started on Xolair 03/2020 with significant clinical improvement (no need for maintenance inhalers, minimal PRN use). Did not singulair due to black box warning and patient's mental health history (though not a complete contraindication especially in setting of no prior SI).  - Re-prescribed Xolair (PA sent today)  - Start Symbicort as PRN SMART therapy (has albuterol PRN as well)  - RTC in 3 months with Dr. Regino Schultze (07/18/22)     #Allergic rhinitis  #Sinus issues  - LCV with ENT 05/2020; patient given number to call ENT for possible surgery as previously discussed    #GERD  - Discontinue Pepcid 20mg  BID, given not effective per patient  - Start protonix 40mg  daily  - discussed lifestyle (dietary) changes  - consider GI referral if presistent     #Snoring  #possible OSA  Pertinent positives bolded: Snores loudly, Tired/sleepy during daytime, Observed apneic episodes, Treated for high BP, BMI >35 kg/m2, Age >50yo, Neck circumference >40cm, Gender Female  STOPBANG Total score: 2  - home sleep study re-ordered    #Tobacco use disorder  Patient with h/o 10 pack year smoking history. Down to 4 cig/day from 1ppd. Plans to stop in 11/2022 but feeling very anxious. Contemplative about quitting.   - smoking cessation clinic referral placed    #Vaccinations  - due for COVID -- deferred due to reaction (flu like symptoms)  - UTD on influenza (12/05/21)  - s/p PCV-23, PCV-20 today    Plan of care was discussed with the patient who acknowledged understanding and is in agreement.    Patient will No follow-ups on file. or sooner if needed.    Ms. Reeh was seen, examined and discussed with Dr. Emeline Darling who agrees with the assessment and plan above.     ZO:XWRUEAV Pap Mangel, Mangel, Benison Pap, DO    Nealie Mchatton Griffith Citron, DO  Pulmonary and Critical Care Fellow    HISTORY:      History of Present Illness: Ms. Grody is a 32 y.o. female who is seen for chief complaint of asthma.    The patient has had multiple (4) courses of steroids and antibiotics in 2022. Did not tolerate symbicort in the past and tolerated advair well. Has a persistent chronic cough that is worse in the nighttime and in the mornings. Is fairly active, can walk up stairs (2-3 flights) and can slowly job for 1 mile.     Interval History 04/30/22:  - Patient has been doing extremely well since she was started on the Xolair 2 years ago. States it's been a miracle  - She notices coughing and wheezing usually a couple (2-3) days before her Xolair is due (shot every 2 weeks); this is  the only time she has required albuterol PRN. Otherwise has not required any maintenance inhalers   - States Advair left a strange taste in her mouth.   - Uses her albuterol with a spacer   - Last prednisone course was prior to starting her Xolair 2 years ago (sometime in 2022); Denies any exacerbations or steroid courses for asthma in the last 12 months   - Still endorses acid reflux; which has been worse in the last month. Has been consistent with dietary restriction (except for drinking coffee); avoids tomatoes, spicy foods, chocolate. Usually eats dinner at 7pm, usually lies down around to sleep 10pm-12am. Symptoms are worse with an empty stomach or when she lies down.    Asthma:  Seasonal allergies: yes (zyrtec PRN)  Eczema: no  Childhood asthma: yes (dx 14yo, could not keep up with peers while running)   Aspirin/NSAID sensitivity: no  Triggers: yard work, exposure to allergens (pets, dust, perfumes, cleaning agents), exercise induced  Exacerbations: None in the last 12 months  Nocturnal Symptoms: no  Trigger avoidance: yes  Rescue Inhaler use: albuterol twice a month  Controller inhaler adherance: none    Exposure/Occupational:   Tobacco cigarettes use 1ppd x 10 years; now down to 4 cigarettes/day. Planning to quit 11/2022 when her child turns [age]. Coping mechanism for stress and anxiety.    Denies vaping or illicit drug use  Pets/Animals (Cat, Dog, Birds): have dogs (outdoors), 1 bird (parrot)    Family history:  - No family history of lung diseases, lung cancers, autoimmune conditions   - Father has psoriatic arthritis    Social History:  - Occupation: stay at home mom  - Lives with: significant other, daughters and step kids (5 total children)    OSH records and referral documentation reviewed.     Review of Systems:  A comprehensive review of systems was completed and negative except as noted in HPI.    Relevant Medications: Reviewed    Allergies: Reviewed.    Other History:  The past medical history, surgical history, social history, family history, medications and allergies were personally reviewed and updated in the patient's electronic medical record. Pertinent items are noted above.        PHYSICAL EXAM:      Physical Exam:  Vitals:    04/30/22 1103   BP: 115/69   Pulse: 80   Resp: 18   Temp: 36.7 ??C (98.1 ??F)   SpO2: 99%   Weight: 87.1 kg (192 lb)     Body mass index is 30.99 kg/m??.    General: Obese female in no apparent distress  Eyes: PERRL. Anicteric sclera.   ENT:  Nasal mucosa clear. No polyps seen. No crusting.  Lymph: No cervical or supraclavicular lymphadenopathy.  Respiratory: CTAB, no wheezing, rhonchi or rales  Cardiovascular: RRR, +s1/s2, Normal P2, no RV heave, No MRG, No edema  Abdomen: BS present, Soft, Non-tender, non-distended.  Musculoskeletal/extremities: Normal muscle tone and strength. No cyanosis. No clubbing. No BLE edema  Skin: No skin rashes on clothed exam  Neuro: Alert and oriented x 3. CN grossly intact. No focal neurological deficits         LABORATORY and RADIOLOGY DATA:     Pulmonary Function Test Results:    Date FEV1  (% Pred) FVC   (% Pred) FEV1/FVC DLCO  (% Pred) TLC Desat  Distance   02/10/20 3.19 (93.2) 3.95 (96.8)        04/30/22 3.01 (93.3) 3.71 (96.5) 81%  85.6%        : n/a    Pertinent Laboratory Data:  Reviewed    Pertinent Imaging Data:  Images were personally reviewed with attending.   CXR 06/2015 benign     CXR 02/09/20 at [facility]     Normal lung volumes and mediastinal contours. Visualized tracheal   air column is within normal limits. Both lungs appear clear. No   pneumothorax or pleural effusion. Minimal dextroconvex thoracic   scoliosis. Otherwise no osseous abnormality identified. Negative   visible bowel gas pattern.   IMPRESSION:   Negative.  No cardiopulmonary abnormality.     Reyan Helle Griffith Citron, DO  Pulmonary and Critical Care Fellow

## 2022-04-30 NOTE — Unmapped (Addendum)
Hello [name],    It was great meeting you today. Please see our plan from today below:   We reviewed your severe persistent asthma (allergic phenotype).  - Start using symbicort as needed for your symptoms  - Continue your Omalizumab (we have renewed the prescription for you)  - Please call your ENT physicians at [phone] to set up a follow up appointment  - I have ordered a referral to tobacco cessation clinic for you. Please call [phone] to schedule an appointment with the smoking cessation clinic if you do not hear from them.  - Start protonix 40mg  every day for your acid reflux. Stop your pepcid (famotidine)  - I have ordered a sleep study for you to be completed at home.  - You received the pneumococcal (pneumonia) vaccine today.    Please return to clinic in 3 months with Dr. Regino Schultze (your appointment is on 07/18/2022 1:40 PM).     If you have any non-urgent questions or concerns please reach out by calling the clinic [phone]) or via MyChart.    Brittne Kawasaki Griffith Citron, DO  Pulmonary and Critical Care Fellow

## 2022-05-01 NOTE — Unmapped (Signed)
[name] 's Xolair shipment will be sent out  as a result of prior authorization now approved.     I have reached out to the patient  at [phone] and communicated the delivery change. We will reschedule the medication for the delivery date that the patient agreed upon.  We have confirmed the delivery date as 3/19, via same day courier.

## 2022-05-02 ENCOUNTER — Telehealth: Admit: 2022-05-02 | Discharge: 2022-05-03 | Payer: PRIVATE HEALTH INSURANCE | Attending: School | Primary: School
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
## 2022-05-16 ENCOUNTER — Telehealth: Admit: 2022-05-16 | Discharge: 2022-05-17 | Payer: PRIVATE HEALTH INSURANCE | Attending: School | Primary: School

## 2022-05-16 DIAGNOSIS — F429 Obsessive-compulsive disorder, unspecified: Principal | ICD-10-CM

## 2022-05-16 DIAGNOSIS — F32A Anxiety and depression: Principal | ICD-10-CM

## 2022-05-16 DIAGNOSIS — F419 Anxiety disorder, unspecified: Principal | ICD-10-CM

## 2022-05-16 DIAGNOSIS — F3340 Major depressive disorder, recurrent, in remission, unspecified: Principal | ICD-10-CM

## 2022-05-16 DIAGNOSIS — F909 Attention-deficit hyperactivity disorder, unspecified type: Principal | ICD-10-CM
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
## 2022-05-22 NOTE — Unmapped (Signed)
[facility] Refill Coordination Note    Specialty Medication(s) to be Shipped:   CF/Pulmonary/Asthma: Xolair 150mg /ml  Xolair 75mg /0.66ml    Other medication(s) to be shipped: No additional medications requested for fill at this time     Jamie Webb, DOB: 09/20/1990  Phone: [phone] (home)       All above HIPAA information was verified with patient.     Was a Nurse, learning disability used for this call? No    Completed refill call assessment today to schedule patient's medication shipment from the [facility] [phone]).  All relevant notes have been reviewed.     Specialty medication(s) and dose(s) confirmed: Regimen is correct and unchanged.   Changes to medications: Jamie Webb reports no changes at this time.  Changes to insurance: No  New side effects reported not previously addressed with a pharmacist or physician: None reported  Questions for the pharmacist: No    Confirmed patient received a Conservation officer, historic buildings and a Surveyor, mining with first shipment. The patient will receive a drug information handout for each medication shipped and additional FDA Medication Guides as required.       DISEASE/MEDICATION-SPECIFIC INFORMATION        For patients on injectable medications: Patient currently has 0 doses left.  Next injection is scheduled for 05/28/22.    SPECIALTY MEDICATION ADHERENCE     Medication Adherence    Specialty Medication: XOLAIR 150 mg/mL syringe (omalizumab)  Patient is on additional specialty medications: Yes  Additional Specialty Medications: XOLAIR 75 mg/0.5 mL syringe (omalizumab)              Were doses missed due to medication being on hold? No    XOLAIR 75 mg/0.5 mL syringe (omalizumab)  : 0 days of medicine on hand   XOLAIR 150 mg/mL syringe (omalizumab)  : 0 days of medicine on hand       REFERRAL TO PHARMACIST     Referral to the pharmacist: Not needed      SHIPPING     Shipping address confirmed in Epic.     Patient was notified of new phone menu : No    Delivery Scheduled: Yes, Expected medication delivery date: 05/25/22.     Medication will be delivered via Same Day Courier to the prescription address in Epic WAM.    Jamie Webb' Jamie Webb Shared [facility] Specialty Technician

## 2022-05-23 MED ORDER — DEXTROAMPHETAMINE-AMPHETAMINE 20 MG TABLET
ORAL_TABLET | Freq: Every day | ORAL | 0 refills | 30 days | Status: CP
Start: 2022-05-23 — End: 2022-06-21

## 2022-05-23 MED ORDER — LISDEXAMFETAMINE 60 MG CAPSULE
ORAL_CAPSULE | Freq: Every morning | ORAL | 0 refills | 30 days | Status: CP
Start: 2022-05-23 — End: 2022-06-21

## 2022-05-25 MED FILL — XOLAIR 150 MG/ML SUBCUTANEOUS SYRINGE: SUBCUTANEOUS | 28 days supply | Qty: 2 | Fill #1

## 2022-05-25 MED FILL — XOLAIR 75 MG/0.5 ML SUBCUTANEOUS SYRINGE: SUBCUTANEOUS | 28 days supply | Qty: 1 | Fill #1

## 2022-05-26 MED ORDER — PANTOPRAZOLE 40 MG TABLET,DELAYED RELEASE
ORAL_TABLET | Freq: Every day | ORAL | 1 refills | 0 days
Start: 2022-05-26 — End: ?

## 2022-05-29 MED ORDER — PANTOPRAZOLE 40 MG TABLET,DELAYED RELEASE
ORAL_TABLET | Freq: Every day | ORAL | 1 refills | 90 days | Status: CP
Start: 2022-05-29 — End: ?

## 2022-06-04 ENCOUNTER — Telehealth
Admit: 2022-06-04 | Discharge: 2022-06-05 | Payer: PRIVATE HEALTH INSURANCE | Attending: Psychiatric/Mental Health | Primary: Psychiatric/Mental Health

## 2022-06-04 DIAGNOSIS — F3340 Major depressive disorder, recurrent, in remission, unspecified: Principal | ICD-10-CM

## 2022-06-04 DIAGNOSIS — F909 Attention-deficit hyperactivity disorder, unspecified type: Principal | ICD-10-CM

## 2022-06-04 MED ORDER — FLUOXETINE 20 MG CAPSULE
ORAL_CAPSULE | Freq: Every day | ORAL | 0 refills | 90 days | Status: CP
Start: 2022-06-04 — End: 2022-09-02
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
## 2022-06-13 ENCOUNTER — Telehealth: Admit: 2022-06-13 | Discharge: 2022-06-14 | Payer: PRIVATE HEALTH INSURANCE | Attending: School | Primary: School
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
## 2022-06-19 ENCOUNTER — Telehealth: Admit: 2022-06-19 | Discharge: 2022-06-20 | Payer: PRIVATE HEALTH INSURANCE | Attending: School | Primary: School

## 2022-06-19 DIAGNOSIS — F909 Attention-deficit hyperactivity disorder, unspecified type: Principal | ICD-10-CM

## 2022-06-19 DIAGNOSIS — F3340 Major depressive disorder, recurrent, in remission, unspecified: Principal | ICD-10-CM

## 2022-06-19 DIAGNOSIS — F419 Anxiety disorder, unspecified: Principal | ICD-10-CM

## 2022-06-19 DIAGNOSIS — F32A Anxiety and depression: Principal | ICD-10-CM

## 2022-06-19 DIAGNOSIS — F429 Obsessive-compulsive disorder, unspecified: Principal | ICD-10-CM
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
## 2022-06-19 NOTE — Unmapped (Signed)
[facility] Refill Coordination Note    Specialty Medication(s) to be Shipped:   CF/Pulmonary/Asthma: Xolair 150mg /ml  Xolair 75mg /0.88ml    Other medication(s) to be shipped: No additional medications requested for fill at this time     Jamie Webb, DOB: [birth year]  Phone: [phone] (home)       All above HIPAA information was verified with patient.     Was a Nurse, learning disability used for this call? No    Completed refill call assessment today to schedule patient's medication shipment from the [facility] [phone]).  All relevant notes have been reviewed.     Specialty medication(s) and dose(s) confirmed: Regimen is correct and unchanged.   Changes to medications: [name] reports no changes at this time.  Changes to insurance: No  New side effects reported not previously addressed with a pharmacist or physician: None reported  Questions for the pharmacist: No    Confirmed patient received a Conservation officer, historic buildings and a Surveyor, mining with first shipment. The patient will receive a drug information handout for each medication shipped and additional FDA Medication Guides as required.       DISEASE/MEDICATION-SPECIFIC INFORMATION        For patients on injectable medications: Patient currently has 0 doses left.  Next injection is scheduled for 06/25/22.    SPECIALTY MEDICATION ADHERENCE     Medication Adherence    Patient reported X missed doses in the last month: 0  Specialty Medication: XOLAIR 150 mg/mL syringe (omalizumab)  Patient is on additional specialty medications: Yes  Additional Specialty Medications: XOLAIR 75 mg/0.5 mL syringe (omalizumab)  Patient Reported Additional Medication X Missed Doses in the Last Month: 0  Patient is on more than two specialty medications: No  Any gaps in refill history greater than 2 weeks in the last 3 months: no  Demonstrates understanding of importance of adherence: yes  Informant: patient  Reliability of informant: reliable  Provider-estimated medication adherence level: good  Patient is at risk for Non-Adherence: No  Reasons for non-adherence: no problems identified              Were doses missed due to medication being on hold? No    XOLAIR 150 mg/mL syringe (omalizumab)   : 0 days of medicine on hand   XOLAIR 75 mg/0.5 mL syringe (omalizumab)  : 0 days of medicine on hand       REFERRAL TO PHARMACIST     Referral to the pharmacist: Not needed      [facility]     Shipping address confirmed in Epic.       Delivery Scheduled: Yes, Expected medication delivery date: 06/25/22.     Medication will be delivered via Same Day Courier to the prescription address in Epic WAM.    Adenike Shidler' W Danae Chen Shared [facility] Specialty Technician

## 2022-06-22 MED ORDER — DEXTROAMPHETAMINE-AMPHETAMINE 20 MG TABLET
ORAL_TABLET | Freq: Every day | ORAL | 0 refills | 30 days | Status: CP
Start: 2022-06-22 — End: 2022-07-21

## 2022-06-22 MED ORDER — LISDEXAMFETAMINE 60 MG CAPSULE
ORAL_CAPSULE | Freq: Every morning | ORAL | 0 refills | 30 days | Status: CP
Start: 2022-06-22 — End: 2022-07-21

## 2022-06-25 MED FILL — XOLAIR 75 MG/0.5 ML SUBCUTANEOUS SYRINGE: SUBCUTANEOUS | 28 days supply | Qty: 1 | Fill #2

## 2022-06-25 MED FILL — XOLAIR 150 MG/ML SUBCUTANEOUS SYRINGE: SUBCUTANEOUS | 28 days supply | Qty: 2 | Fill #2

## 2022-07-03 NOTE — Unmapped (Signed)
requested appt w/ Asher Muir - asked pt to give [place] a call to reschedule missed appt

## 2022-07-04 ENCOUNTER — Telehealth: Admit: 2022-07-04 | Discharge: 2022-07-05 | Payer: PRIVATE HEALTH INSURANCE | Attending: School | Primary: School

## 2022-07-04 DIAGNOSIS — F419 Anxiety disorder, unspecified: Principal | ICD-10-CM

## 2022-07-04 DIAGNOSIS — F909 Attention-deficit hyperactivity disorder, unspecified type: Principal | ICD-10-CM

## 2022-07-04 DIAGNOSIS — F3340 Major depressive disorder, recurrent, in remission, unspecified: Principal | ICD-10-CM

## 2022-07-04 DIAGNOSIS — F32A Anxiety and depression: Principal | ICD-10-CM

## 2022-07-04 DIAGNOSIS — F429 Obsessive-compulsive disorder, unspecified: Principal | ICD-10-CM
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
## 2022-07-16 ENCOUNTER — Telehealth: Admit: 2022-07-16 | Discharge: 2022-07-17 | Payer: PRIVATE HEALTH INSURANCE | Attending: School | Primary: School

## 2022-07-16 DIAGNOSIS — F32A Anxiety and depression: Principal | ICD-10-CM

## 2022-07-16 DIAGNOSIS — F909 Attention-deficit hyperactivity disorder, unspecified type: Principal | ICD-10-CM

## 2022-07-16 DIAGNOSIS — F3340 Major depressive disorder, recurrent, in remission, unspecified: Principal | ICD-10-CM

## 2022-07-16 DIAGNOSIS — F419 Anxiety disorder, unspecified: Principal | ICD-10-CM

## 2022-07-16 DIAGNOSIS — F429 Obsessive-compulsive disorder, unspecified: Principal | ICD-10-CM
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
## 2022-07-18 MED ORDER — DEXTROAMPHETAMINE-AMPHETAMINE 20 MG TABLET
ORAL_TABLET | Freq: Every day | ORAL | 0 refills | 30 days | Status: CP
Start: 2022-07-18 — End: 2022-08-16

## 2022-07-18 NOTE — Unmapped (Signed)
Patient comment: Hey there. My medications are going well, however they do not have any refills left at the pharmacy. [place] beach [place] this coming [place] so I wanted to go ahead and get that taken care of as I'm nearing the end of my last bottle. Thank you in advance!

## 2022-07-19 NOTE — Unmapped (Signed)
[facility] Refill Coordination Note    Shivali Pilapil, DOB: [birth year]  Phone: [phone] (home)       All above HIPAA information was verified with patient.         07/18/2022    12:38 PM   Specialty Rx Medication Refill Questionnaire   Which Medications would you like refilled and shipped? XOLAIR 75 mg/0.5 mL syringe (omalizumab)  and XOLAIR 150 mg/mL syringe (omalizumab), None on hand   Please list all current allergies: None   Have you missed any doses in the last 30 days? No   Have you had any changes to your medication(s) since your last refill? No   How many days remaining of each medication do you have at home? 0   If receiving an injectable medication, next injection date is 07/23/2022   Have you experienced any side effects in the last 30 days? No   Please enter the full address (street address, city, state, zip code) where you would like your medication(s) to be delivered to. [address] [place], [place] [address]   Please specify on which day you would like your medication(s) to arrive. Note: if you need your medication(s) within 3 days, please call the pharmacy to schedule your order at [phone]  07/20/2022   Has your insurance changed since your last refill? No   Would you like a pharmacist to call you to discuss your medication(s)? No   Do you require a signature for your package? (Note: if we are billing Medicare Part B or your order contains a controlled substance, we will require a signature) No         Completed refill call assessment today to schedule patient's medication shipment from the [facility] [phone]).  All relevant notes have been reviewed.       Confirmed patient received a Conservation officer, historic buildings and a Surveyor, mining with first shipment. The patient will receive a drug information handout for each medication shipped and additional FDA Medication Guides as required.         REFERRAL TO PHARMACIST     Referral to the pharmacist: Not needed      [facility]     Shipping address confirmed in Epic.     Delivery Scheduled: Yes, Expected medication delivery date: 07/20/22.     Medication will be delivered via Same Day Courier to the prescription address in Epic WAM.    Sofi Bryars' W Danae Chen Shared [facility] Specialty Technician

## 2022-07-20 MED FILL — XOLAIR 150 MG/ML SUBCUTANEOUS SYRINGE: SUBCUTANEOUS | 28 days supply | Qty: 2 | Fill #3

## 2022-07-20 MED FILL — XOLAIR 75 MG/0.5 ML SUBCUTANEOUS SYRINGE: SUBCUTANEOUS | 28 days supply | Qty: 1 | Fill #3

## 2022-07-23 ENCOUNTER — Telehealth: Admit: 2022-07-23 | Discharge: 2022-07-24 | Payer: PRIVATE HEALTH INSURANCE

## 2022-07-23 MED ORDER — AMOXICILLIN 875 MG-POTASSIUM CLAVULANATE 125 MG TABLET
ORAL_TABLET | Freq: Two times a day (BID) | ORAL | 0 refills | 7 days | Status: CP
Start: 2022-07-23 — End: 2022-07-30

## 2022-07-30 ENCOUNTER — Telehealth
Admit: 2022-07-30 | Discharge: 2022-07-31 | Payer: PRIVATE HEALTH INSURANCE | Attending: Psychiatric/Mental Health | Primary: Psychiatric/Mental Health

## 2022-07-30 DIAGNOSIS — F909 Attention-deficit hyperactivity disorder, unspecified type: Principal | ICD-10-CM

## 2022-07-30 DIAGNOSIS — F419 Anxiety disorder, unspecified: Principal | ICD-10-CM

## 2022-07-30 DIAGNOSIS — F32A Anxiety and depression: Principal | ICD-10-CM

## 2022-07-30 MED ORDER — DEXTROAMPHETAMINE-AMPHETAMINE 20 MG TABLET
ORAL_TABLET | Freq: Every day | ORAL | 0 refills | 30 days | Status: CP
Start: 2022-07-30 — End: 2022-08-29

## 2022-07-30 MED ORDER — LISDEXAMFETAMINE 60 MG CAPSULE
ORAL_CAPSULE | Freq: Every morning | ORAL | 0 refills | 30 days | Status: CP
Start: 2022-07-30 — End: 2022-08-29
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
## 2022-08-02 ENCOUNTER — Telehealth: Admit: 2022-08-02 | Discharge: 2022-08-03 | Payer: PRIVATE HEALTH INSURANCE | Attending: School | Primary: School

## 2022-08-02 DIAGNOSIS — F429 Obsessive-compulsive disorder, unspecified: Principal | ICD-10-CM

## 2022-08-02 DIAGNOSIS — F419 Anxiety disorder, unspecified: Principal | ICD-10-CM

## 2022-08-02 DIAGNOSIS — F909 Attention-deficit hyperactivity disorder, unspecified type: Principal | ICD-10-CM
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
## 2022-08-10 NOTE — Unmapped (Signed)
[facility] Clinical Assessment & Refill Coordination Note    Jamie Webb, DOB: 1990-07-27  Phone: There are no phone numbers on file.    All above HIPAA information was verified with patient.     Was a Nurse, learning disability used for this call? No    Specialty Medication(s):   CF/Pulmonary/Asthma: Xolair 150mg  & 75mg      Current Outpatient Medications   Medication Sig Dispense Refill    albuterol HFA 90 mcg/actuation inhaler Inhale 2 puffs two (2) times a day as needed for wheezing. 18 g 5    budesonide-formoterol (SYMBICORT) 160-4.5 mcg/actuation inhaler Inhale 2 puffs two (2) times a day. 10.2 g 0    dextroamphetamine-amphetamine (ADDERALL) 20 mg tablet Take 1 tablet (20 mg total) by mouth daily. 30 tablet 0    [START ON 08/29/2022] dextroamphetamine-amphetamine (ADDERALL) 20 mg tablet Take 1 tablet (20 mg total) by mouth daily. 30 tablet 0    empty container Misc USE AS DIRECTED 1 each 0    EPINEPHrine (EPIPEN) 0.3 mg/0.3 mL injection Inject 0.3 mL (0.3 mg total) into the muscle once for 1 dose AS INSTRUCTED FOR ANAPHYLAXIS 2 each 0    famotidine (PEPCID) 20 MG tablet Take 1 tablet (20 mg total) by mouth daily. 30 tablet 1    fluoride, sodium, 1.1 % Pste       FLUoxetine (PROZAC) 20 MG capsule Take 3 capsules (60 mg total) by mouth daily. 270 capsule 0    fluticasone propionate (FLONASE) 50 mcg/actuation nasal spray 2 sprays into each nostril daily. 16 g 0    gabapentin (NEURONTIN) 300 MG capsule Take 1 capsule (300 mg total) by mouth nightly as needed (restless legs). 90 capsule 3    hydrOXYzine (ATARAX) 25 MG tablet Take 1 tablet (25 mg total) by mouth daily as needed.      lisdexamfetamine (VYVANSE) 60 MG cap capsule Take 1 capsule (60 mg total) by mouth every morning. 30 capsule 0    [START ON 08/29/2022] lisdexamfetamine (VYVANSE) 60 MG cap capsule Take 1 capsule (60 mg total) by mouth every morning. 30 capsule 0    [START ON 09/28/2022] lisdexamfetamine (VYVANSE) 60 MG cap capsule Take 1 capsule (60 mg total) by mouth every morning. 30 capsule 0    omalizumab (XOLAIR) 150 mg/mL syringe Inject the contents of 1 syringe (150 mg total) under the skin every fourteen (14) days. Inject along with 75 mg syringe for total dose of 225 mg every 14 days. 2 mL 4    omalizumab (XOLAIR) 75 mg/0.5 mL syringe Inject the contents of 1 syringe (75 mg total) under the skin every fourteen (14) days. Inject along with 150 mg syringe for total dose of 225 mg every 14 days. 1 mL 4    ondansetron (ZOFRAN) 4 MG tablet Take 1 tablet (4 mg total) by mouth daily as needed for nausea. 30 tablet 1    pantoprazole (PROTONIX) 40 MG tablet TAKE 1 TABLET BY MOUTH EVERY DAY 90 tablet 1    SUMAtriptan (IMITREX) 50 MG tablet Take 1 tablet (50 mg total) by mouth every two (2) hours as needed for migraine (max of 2 per 24 hours). 30 tablet 1     No current facility-administered medications for this visit.        Changes to medications: [name] reports no changes at this time.    No Known Allergies    Changes to allergies: No    SPECIALTY MEDICATION ADHERENCE  Xolair 150 mg/ml: 0 days of medicine on hand   Xolair 75  0.5mg /mL : 0 days of medicine on hand     Medication Adherence    Patient reported X missed doses in the last month: 0  Specialty Medication: Xolair 225 mg Q14d  Patient is on additional specialty medications: No  Patient is on more than two specialty medications: No  Any gaps in refill history greater than 2 weeks in the last 3 months: no  Demonstrates understanding of importance of adherence: yes  Informant: patient          Specialty medication(s) dose(s) confirmed: Regimen is correct and unchanged.     Are there any concerns with adherence? No    Adherence counseling provided? Not needed    CLINICAL MANAGEMENT AND INTERVENTION      Clinical Benefit Assessment:    Do you feel the medicine is effective or helping your condition? Yes    Clinical Benefit counseling provided? Progress note from 3/18 shows evidence of clinical benefit    Adverse Effects Assessment:    Are you experiencing any side effects? No    Are you experiencing difficulty administering your medicine? No    Quality of Life Assessment:    Quality of Life    Rheumatology  Oncology  Dermatology  Cystic Fibrosis          How many days over the past month did your asthma  keep you from your normal activities? For example, brushing your teeth or getting up in the morning. 0    Have you discussed this with your provider? Not needed    Acute Infection Status:    Acute infections noted within Epic:  No active infections  Patient reported infection: None    Therapy Appropriateness:    Is therapy appropriate and patient progressing towards therapeutic goals? Yes, therapy is appropriate and should be continued    DISEASE/MEDICATION-SPECIFIC INFORMATION      For patients on injectable medications: Patient currently has 0 doses left.  Next injection is scheduled for 08/20/22.    Asthma and Allergy: Have you had an asthma exacerbation in the last 30 days? No  Have you needed to use your rescue inhaler more often than usual in the last 30 days? Yes, using 2-3x/wk when doing yard work in increased heat  Have you needed to take steroids for your asthma in the last 30 days? No    PATIENT SPECIFIC NEEDS     Does the patient have any physical, cognitive, or cultural barriers? No    Is the patient high risk? No    Did the patient require a clinical intervention? No    Does the patient require physician intervention or other additional services (i.e., nutrition, smoking cessation, social work)? No    SOCIAL DETERMINANTS OF HEALTH     At the [facility], we have learned that life circumstances - like trouble affording food, housing, utilities, or transportation can affect the health of many of our patients.   That is why we wanted to ask: are you currently experiencing any life circumstances that are negatively impacting your health and/or quality of life? Patient declined to answer    Social Determinants of Health     Financial Resource Strain: Low Risk  (01/08/2022)    Overall Financial Resource Strain (CARDIA)     Difficulty of Paying Living Expenses: Not hard at all   Internet Connectivity: No Internet connectivity concern identified (12/05/2021)    Insurance account manager  Do you have access to internet services: Yes     How do you connect to the internet: Personal Device at home     Is your internet connection strong enough for you to watch video on your device without major problems?: Yes     Do you have enough data to get through the month?: Yes     Does at least one of the devices have a camera that you can use for video chat?: Yes   Food Insecurity: No Food Insecurity (01/08/2022)    Hunger Vital Sign     Worried About Running Out of Food in the Last Year: Never true     Ran Out of Food in the Last Year: Never true   Tobacco Use: High Risk (07/23/2022)    Patient History     Smoking Tobacco Use: Some Days     Smokeless Tobacco Use: Never     Passive Exposure: Not on file   Housing/Utilities: Low Risk  (10/03/2020)    Housing/Utilities     Within the past 12 months, have you ever stayed: outside, in a car, in a tent, in an overnight shelter, or temporarily in someone else's home (i.e. couch-surfing)?: No     Are you worried about losing your housing?: No     Within the past 12 months, have you been unable to get utilities (heat, electricity) when it was really needed?: No   Alcohol Use: Not At Risk (10/03/2020)    Alcohol Use     How often do you have a drink containing alcohol?: Monthly or less     How many drinks containing alcohol do you have on a typical day when you are drinking?: 1 - 2     How often do you have 5 or more drinks on one occasion?: Never   Transportation Needs: No Transportation Needs (01/08/2022)    PRAPARE - Transportation     Lack of Transportation (Medical): No     Lack of Transportation (Non-Medical): No   Substance Use: Low Risk  (10/03/2020)    Substance Use     Taken prescription drugs for non-medical reasons: Never     Taken illegal drugs: Never     Patient indicated they have taken drugs in the past year for non-medical reasons: Yes, [positive answer(s)]: Not on file   Health Literacy: Low Risk  (10/03/2020)    Health Literacy     : Never   Physical Activity: Sufficiently Active (10/03/2020)    Exercise Vital Sign     Days of Exercise per Week: 5 days     Minutes of Exercise per Session: 60 min   Interpersonal Safety: Not at risk (12/05/2021)    Interpersonal Safety     Unsafe Where You Currently Live: No     Physically Hurt by Anyone: No     Abused by Anyone: No   Stress: No Stress Concern Present (01/08/2022)    [employer] of Occupational Health - Occupational Stress Questionnaire     Feeling of Stress : Not at all   Intimate Partner Violence: Not At Risk (10/03/2020)    Humiliation, Afraid, Rape, and Kick questionnaire     Fear of Current or Ex-Partner: No     Emotionally Abused: No     Physically Abused: No     Sexually Abused: No   Depression: Not at risk (12/03/2020)    PHQ-2     PHQ-2 Score: 1   Social Connections: Socially Integrated (10/03/2020)  Social Advertising account executive [NHANES]     Frequency of Communication with Friends and Family: More than three times a week     Frequency of Social Gatherings with Friends and Family: More than three times a week     Attends Religious Services: 1 to 4 times per year     Active Member of [employer] or Organizations: No     Attends Engineer, structural: 1 to 4 times per year     Marital Status: Living with partner       Would you be willing to receive help with any of the needs that you have identified today? Not applicable       SHIPPING     Specialty Medication(s) to be Shipped:   CF/Pulmonary/Asthma: Xolair 150mg  & 75mg     Other medication(s) to be shipped: No additional medications requested for fill at this time     Changes to insurance: No    Delivery Scheduled: Yes, Expected medication delivery date: 08/17/22.     Medication will be delivered via Same Day Courier to the confirmed prescription address in [place] [facility].    The patient will receive a drug information handout for each medication shipped and additional FDA Medication Guides as required.  Verified that patient has previously received a Conservation officer, historic buildings and a Surveyor, mining.    The patient or caregiver noted above participated in the development of this care plan and knows that they can request review of or adjustments to the care plan at any time.      All of the patient's questions and concerns have been addressed.    Oliva Bustard, PharmD   [facility] Specialty Pharmacist

## 2022-08-17 ENCOUNTER — Telehealth: Admit: 2022-08-17 | Payer: PRIVATE HEALTH INSURANCE | Attending: School | Primary: School

## 2022-08-17 MED FILL — XOLAIR 150 MG/ML SUBCUTANEOUS SYRINGE: SUBCUTANEOUS | 28 days supply | Qty: 2 | Fill #4

## 2022-08-17 MED FILL — XOLAIR 75 MG/0.5 ML SUBCUTANEOUS SYRINGE: SUBCUTANEOUS | 28 days supply | Qty: 1 | Fill #4
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
## 2022-08-26 MED ORDER — FLUOXETINE 20 MG CAPSULE
ORAL_CAPSULE | ORAL | 0 refills | 0 days | Status: CP
Start: 2022-08-26 — End: ?

## 2022-08-26 NOTE — Unmapped (Signed)
Pharmacy contacted the clinic on behalf of the patient. Pharmacy verified. Jamie Webb

## 2022-08-29 MED ORDER — DEXTROAMPHETAMINE-AMPHETAMINE 20 MG TABLET
ORAL_TABLET | Freq: Every day | ORAL | 0 refills | 30 days | Status: CP
Start: 2022-08-29 — End: 2022-09-28

## 2022-08-29 MED ORDER — LISDEXAMFETAMINE 60 MG CAPSULE
ORAL_CAPSULE | Freq: Every morning | ORAL | 0 refills | 30 days | Status: CP
Start: 2022-08-29 — End: 2022-09-28

## 2022-08-30 IMAGING — CR DG CHEST 2V
2 series · 2 of 2 positions shown · non-contrast
Comparison: None.

CLINICAL DATA: Persistent cough for 3 weeks

EXAM:
CHEST - 2 VIEW

[chest pa]
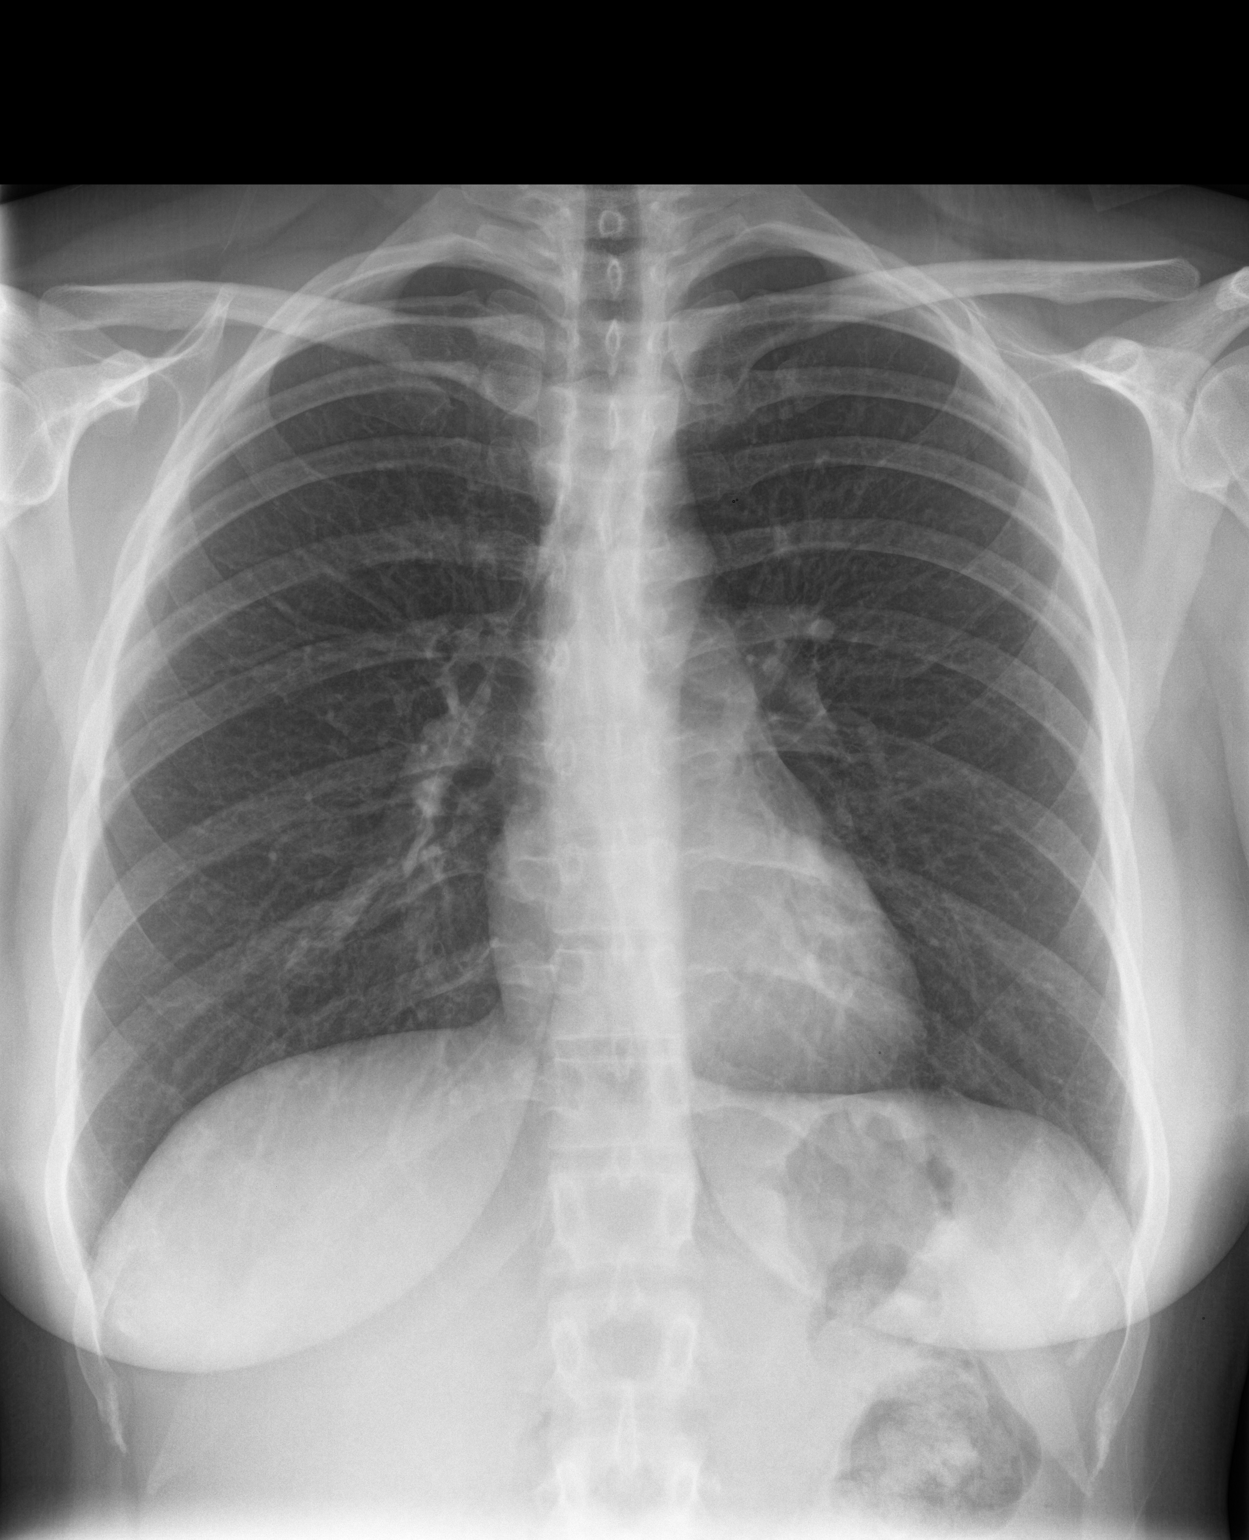

[chest lat]
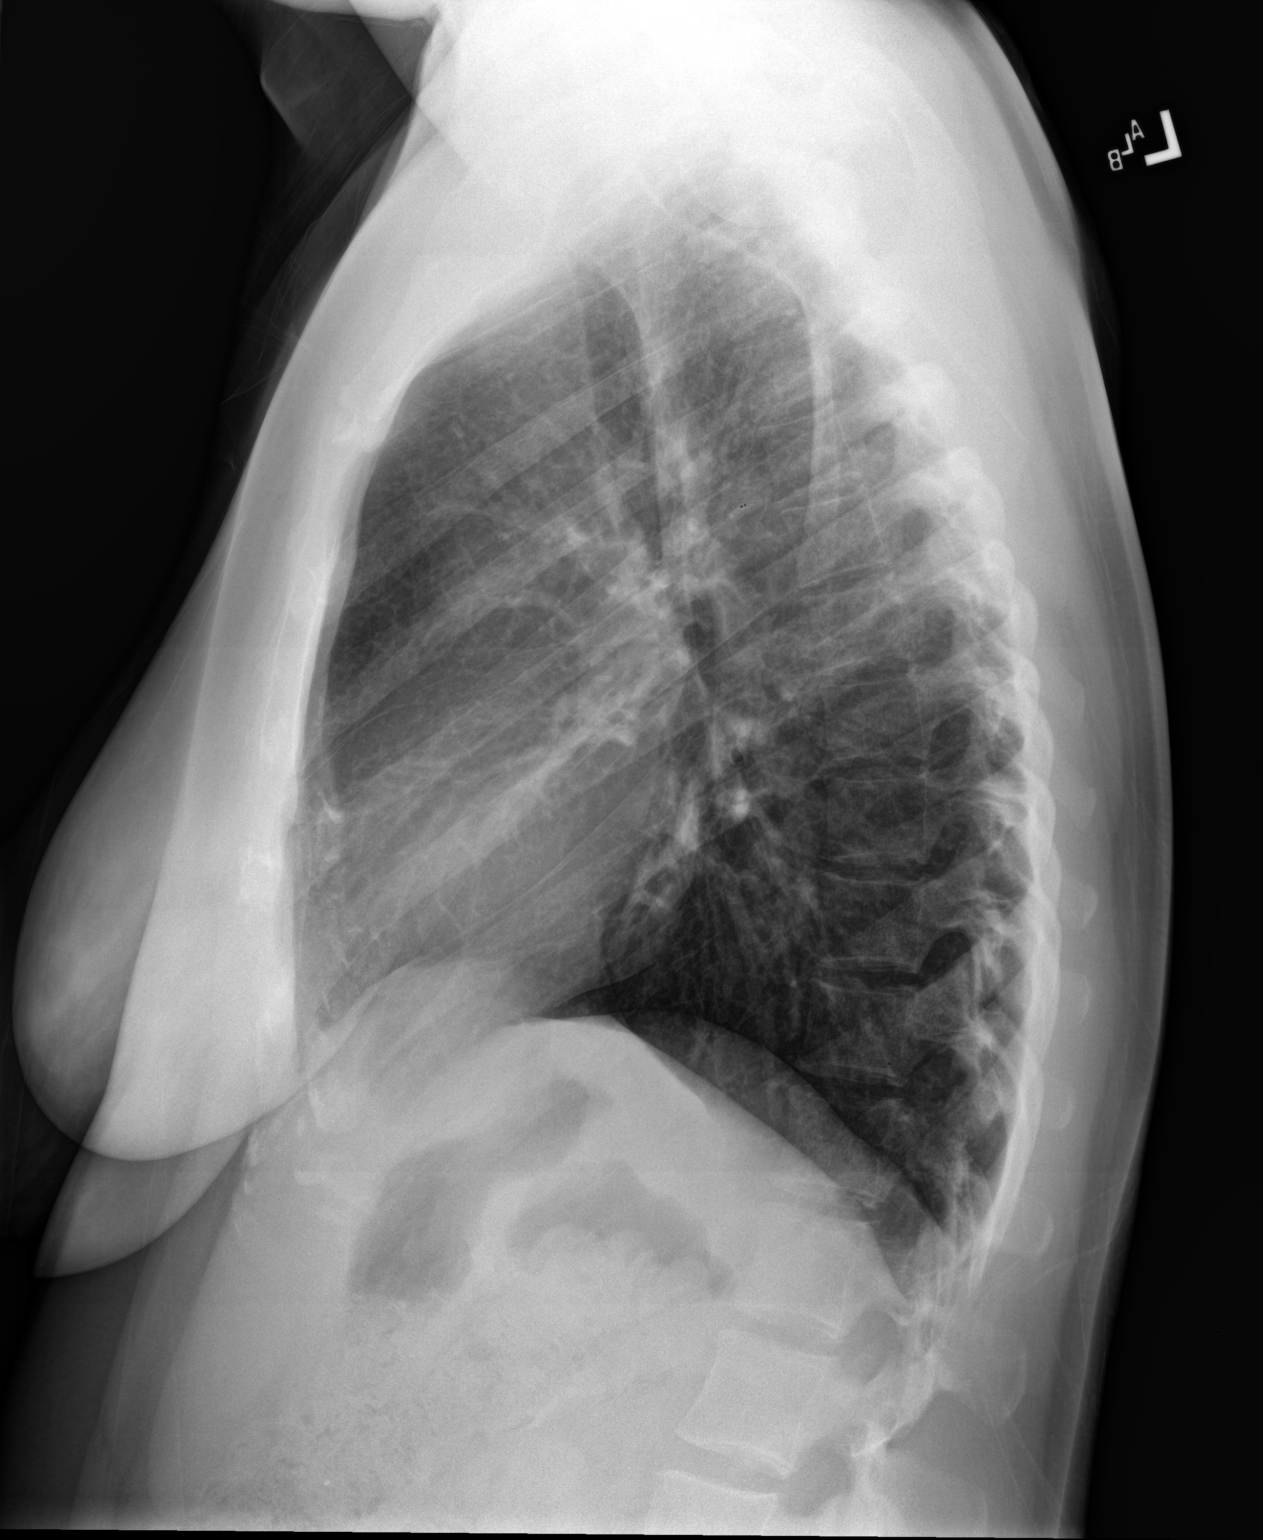

[2 of 2 positions shown; findings below may reference images not displayed]

FINDINGS: The heart size and mediastinal contours are within normal limits.
Both lungs are clear. The visualized skeletal structures are
unremarkable.
IMPRESSION: No active cardiopulmonary disease.

## 2022-08-31 ENCOUNTER — Telehealth: Admit: 2022-08-31 | Discharge: 2022-09-01 | Payer: PRIVATE HEALTH INSURANCE | Attending: School | Primary: School

## 2022-08-31 DIAGNOSIS — F32A Anxiety and depression: Principal | ICD-10-CM

## 2022-08-31 DIAGNOSIS — F909 Attention-deficit hyperactivity disorder, unspecified type: Principal | ICD-10-CM

## 2022-08-31 DIAGNOSIS — F419 Anxiety disorder, unspecified: Principal | ICD-10-CM
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
## 2022-09-04 ENCOUNTER — Ambulatory Visit
Admission: EM | Admit: 2022-09-04 | Discharge: 2022-09-04 | Disposition: A | Discharge status: Home / Self Care | Payer: Medicaid Other | Attending: Emergency Medicine | Admitting: Emergency Medicine

## 2022-09-04 ENCOUNTER — Encounter: Payer: Self-pay | Admitting: Emergency Medicine

## 2022-09-04 DIAGNOSIS — F17200 Nicotine dependence, unspecified, uncomplicated: Secondary | ICD-10-CM | POA: Insufficient documentation

## 2022-09-04 DIAGNOSIS — Z889 Allergy status to unspecified drugs, medicaments and biological substances status: Secondary | ICD-10-CM | POA: Diagnosis present

## 2022-09-04 DIAGNOSIS — J069 Acute upper respiratory infection, unspecified: Secondary | ICD-10-CM | POA: Diagnosis present

## 2022-09-04 DIAGNOSIS — Z1152 Encounter for screening for COVID-19: Secondary | ICD-10-CM | POA: Diagnosis not present

## 2022-09-04 HISTORY — DX: Allergy status to unspecified drugs, medicaments and biological substances: Z88.9

## 2022-09-04 LAB — SARS CORONAVIRUS 2 BY RT PCR: SARS Coronavirus 2 by RT PCR: NEGATIVE

## 2022-09-04 NOTE — ED Triage Notes (Signed)
Pt c/o cough, fatigue, dizziness, chest congestion and sinus pressure for over 1 week.

## 2022-09-04 NOTE — Discharge Instructions (Addendum)
Your COVID test was negative.  Rest,push fluids, please follow-up with your PCP/ENT at [facility] for further evaluation next week if symptoms persist. Recommend smoking cessation to help with symptom management. Return as needed.
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
## 2022-09-06 DIAGNOSIS — J4551 Severe persistent asthma with (acute) exacerbation: Principal | ICD-10-CM

## 2022-09-06 MED ORDER — XOLAIR 150 MG/ML SUBCUTANEOUS SYRINGE
SUBCUTANEOUS | 4 refills | 28 days
Start: 2022-09-06 — End: ?

## 2022-09-06 MED ORDER — XOLAIR 75 MG/0.5 ML SUBCUTANEOUS SYRINGE
SUBCUTANEOUS | 4 refills | 28 days
Start: 2022-09-06 — End: ?

## 2022-09-07 MED ORDER — XOLAIR 150 MG/ML SUBCUTANEOUS SYRINGE
SUBCUTANEOUS | 4 refills | 28 days | Status: CP
Start: 2022-09-07 — End: ?

## 2022-09-07 MED ORDER — XOLAIR 75 MG/0.5 ML SUBCUTANEOUS SYRINGE
SUBCUTANEOUS | 4 refills | 28 days | Status: CP
Start: 2022-09-07 — End: ?
  Filled 2022-09-17: qty 2, 28d supply, fill #0

## 2022-09-14 NOTE — Unmapped (Signed)
[facility] Refill Coordination Note    Specialty Medication(s) to be Shipped:   CF/Pulmonary/Asthma: Xolair Xolair 150mg /ml  XOLAIR 75 mg/0.5 mL syringe (omalizumab)    Other medication(s) to be shipped: No additional medications requested for fill at this time     Jamie Webb, DOB: April 29, 1990  Phone: There are no phone numbers on file.      All above HIPAA information was verified with patient.     Was a Nurse, learning disability used for this call? No    Completed refill call assessment today to schedule patient's medication shipment from the [facility] [phone]).  All relevant notes have been reviewed.     Specialty medication(s) and dose(s) confirmed: Regimen is correct and unchanged.   Changes to medications: [name] reports no changes at this time.  Changes to insurance: No  New side effects reported not previously addressed with a pharmacist or physician: None reported  Questions for the pharmacist: No    Confirmed patient received a Conservation officer, historic buildings and a Surveyor, mining with first shipment. The patient will receive a drug information handout for each medication shipped and additional FDA Medication Guides as required.       DISEASE/MEDICATION-SPECIFIC INFORMATION        For patients on injectable medications: Patient currently has 0 doses left.  Next injection is scheduled for 09/17/22.    SPECIALTY MEDICATION ADHERENCE     Medication Adherence    Patient reported X missed doses in the last month: 0  Specialty Medication: XOLAIR 75 mg/0.5 mL syringe (omalizumab)  Patient is on additional specialty medications: Yes  Additional Specialty Medications: XOLAIR 150 mg/mL syringe (omalizumab)  Patient Reported Additional Medication X Missed Doses in the Last Month: 0  Patient is on more than two specialty medications: No  Any gaps in refill history greater than 2 weeks in the last 3 months: no  Demonstrates understanding of importance of adherence: yes  Informant: patient  Reliability of informant: reliable  Provider-estimated medication adherence level: good  Patient is at risk for Non-Adherence: No  Reasons for non-adherence: no problems identified              Were doses missed due to medication being on hold? No    XOLAIR 150 mg/mL syringe (omalizumab)  : 0 days of medicine on hand   XOLAIR 75 mg/0.5 mL syringe (omalizumab)  : 0 days of medicine on hand       REFERRAL TO PHARMACIST     Referral to the pharmacist: Not needed      [facility]     Shipping address confirmed in Epic.       Delivery Scheduled: Yes, Expected medication delivery date: 09/17/22.     Medication will be delivered via Same Day Courier to the prescription address in Epic W[facility].    Jamie Webb' W Danae Chen [facility] Specialty Technician

## 2022-09-17 MED FILL — XOLAIR 75 MG/0.5 ML SUBCUTANEOUS SYRINGE: SUBCUTANEOUS | 28 days supply | Qty: 1 | Fill #0

## 2022-09-26 ENCOUNTER — Telehealth: Admit: 2022-09-26 | Discharge: 2022-09-27 | Payer: PRIVATE HEALTH INSURANCE | Attending: School | Primary: School

## 2022-09-26 DIAGNOSIS — F32A Anxiety and depression: Principal | ICD-10-CM

## 2022-09-26 DIAGNOSIS — F419 Anxiety disorder, unspecified: Principal | ICD-10-CM

## 2022-09-26 DIAGNOSIS — F909 Attention-deficit hyperactivity disorder, unspecified type: Principal | ICD-10-CM
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
## 2022-09-28 MED ORDER — LISDEXAMFETAMINE 60 MG CAPSULE
ORAL_CAPSULE | Freq: Every morning | ORAL | 0 refills | 30 days | Status: CP
Start: 2022-09-28 — End: 2022-10-28

## 2022-10-10 ENCOUNTER — Telehealth: Admit: 2022-10-10 | Discharge: 2022-10-11 | Payer: PRIVATE HEALTH INSURANCE | Attending: School | Primary: School

## 2022-10-10 DIAGNOSIS — F909 Attention-deficit hyperactivity disorder, unspecified type: Principal | ICD-10-CM

## 2022-10-10 DIAGNOSIS — F419 Anxiety disorder, unspecified: Principal | ICD-10-CM

## 2022-10-10 DIAGNOSIS — F32A Anxiety and depression: Principal | ICD-10-CM
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
## 2022-10-11 NOTE — Unmapped (Signed)
[facility] Refill Coordination Note    Jamie Webb, DOB: 1990/06/06  Phone: There are no phone numbers on file.      All above HIPAA information was verified with patient.         10/10/2022     7:50 PM   Specialty Rx Medication Refill Questionnaire   Which Medications would you like refilled and shipped? XOLAIR 75 mg/0.5 mL syringe (omalizumab)  and  XOLAIR 150 mg/mL syringe (omalizumab)   Please list all current allergies: None   Have you missed any doses in the last 30 days? No   Have you had any changes to your medication(s) since your last refill? No   How many days remaining of each medication do you have at home? 0   If receiving an injectable medication, next injection date is 10/15/2022   Have you experienced any side effects in the last 30 days? No   Please enter the full address (street address, city, state, zip code) where you would like your medication(s) to be delivered to. [address] , [place], [place] [address]   Please specify on which day you would like your medication(s) to arrive. Note: if you need your medication(s) within 3 days, please call the pharmacy to schedule your order at [phone]  10/12/2022   Has your insurance changed since your last refill? No   Would you like a pharmacist to call you to discuss your medication(s)? No   Do you require a signature for your package? (Note: if we are billing Medicare Part B or your order contains a controlled substance, we will require a signature) No         Completed refill call assessment today to schedule patient's medication shipment from the [facility] [phone]).  All relevant notes have been reviewed.       Confirmed patient received a Conservation officer, historic buildings and a Surveyor, mining with first shipment. The patient will receive a drug information handout for each medication shipped and additional FDA Medication Guides as required.         REFERRAL TO PHARMACIST     Referral to the pharmacist: Not needed      [facility]     Shipping address confirmed in Epic.     Delivery Scheduled: Yes, Expected medication delivery date: 10/12/22.     Medication will be delivered via Same Day Courier to the prescription address in Epic WAM.    Elzina Devera' W Danae Chen Shared [facility] Specialty Technician

## 2022-10-12 MED FILL — XOLAIR 150 MG/ML SUBCUTANEOUS SYRINGE: SUBCUTANEOUS | 28 days supply | Qty: 2 | Fill #1

## 2022-10-12 MED FILL — XOLAIR 75 MG/0.5 ML SUBCUTANEOUS SYRINGE: SUBCUTANEOUS | 28 days supply | Qty: 1 | Fill #1

## 2022-10-22 ENCOUNTER — Telehealth
Admit: 2022-10-22 | Discharge: 2022-10-23 | Payer: PRIVATE HEALTH INSURANCE | Attending: Psychiatric/Mental Health | Primary: Psychiatric/Mental Health

## 2022-10-22 DIAGNOSIS — F909 Attention-deficit hyperactivity disorder, unspecified type: Principal | ICD-10-CM

## 2022-10-22 DIAGNOSIS — F32A Anxiety and depression: Principal | ICD-10-CM

## 2022-10-22 DIAGNOSIS — F419 Anxiety disorder, unspecified: Principal | ICD-10-CM

## 2022-10-22 MED ORDER — GABAPENTIN 300 MG CAPSULE
ORAL_CAPSULE | Freq: Every evening | ORAL | 0 refills | 90 days | Status: CP | PRN
Start: 2022-10-22 — End: 2023-01-20

## 2022-10-22 MED ORDER — LISDEXAMFETAMINE 60 MG CAPSULE
ORAL_CAPSULE | Freq: Every morning | ORAL | 0 refills | 30 days | Status: CP
Start: 2022-10-22 — End: 2022-11-20

## 2022-10-22 MED ORDER — HYDROXYZINE HCL 10 MG TABLET
ORAL_TABLET | Freq: Every day | ORAL | 0 refills | 90 days | Status: CP | PRN
Start: 2022-10-22 — End: ?

## 2022-10-22 MED ORDER — DEXTROAMPHETAMINE-AMPHETAMINE 20 MG TABLET
ORAL_TABLET | Freq: Every day | ORAL | 0 refills | 30 days | Status: CP
Start: 2022-10-22 — End: 2022-11-21

## 2022-10-22 MED ORDER — FLUTICASONE PROPIONATE 50 MCG/ACTUATION NASAL SPRAY,SUSPENSION
Freq: Every day | NASAL | 0 refills | 60 days | Status: CN
Start: 2022-10-22 — End: 2023-10-22

## 2022-10-22 MED ORDER — EPINEPHRINE 0.3 MG/0.3 ML INJECTION, AUTO-INJECTOR
Freq: Once | INTRAMUSCULAR | 0 refills | 2 days | Status: CN
Start: 2022-10-22 — End: 2022-10-22
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
## 2022-10-24 ENCOUNTER — Telehealth: Admit: 2022-10-24 | Discharge: 2022-10-25 | Payer: PRIVATE HEALTH INSURANCE | Attending: School | Primary: School
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
## 2022-10-25 NOTE — Unmapped (Unsigned)
Assessment and Plan:     Diagnoses and all orders for this visit:    Severe persistent asthma with acute exacerbation    -----------  32 yo female presents today for follow up  -She has not been seen since 01/08/2022    Severe persistent asthma  -Pulmonology evaluation on 04/30/2022***    ADHD, Anxiety     Tobacco use disorder    HEALTH MAINTENANCE    Subjective:     Jamie Webb 31 y.o.female   is here for medications/follow up     No LMP recorded.        ROS:     ROS negative unless otherwise noted in HPI    Vital Signs:     Wt Readings from Last 3 Encounters:   04/30/22 87.1 kg (192 lb)   01/08/22 84.8 kg (187 lb)   12/05/21 84 kg (185 lb 3.2 oz)     Temp Readings from Last 3 Encounters:   04/30/22 36.7 ??C (98.1 ??F)   01/08/22 37.7 ??C (99.8 ??F) (Temporal)   12/05/21 37.1 ??C (98.7 ??F) (Temporal)     BP Readings from Last 3 Encounters:   04/30/22 115/69   01/08/22 110/80   12/05/21 114/70     Pulse Readings from Last 3 Encounters:   04/30/22 80   01/08/22 86   12/05/21 89       Objective:     General Appearance: Alert, cooperative, no distress, appears stated age.   Head and Neck: Normocephalic, atraumatic. No  masses. No thyromegaly. No bruits.   EENT: Eyes: PERRLA, Conjuntiva clear; Ears: bilateral TMs normal  Cardiovascular:  Regular rate and rhythm. No murmurs.  No pitting edema  Respiratory: Lungs clear to auscultation bilaterally,  Respiratory effort unremarkable. No wheezes or rhonchi.   Breast: Symmetrical. No masses or lumps. No nipple discharge.  No abnormal axillary nodes  Gastrointestinal: Abdomen soft and non-tender. No organomegaly or masses.  Genitourinary: Normal female external genitalia. Urethra non-tender and without masses, lesions. Vagina normal in appearance, without discharge, lesions. No significant prolapse. Cervix os identified without discharge. Uterus mobile, normal in size and non-tender. Bimanual exam revealed no masses or cervical motion tenderness.   Musculoskeletal: Gait and station unremarkable. Normal ROM. Normal strength and tone.   Extremities :No edema.  Peripheral pulses normal  Integumentary: Warm and dry.No rashes.  No abnormal appearing skin lesions  LYMPH NODES: Cervical, supraclavicular, and axillary nodes normal  Neurologic: Alert and oriented to place and time. CN II-XII grossly intact.   Psychiatric: Mood and affect appropriate for situation.

## 2022-11-02 ENCOUNTER — Telehealth: Admit: 2022-11-02 | Discharge: 2022-11-03 | Payer: PRIVATE HEALTH INSURANCE

## 2022-11-02 DIAGNOSIS — F17200 Nicotine dependence, unspecified, uncomplicated: Principal | ICD-10-CM

## 2022-11-02 DIAGNOSIS — F17210 Nicotine dependence, cigarettes, uncomplicated: Principal | ICD-10-CM

## 2022-11-02 MED ORDER — NICOTINE 21 MG/24 HR DAILY TRANSDERMAL PATCH
MEDICATED_PATCH | TRANSDERMAL | 0 refills | 28 days | Status: CP
Start: 2022-11-02 — End: 2022-12-02

## 2022-11-02 MED ORDER — NICOTINE (POLACRILEX) 4 MG BUCCAL LOZENGE
BUCCAL | 11 refills | 9 days | Status: CP | PRN
Start: 2022-11-02 — End: 2023-02-18

## 2022-11-02 NOTE — Unmapped (Signed)
[facility] Refill Coordination Note    Jamie Webb, DOB: 1990/11/03  Phone: [phone] (home)       All above HIPAA information was verified with patient.         11/02/2022    10:02 AM   Specialty Rx Medication Refill Questionnaire   Which Medications would you like refilled and shipped? XOLAIR 150 mg/mL syringe (omalizumab)   and  XOLAIR 75 mg/0.5 mL syringe (omalizumab   Please list all current allergies: None   Have you missed any doses in the last 30 days? No   Have you had any changes to your medication(s) since your last refill? No   How many days remaining of each medication do you have at home? 0   If receiving an injectable medication, next injection date is 11/12/2022   Have you experienced any side effects in the last 30 days? No   Please enter the full address (street address, city, state, zip code) where you would like your medication(s) to be delivered to. [address] [place], [place] [address]   Please specify on which day you would like your medication(s) to arrive. Note: if you need your medication(s) within 3 days, please call the pharmacy to schedule your order at [phone]  11/07/2022   Has your insurance changed since your last refill? No   Would you like a pharmacist to call you to discuss your medication(s)? No   Do you require a signature for your package? (Note: if we are billing Medicare Part B or your order contains a controlled substance, we will require a signature) No         Completed refill call assessment today to schedule patient's medication shipment from the [facility] [phone]).  All relevant notes have been reviewed.       Confirmed patient received a Conservation officer, historic buildings and a Surveyor, mining with first shipment. The patient will receive a drug information handout for each medication shipped and additional FDA Medication Guides as required.         REFERRAL TO PHARMACIST     Referral to the pharmacist: Not needed      [facility]     Shipping address confirmed in Epic.     Delivery Scheduled: Yes, Expected medication delivery date: 11/07/22.     Medication will be delivered via Same Day Courier to the prescription address in Epic WAM.    [facility] Pharmacy Specialty Technician

## 2022-11-02 NOTE — Unmapped (Signed)
Addended by: Alden Server on: 11/02/2022 03:21 PM     Modules accepted: Orders

## 2022-11-02 NOTE — Unmapped (Signed)
This was a telehealth service performed by a resident and I was available to the resident via real-time audio and video connection. I agree with the patient's diagnosis and concur with the treatment plan as documented in the resident note.

## 2022-11-02 NOTE — Unmapped (Signed)
Video Visit Note    This medical encounter was conducted virtually using [email or web address]  TeleHealth protocols.      I have identified myself to the patient and conveyed my credentials to Ms. Vandewalker  I have explained the capabilities and limitations of telemedicine and the patient/proxy and myself both agree that it is appropriate for their current circumstances/symptoms.     Contact Information  Person Contacted: Irish Kupka Phone number: [phone] in case video is disconnected  Video Outcome: Spoke with [name]  Is there someone else in the room? No.      Purpose of contact:     Ms. Baillargeon is a 32 y.o. female is participating in a video visit for tobacco cessation counseling.  Patient consented to video visit.    [name] presents to the clinic as she is interested in considering smoking cessation with the right help. She reports a quit attempt in the past that lasted a year when she was pregnant with her daughter. She attributes this to discontinuing her Adderall at that time. However, this is not a viable long term solution as the Adderall helps her function on a daily basis. She reports keeping her hands busy with crafts and yardwork is a useful behavioral strategy. Stepping away to smoke gives her break from the day. One of her triggers includes stress in the car for which she occasionally uses a vape instead of cigarettes. She is interested in NRT and varenicline. She reports taking bupropion in the past and that is may have reacted with something she was taking. We discussed tucking gum and lozenges in the cheek to avoid stomach upset.     General Information  [facility]  Type of Visit: Initial  Session Number: 1  Tobacco Use Treatment Visit: Talked with patient  Permission To Engage In Conversation Re: PHI w/ Visitors Present: n/a    Tobacco Use During Past 30 Days  Time Since Last Tobacco Use: smoked a cigarette today (at least one puff)  Tobacco Withdrawal (Past 24 Hours): Anxiety  Type of Tobacco Products Used: Cigarettes, E-cigarette/vape  Quantity Used: 20  Quantity Per: day     Time to First Use After Waking: 5-30 minutes     Other Household Members Use Tobacco: Yes     Smoking Allowed in Vehicles: Yes       Tobacco Use History     Brand of Tobacco Used: Marlboro     Longest Time Without Tobacco: Years  # of Hours, Days, Weeks, Months or Years: 1  Most Recent Attempt: >10 years  Medications Used in Past Attempts: None          Behavioral Assessment  Why Uses: habit, coping  Reasons to Become Tobacco Free: daughter, health  Barriers/Challenges: medication, mood  Strategies: keep hands busy       Treatment Plan  Cessation Meds Currently Using: None        Plan to Obtain Outpatient Meds: Theresa Quitline     Patient's Plan Post Discharge/Visit: Plan to quit as soon as possible  Follow-up Plan: Appointment scheduled  Family Members Included in Intervention/Plan: No       Wrap-Up  Diagnosis: Tobacco use disorder, unspecified, uncomplicated (F17.200)  TTS Visit Length: >10 minutes     Tobacco dependence    Cigarette nicotine dependence, uncomplicated        Follow Up Plan & Next Steps:  1) Please continue encouraging this Pt to reach their  goals (see list above).   2) []  Patient will call office to schedule follow up appointment with this provider  [x]  Scheduled follow up video visit per patient request for: 11/16/22  []  Patient provided with instructions on how to reach this provider during times that the office is reducing face to face visits due to COVID-19    As part of this Video Visit, no in-person exam was conducted.     I personally spent 30 minutes counseling the patient via video (real-time audio and video) about tobacco cessation.  I spent an additional 10 minutes on pre- and post-visit activities. Alden Server, DO    The patient was physically located in [place] or a state in which I am permitted to provide care. The patient and/or parent/guardian understood that s/he may incur co-pays and cost sharing, and agreed to the telemedicine visit. The visit was reasonable and appropriate under the circumstances given the patient's presentation at the time.     The patient and/or parent/guardian has been advised of the potential risks and limitations of this mode of treatment (including, but not limited to, the absence of in-person examination) and has agreed to be treated using telemedicine. The patient's/patient's family's questions regarding telemedicine have been answered.      If the visit was completed in an ambulatory setting, the patient and/or parent/guardian has also been advised to contact their provider???s office for worsening conditions, and seek emergency medical treatment and/or call 911 if the patient deems either necessary.     Visit Format/Coding: Video (real-time audio and video)     Coding: Tobacco cessation counseling, greater than 10 minutes [phone])      [facility] [place] [place] [facility]  [address], [place], [place] [address]  Telephone [phone]  Fax [phone]  [email or web address]

## 2022-11-07 MED FILL — XOLAIR 150 MG/ML SUBCUTANEOUS SYRINGE: SUBCUTANEOUS | 28 days supply | Qty: 2 | Fill #2

## 2022-11-07 MED FILL — XOLAIR 75 MG/0.5 ML SUBCUTANEOUS SYRINGE: SUBCUTANEOUS | 28 days supply | Qty: 1 | Fill #2

## 2022-11-09 ENCOUNTER — Telehealth: Admit: 2022-11-09 | Discharge: 2022-11-10 | Payer: PRIVATE HEALTH INSURANCE | Attending: School | Primary: School

## 2022-11-09 MED ORDER — SUMATRIPTAN 50 MG TABLET
ORAL_TABLET | 4 refills | 0 days
Start: 2022-11-09 — End: ?
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
## 2022-11-12 MED ORDER — SUMATRIPTAN 50 MG TABLET
ORAL_TABLET | 4 refills | 0 days
Start: 2022-11-12 — End: ?

## 2022-11-12 NOTE — Unmapped (Signed)
Can you please contact patient to see if she does need a refill on the Imitrex? She additionally has an appointment with a different provider on 11/16/22, can you ask If she is planning on transferring care?   Thank you

## 2022-11-15 ENCOUNTER — Telehealth: Admit: 2022-11-15 | Discharge: 2022-11-16 | Payer: PRIVATE HEALTH INSURANCE | Attending: School | Primary: School
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
## 2022-11-16 ENCOUNTER — Telehealth: Admit: 2022-11-16 | Discharge: 2022-11-17 | Payer: PRIVATE HEALTH INSURANCE

## 2022-11-16 NOTE — Unmapped (Signed)
Video Visit Note    This medical encounter was conducted virtually using [email or web address]  [email or web address] protocols.      I have identified myself to the patient and conveyed my credentials to [name]  I have explained the capabilities and limitations of telemedicine and the patient/proxy and myself both agree that it is appropriate for their current circumstances/symptoms.     Contact Information  Person Contacted: Kriti Katayama Phone number: [phone] in case video is disconnected  Video Outcome: Spoke with [name]  Is there someone else in the room? No.      Purpose of contact:     Ms. Jamie Webb is a 32 y.o. female is participating in a video visit for tobacco cessation counseling. Patient consented to video visit.     Since last visit, she has obtained the NRT products and reports they were covered by her insurance. She has not started using them yet as she has been having stress in her life. She also finds that her partner smoking and watching it on TV makes her want to smoke. We discussed techniques to modulate behavior along with using the nicotine products to mitigate smoking while life is stressful and move toward being more prepared for future quit attempts. We discussed SMART goals she could make and she decided to work on quitting smoking during the time she takes breaks. We discussed using puzzles and her phone to keep her hands busy. We also discussed hand to mouth replacements and she likes the idea of veggie straws. We also discussed use of the lozenge on her breaks and patch ahead of time. Eduction was provided on how it can take multiple attempts to reach a goal and how baby steps make progress to long term goals. NRT use techniques and side effects were discussed and she verbalized understanding.    General Information  [facility]  Type of Visit: Follow-up  Session Number: 2  Tobacco Use Treatment Visit: Talked with patient  Permission To Engage In Conversation Re: PHI w/ Visitors Present: n/a    Tobacco Use During Past 30 Days  Time Since Last Tobacco Use: smoked a cigarette today (at least one puff)  Tobacco Withdrawal (Past 24 Hours): None noted  Type of Tobacco Products Used: Cigarettes, E-cigarette/vape  Quantity Used: 20  Quantity Per: day     Time to First Use After Waking: 5-30 minutes     Other Household Members Use Tobacco: Yes     Smoking Allowed in Vehicles: Yes       Tobacco Use History     Brand of Tobacco Used: Marlboro     Longest Time Without Tobacco: Years  # of Hours, Days, Weeks, Months or Years: 1  Most Recent Attempt: >10 years  Medications Used in Past Attempts: None          Behavioral Assessment  Why Uses: habit, coping  Reasons to Become Tobacco Free: daughter, health  Barriers/Challenges: medication, mood  Strategies: keep hands busy       Treatment Plan  Cessation Meds Currently Using: None        Plan to Obtain Outpatient Meds: [place] Quitline     Patient's Plan Post Discharge/Visit: Plan to quit as soon as possible  Follow-up Plan: Appointment scheduled  Family Members Included in Intervention/Plan: No       Wrap-Up  Diagnosis: Tobacco use disorder, unspecified, uncomplicated (F17.200)  TTS Visit Length: >10 minutes  There are no diagnoses linked to this encounter.    Follow Up Plan & Next Steps:  1) Please continue encouraging this Pt to reach their goals (see list above).   2) []  Patient will call office to schedule follow up appointment with this provider  [x]  Scheduled follow up video visit per patient request for:  []  Patient provided with instructions on how to reach this provider during times that the office is reducing face to face visits due to COVID-19    As part of this Video Visit, no in-person exam was conducted.     I personally spent 30 minutes counseling the patient via video (real-time audio and video) about tobacco cessation.  I spent an additional 10 minutes on pre- and post-visit activities. Alden Server, DO    The patient was physically located in [place] or a state in which I am permitted to provide care. The patient and/or parent/guardian understood that s/he may incur co-pays and cost sharing, and agreed to the telemedicine visit. The visit was reasonable and appropriate under the circumstances given the patient's presentation at the time.     The patient and/or parent/guardian has been advised of the potential risks and limitations of this mode of treatment (including, but not limited to, the absence of in-person examination) and has agreed to be treated using telemedicine. The patient's/patient's family's questions regarding telemedicine have been answered.      If the visit was completed in an ambulatory setting, the patient and/or parent/guardian has also been advised to contact their provider???s office for worsening conditions, and seek emergency medical treatment and/or call 911 if the patient deems either necessary.     Visit Format/Coding: Video (real-time audio and video)     Coding: Tobacco cessation counseling, greater than 10 minutes [phone])      [facility]  [address], [place], [place] [address]  Telephone [phone]  Fax [phone]  [email or web address]

## 2022-11-19 NOTE — Unmapped (Signed)
This was a telehealth service performed by a resident and I was available to the resident via real-time audio and video connection. I agree with the patient's diagnosis and concur with the treatment plan as documented in the resident note.

## 2022-11-21 MED ORDER — LISDEXAMFETAMINE 60 MG CAPSULE
ORAL_CAPSULE | Freq: Every morning | ORAL | 0 refills | 30 days | Status: CP
Start: 2022-11-21 — End: 2022-12-20

## 2022-11-21 MED ORDER — DEXTROAMPHETAMINE-AMPHETAMINE 20 MG TABLET
ORAL_TABLET | Freq: Every day | ORAL | 0 refills | 30 days | Status: CP
Start: 2022-11-21 — End: 2022-12-21

## 2022-11-27 DIAGNOSIS — F909 Attention-deficit hyperactivity disorder, unspecified type: Principal | ICD-10-CM

## 2022-11-27 MED ORDER — LISDEXAMFETAMINE 60 MG CAPSULE
ORAL_CAPSULE | Freq: Every morning | ORAL | 0 refills | 30 days | Status: CP
Start: 2022-11-27 — End: 2022-12-26

## 2022-11-27 NOTE — Unmapped (Signed)
Patient comment: Hey there, I had to do a partial refill the last time I refilled my Vyvanse due to quantity issues at the pharmacy. I'm set to take my last one tomorrow so I will be without medication on Wednesday. They said I would need to reach out to you for a new prescription. I'm hoping this isn't difficult for you and that it doesn't interfere with my other refills. Thank you so much in advance.

## 2022-11-29 ENCOUNTER — Telehealth: Admit: 2022-11-29 | Discharge: 2022-11-30 | Payer: PRIVATE HEALTH INSURANCE | Attending: School | Primary: School

## 2022-11-29 MED ORDER — FLUOXETINE 20 MG CAPSULE
ORAL_CAPSULE | ORAL | 0 refills | 0 days
Start: 2022-11-29 — End: ?
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
## 2022-11-30 MED ORDER — FLUOXETINE 20 MG CAPSULE
ORAL_CAPSULE | ORAL | 0 refills | 0 days | Status: CP
Start: 2022-11-30 — End: ?

## 2022-11-30 NOTE — Unmapped (Deleted)
Patient cancelled visit. Talked with her on the phone. MyChart message with available appointment times was sent as requested.

## 2022-12-05 NOTE — Unmapped (Signed)
[facility] Refill Coordination Note    Specialty Medication(s) to be Shipped:   CF/Pulmonary/Asthma: Xolair 150mg .ml  XOLAIR 75 mg/0.5 mL syringe (omalizumab)    Other medication(s) to be shipped: No additional medications requested for fill at this time     Jamie Webb, DOB: May 13, 1990  Phone: [phone] (home)       All above HIPAA information was verified with patient.     Was a Nurse, learning disability used for this call? No    Completed refill call assessment today to schedule patient's medication shipment from the [facility]  [phone]).  All relevant notes have been reviewed.     Specialty medication(s) and dose(s) confirmed: Regimen is correct and unchanged.   Changes to medications: [name] reports no changes at this time.  Changes to insurance: No  New side effects reported not previously addressed with a pharmacist or physician: None reported  Questions for the pharmacist: No    Confirmed patient received a Conservation officer, historic buildings and a Surveyor, mining with first shipment. The patient will receive a drug information handout for each medication shipped and additional FDA Medication Guides as required.       DISEASE/MEDICATION-SPECIFIC INFORMATION        For patients on injectable medications: Patient currently has 0 doses left.  Next injection is scheduled for 12/10/22.    SPECIALTY MEDICATION ADHERENCE     Medication Adherence    Patient reported X missed doses in the last month: 0  Specialty Medication: XOLAIR 75 mg/0.5 mL syringe (omalizumab)  Patient is on additional specialty medications: Yes  Additional Specialty Medications: XOLAIR 150 mg/mL syringe (omalizumab)  Patient Reported Additional Medication X Missed Doses in the Last Month: 0  Patient is on more than two specialty medications: No  Any gaps in refill history greater than 2 weeks in the last 3 months: no  Demonstrates understanding of importance of adherence: yes  Informant: patient  Reliability of informant: reliable  Provider-estimated medication adherence level: good  Patient is at risk for Non-Adherence: No  Reasons for non-adherence: no problems identified              Were doses missed due to medication being on hold? No    XOLAIR 75 mg/0.5 mL syringe (omalizumab)  : 0 doses of medicine on hand   XOLAIR 150 mg/mL syringe (omalizumab)  : 0 doses of medicine on hand       REFERRAL TO PHARMACIST     Referral to the pharmacist: Not needed      SHIPPING     Shipping address confirmed in Epic.       Delivery Scheduled: Yes, Expected medication delivery date: 12/07/22.     Medication will be delivered via Same Day Courier to the prescription address in Epic WAM.    [facility]  Specialty Technician

## 2022-12-07 MED FILL — XOLAIR 75 MG/0.5 ML SUBCUTANEOUS SYRINGE: SUBCUTANEOUS | 28 days supply | Qty: 1 | Fill #3

## 2022-12-07 MED FILL — XOLAIR 150 MG/ML SUBCUTANEOUS SYRINGE: SUBCUTANEOUS | 28 days supply | Qty: 2 | Fill #3

## 2022-12-11 ENCOUNTER — Ambulatory Visit (INDEPENDENT_AMBULATORY_CARE_PROVIDER_SITE_OTHER): Payer: Medicaid Other

## 2022-12-11 ENCOUNTER — Ambulatory Visit
Admission: EM | Admit: 2022-12-11 | Discharge: 2022-12-11 | Disposition: A | Discharge status: Home / Self Care | Payer: Medicaid Other | Attending: Emergency Medicine | Admitting: Emergency Medicine

## 2022-12-11 DIAGNOSIS — J189 Pneumonia, unspecified organism: Secondary | ICD-10-CM

## 2022-12-11 DIAGNOSIS — J069 Acute upper respiratory infection, unspecified: Secondary | ICD-10-CM

## 2022-12-11 MED ORDER — IPRATROPIUM BROMIDE 0.06 % NA SOLN: 2.0000 | Freq: Four times a day (QID) | NASAL | 12 refills | Status: DC

## 2022-12-11 MED ORDER — LEVOFLOXACIN 500 MG PO TABS: 500.0000 mg | ORAL_TABLET | Freq: Every day | ORAL | 0 refills | Status: DC

## 2022-12-11 MED ORDER — BENZONATATE 100 MG PO CAPS: 200.0000 mg | ORAL_CAPSULE | Freq: Three times a day (TID) | ORAL | 0 refills | Status: DC

## 2022-12-11 MED ORDER — PROMETHAZINE-DM 6.25-15 MG/5ML PO SYRP: 5.0000 mL | ORAL_SOLUTION | Freq: Four times a day (QID) | ORAL | 0 refills | Status: DC | PRN

## 2022-12-11 NOTE — Discharge Instructions (Addendum)
Your chest x-ray revealed a area that suspicious for pneumonia in the right lower lobe of your lung.  Your exam is also consistent with an upper respiratory infection.  Take the Levaquin 500 milligrams once daily for 7 days for treatment of both your URI and possible pneumonia.  Use your albuterol inhaler as needed for shortness of breath or wheezing.  Use the Atrovent nasal spray, 2 squirts up each nostril every 6 hours, as needed for nasal congestion and postnasal drip.  Use the Tessalon Perles every 8 hours during the day as needed for cough.  Take them with a small sip of water.  They may give you numbness to the base of your tongue or metallic taste in your mouth, this is normal.  Use the Promethazine DM cough syrup at bedtime as needed for cough and congestion.  Use over-the-counter Tylenol and/or ibuprofen according the package instructions as needed for fever or pain.  If your symptoms not improving, or they worsen, please return for reevaluation or see your PCP.

## 2022-12-11 NOTE — ED Triage Notes (Signed)
Patient states that she's had a cough x 1 week. Fever-headaches-chest congestion-  Hx of asthma.
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
## 2022-12-14 NOTE — Unmapped (Signed)
 This was a telehealth service performed by a resident and I was available to the resident via real-time audio and video connection. I agree with the patient's diagnosis and concur with the treatment plan as documented in the resident note.

## 2022-12-14 NOTE — Unmapped (Signed)
Pt cancelled visit

## 2022-12-21 MED ORDER — LISDEXAMFETAMINE 60 MG CAPSULE
ORAL_CAPSULE | Freq: Every morning | ORAL | 0 refills | 30 days | Status: CP
Start: 2022-12-21 — End: 2023-01-19

## 2022-12-21 MED ORDER — DEXTROAMPHETAMINE-AMPHETAMINE 20 MG TABLET
ORAL_TABLET | Freq: Every day | ORAL | 0 refills | 30 days | Status: CP
Start: 2022-12-21 — End: 2023-01-20

## 2022-12-26 NOTE — Unmapped (Signed)
[facility] Refill Coordination Note    Jamie Webb, DOB: [birth year]  Phone: [phone] (home)       All above HIPAA information was verified with patient.         12/25/2022    10:03 AM   Specialty Rx Medication Refill Questionnaire   Which Medications would you like refilled and shipped? XOLAIR 150 mg/mL syringe (omalizumab)   and  XOLAIR 75 mg/0.5 mL syringe (omalizumab)   Please list all current allergies: None   Have you missed any doses in the last 30 days? No   Have you had any changes to your medication(s) since your last refill? No   How many days remaining of each medication do you have at home? 0   If receiving an injectable medication, next injection date is 01/07/2023   Have you experienced any side effects in the last 30 days? No   Please enter the full address (street address, city, state, zip code) where you would like your medication(s) to be delivered to. [address] [place], [place] [address]   Please specify on which day you would like your medication(s) to arrive. Note: if you need your medication(s) within 3 days, please call the pharmacy to schedule your order at [phone]  12/31/2022   Has your insurance changed since your last refill? No   Would you like a pharmacist to call you to discuss your medication(s)? No   Do you require a signature for your package? (Note: if we are billing Medicare Part B or your order contains a controlled substance, we will require a signature) No         Completed refill call assessment today to schedule patient's medication shipment from the [facility] [phone]).  All relevant notes have been reviewed.       Confirmed patient received a Conservation officer, historic buildings and a Surveyor, mining with first shipment. The patient will receive a drug information handout for each medication shipped and additional FDA Medication Guides as required.         REFERRAL TO PHARMACIST     Referral to the pharmacist: Not needed      [facility]     Shipping address confirmed in Epic.     Delivery Scheduled: Yes, Expected medication delivery date: 12/31/22.     Medication will be delivered via Same Day Courier to the prescription address in Epic WAM.    [facility] Specialty Technician

## 2022-12-27 ENCOUNTER — Telehealth: Admit: 2022-12-27 | Discharge: 2022-12-28 | Payer: PRIVATE HEALTH INSURANCE | Attending: School | Primary: School

## 2022-12-27 DIAGNOSIS — F909 Attention-deficit hyperactivity disorder, unspecified type: Principal | ICD-10-CM

## 2022-12-27 MED ORDER — LISDEXAMFETAMINE 60 MG CAPSULE
ORAL_CAPSULE | Freq: Every morning | ORAL | 0 refills | 30 days | Status: CP
Start: 2022-12-27 — End: 2023-01-25
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
## 2022-12-31 MED FILL — XOLAIR 75 MG/0.5 ML SUBCUTANEOUS SYRINGE: SUBCUTANEOUS | 28 days supply | Qty: 1 | Fill #4

## 2022-12-31 MED FILL — XOLAIR 150 MG/ML SUBCUTANEOUS SYRINGE: SUBCUTANEOUS | 28 days supply | Qty: 2 | Fill #4

## 2023-01-07 ENCOUNTER — Telehealth
Admit: 2023-01-07 | Discharge: 2023-01-08 | Payer: PRIVATE HEALTH INSURANCE | Attending: Psychiatric/Mental Health | Primary: Psychiatric/Mental Health

## 2023-01-07 DIAGNOSIS — F419 Anxiety disorder, unspecified: Principal | ICD-10-CM

## 2023-01-07 DIAGNOSIS — F32A Anxiety and depression: Principal | ICD-10-CM

## 2023-01-07 DIAGNOSIS — F909 Attention-deficit hyperactivity disorder, unspecified type: Principal | ICD-10-CM

## 2023-01-07 MED ORDER — LISDEXAMFETAMINE 60 MG CAPSULE
ORAL_CAPSULE | Freq: Every morning | ORAL | 0 refills | 30 days | Status: CP
Start: 2023-01-07 — End: 2023-02-05

## 2023-01-07 MED ORDER — FLUOXETINE 40 MG CAPSULE
ORAL_CAPSULE | Freq: Every day | ORAL | 0 refills | 90 days | Status: CP
Start: 2023-01-07 — End: 2023-04-07

## 2023-01-07 MED ORDER — DEXTROAMPHETAMINE-AMPHETAMINE 20 MG TABLET
ORAL_TABLET | Freq: Every day | ORAL | 0 refills | 30 days | Status: CP
Start: 2023-01-07 — End: 2023-02-06
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
## 2023-01-23 DIAGNOSIS — J4551 Severe persistent asthma with (acute) exacerbation: Principal | ICD-10-CM

## 2023-01-23 MED ORDER — XOLAIR 75 MG/0.5 ML SUBCUTANEOUS SYRINGE
SUBCUTANEOUS | 4 refills | 28.00 days | Status: CP
Start: 2023-01-23 — End: ?
  Filled 2023-01-30: qty 2, 28d supply, fill #0

## 2023-01-23 MED ORDER — XOLAIR 150 MG/ML SUBCUTANEOUS SYRINGE
SUBCUTANEOUS | 4 refills | 28.00 days | Status: CP
Start: 2023-01-23 — End: ?

## 2023-01-23 NOTE — Unmapped (Signed)
She is all set and agreed, 04/22/23

## 2023-01-23 NOTE — Unmapped (Signed)
Requested Prescriptions     Pending Prescriptions Disp Refills    omalizumab (XOLAIR) 150 mg/mL syringe 2 mL 4     Sig: Inject the contents of 1 syringe (150 mg total) under the skin every fourteen (14) days. Inject along with 75 mg syringe for total dose of 225 mg every 14 days.    omalizumab (XOLAIR) 75 mg/0.5 mL syringe 1 mL 4     Sig: Inject the contents of 1 syringe (75 mg total) under the skin every fourteen (14) days. Inject along with 150 mg syringe for total dose of 225 mg every 14 days.      Patient was last seen 04/30/22.  No follow up scheduled at this time.

## 2023-01-25 ENCOUNTER — Telehealth: Admit: 2023-01-25 | Discharge: 2023-01-26 | Payer: PRIVATE HEALTH INSURANCE | Attending: School | Primary: School
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
## 2023-01-28 NOTE — Unmapped (Signed)
[facility] Refill Coordination Note    Specialty Medication(s) to be Shipped:   CF/Pulmonary/Asthma: Xolair 75mg  & 150mg     Other medication(s) to be shipped: No additional medications requested for fill at this time     Jamie Webb, DOB: 26-Jan-1991  Phone: [phone] (home)       All above HIPAA information was verified with patient.     Was a Nurse, learning disability used for this call? No    Completed refill call assessment today to schedule patient's medication shipment from the [facility]  [phone]).  All relevant notes have been reviewed.     Specialty medication(s) and dose(s) confirmed: Regimen is correct and unchanged.   Changes to medications: [name] reports no changes at this time.  Changes to insurance: No  New side effects reported not previously addressed with a pharmacist or physician: None reported  Questions for the pharmacist: No    Confirmed patient received a Conservation officer, historic buildings and a Surveyor, mining with first shipment. The patient will receive a drug information handout for each medication shipped and additional FDA Medication Guides as required.       DISEASE/MEDICATION-SPECIFIC INFORMATION        For patients on injectable medications: Patient currently has 0 doses left.  Next injection is scheduled for 12/23.    SPECIALTY MEDICATION ADHERENCE     Medication Adherence    Patient reported X missed doses in the last month: 0  Specialty Medication: XOLAIR 75 mg/0.5 mL syringe (omalizumab)  Patient is on additional specialty medications: Yes  Additional Specialty Medications: XOLAIR 150 mg/mL syringe (omalizumab)  Patient Reported Additional Medication X Missed Doses in the Last Month: 0  Patient is on more than two specialty medications: No  Any gaps in refill history greater than 2 weeks in the last 3 months: no  Demonstrates understanding of importance of adherence: yes  Informant: patient          Were doses missed due to medication being on hold? No    Xolair 75  mg/0.8mL : 0 doses of medicine on hand   Xolair 150 mg/ml: 0 doses of medicine on hand     REFERRAL TO PHARMACIST     Referral to the pharmacist: Not needed      SHIPPING     Shipping address confirmed in Epic.     Delivery Scheduled: Yes, Expected medication delivery date: 01/30/23.     Medication will be delivered via Same Day Courier to the prescription address in Epic WAM.    Oliva Bustard, PharmD   [facility]  Specialty Pharmacist

## 2023-01-30 MED FILL — XOLAIR 75 MG/0.5 ML SUBCUTANEOUS SYRINGE: SUBCUTANEOUS | 28 days supply | Qty: 1 | Fill #0

## 2023-02-12 MED ORDER — ONDANSETRON HCL 4 MG TABLET
ORAL_TABLET | 1 refills | 0.00 days
Start: 2023-02-12 — End: ?

## 2023-02-14 MED ORDER — ONDANSETRON HCL 4 MG TABLET
ORAL_TABLET | 1 refills | 0.00 days
Start: 2023-02-14 — End: ?

## 2023-02-15 ENCOUNTER — Encounter: Admit: 2023-02-15 | Discharge: 2023-02-16 | Payer: PRIVATE HEALTH INSURANCE

## 2023-02-15 DIAGNOSIS — R110 Nausea: Principal | ICD-10-CM

## 2023-02-15 MED ORDER — PANTOPRAZOLE 40 MG TABLET,DELAYED RELEASE
ORAL_TABLET | Freq: Every day | ORAL | 0 refills | 30.00 days | Status: CP
Start: 2023-02-15 — End: ?

## 2023-02-15 MED ORDER — ONDANSETRON 8 MG DISINTEGRATING TABLET
ORAL_TABLET | Freq: Three times a day (TID) | 0 refills | 7.00 days | Status: CP | PRN
Start: 2023-02-15 — End: 2023-02-22

## 2023-02-15 NOTE — Unmapped (Signed)
[facility] education officer Encounter  This medical encounter was conducted virtually using [email or web address]  TeleHealth protocols.    Patient ID: Jamie Webb is a 33 y.o. female who presents by video interaction for evaluation.    I have identified myself to the patient and conveyed my credentials to [place].   Patient has signed informed consent on file in medical record.    Present on Video Call: Is there someone else in the room? No.    Assessment/Plan:      Problem List Items Addressed This Visit          Other    Nausea - Primary    Reports she feels nauseous X 10 days. Worsening.  Unbearable nausea, fatigue.  Having heart burn and indigestion type symptoms. Bloating. Mild abdominal pain.  Denies vomiting or diarrhea.  Not able to do a lot due to this.  Denies chance of pregnancy.  Reports hx of stomach ulcers.  Tried zofran, helped some. Famotidine- didn't help.  Was of pantoprazole at one point, but has been out of it.  Advised I wonder if this is contributing.  Refill pantoprazole 40mg  QAM. Rx sent in for zofran 8mg  TID PRN.  Follow up if symptoms persist or worsen.  Patient verbalizes understanding and has no further questions at this time.          I provided an intervention for the Tobacco Use SDOH domain. The intervention was Education     -- Patient verbalized an understanding of today's assessment and recommendations, as well as the purpose of ongoing medications.    Follow-up with PCP    Medication adherence and barriers to the treatment plan have been addressed. Opportunities to optimize healthy behaviors have been discussed. Patient / caregiver voiced understanding.      Subjective:     HPI  Jamie Webb is 33 y.o. and presents today in the [facility] with symptoms.  The PCP for this patient is Mangel, Benison Pap, DO.     Reports she feels nauseous X 10 days.  Worsening.  Unbearable nausea, fatigue.  Having heart burn and indigestion type symptoms.  Bloating.  Mild abdominal pain.  Denies vomiting or diarrhea.  Not able to do a lot due to this.  Denies chance of pregnancy.  Reports hx of stomach ulcers.  Tried zofran, helped some. Famotidine- didn't help.    ROS  Review of Systems     All other ROS per HPI.    I have reviewed the problem list, past medical history, past family history, medications, and allergies and have updated/reconciled them if needed.     Objective:   Physical Exam  As part of this Video Visit, no in-person exam was conducted.  Video interaction permitted the following observations.    General: No acute distress.   HEENT:  EOMI.  No photophobia. No conjunctival injection.    RESP: Relaxed respiratory effort. No conversational dyspnea.   SKIN: No rashes noted.  NEURO: No tremors observed.  Normal speech.   PSYCH: Alert and oriented.  Speech fluent and sensible.  Calm affect.       The patient reports they are physically located in [place] and is currently: not at home. I conducted a audio/video visit. I spent  52m 47s on the video call with the patient. I spent an additional 3 minutes on pre- and post-visit activities on the date of service .

## 2023-02-15 NOTE — Unmapped (Signed)
Reports she feels nauseous X 10 days. Worsening.  Unbearable nausea, fatigue.  Having heart burn and indigestion type symptoms. Bloating. Mild abdominal pain.  Denies vomiting or diarrhea.  Not able to do a lot due to this.  Denies chance of pregnancy.  Reports hx of stomach ulcers.  Tried zofran, helped some. Famotidine- didn't help.  Was of pantoprazole at one point, but has been out of it.  Advised I wonder if this is contributing.  Refill pantoprazole 40mg  QAM. Rx sent in for zofran 8mg  TID PRN.  Follow up if symptoms persist or worsen.  Patient verbalizes understanding and has no further questions at this time.

## 2023-02-18 ENCOUNTER — Encounter
Admit: 2023-02-18 | Discharge: 2023-02-19 | Payer: PRIVATE HEALTH INSURANCE | Attending: Psychiatric/Mental Health | Primary: Psychiatric/Mental Health

## 2023-02-18 DIAGNOSIS — F909 Attention-deficit hyperactivity disorder, unspecified type: Principal | ICD-10-CM

## 2023-02-18 DIAGNOSIS — F419 Anxiety disorder, unspecified: Principal | ICD-10-CM

## 2023-02-18 DIAGNOSIS — F32A Anxiety and depression: Principal | ICD-10-CM

## 2023-02-18 MED ORDER — GABAPENTIN 300 MG CAPSULE
ORAL_CAPSULE | Freq: Every evening | ORAL | 0 refills | 90.00 days | Status: CP | PRN
Start: 2023-02-18 — End: 2023-05-19
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
## 2023-02-26 ENCOUNTER — Encounter: Admit: 2023-02-26 | Discharge: 2023-02-27 | Payer: PRIVATE HEALTH INSURANCE | Attending: School | Primary: School
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
## 2023-02-28 NOTE — Unmapped (Signed)
[facility] Refill Coordination Note    Specialty Medication(s) to be Shipped:   CF/Pulmonary/Asthma: Xolair 150mg .ml  XOLAIR 75 mg/0.5 mL syringe (omalizumab)    Other medication(s) to be shipped: No additional medications requested for fill at this time     [name], DOB: Jun 26, 1990  Phone: [phone] (home)       All above HIPAA information was verified with patient.     Was a Nurse, learning disability used for this call? No    Completed refill call assessment today to schedule patient's medication shipment from the [facility]  [phone]).  All relevant notes have been reviewed.     Specialty medication(s) and dose(s) confirmed: Regimen is correct and unchanged.   Changes to medications: [name] reports no changes at this time.  Changes to insurance: No  New side effects reported not previously addressed with a pharmacist or physician: None reported  Questions for the pharmacist: No    Confirmed patient received a Conservation officer, historic buildings and a Surveyor, mining with first shipment. The patient will receive a drug information handout for each medication shipped and additional FDA Medication Guides as required.       DISEASE/MEDICATION-SPECIFIC INFORMATION        For patients on injectable medications: Patient currently has 0 doses left.  Next injection is scheduled for 03/04/23.    SPECIALTY MEDICATION ADHERENCE     Medication Adherence    Patient reported X missed doses in the last month: 0  Specialty Medication: XOLAIR 150 mg/mL syringe (omalizumab)  Patient is on additional specialty medications: Yes  Additional Specialty Medications: XOLAIR 75 mg/0.5 mL syringe (omalizumab)  Patient Reported Additional Medication X Missed Doses in the Last Month: 0  Patient is on more than two specialty medications: No  Any gaps in refill history greater than 2 weeks in the last 3 months: no  Demonstrates understanding of importance of adherence: yes              Were doses missed due to medication being on hold? No    XOLAIR 75 mg/0.5 mL syringe (omalizumab)  : 0 doses of medicine on hand   XOLAIR 150 mg/mL syringe (omalizumab)  : 0 doses of medicine on hand       REFERRAL TO PHARMACIST     Referral to the pharmacist: Not needed      SHIPPING     Shipping address confirmed in Epic.       Delivery Scheduled: Yes, Expected medication delivery date: 03/01/23 .     Medication will be delivered via Same Day Courier to the prescription address in Epic WAM.    Ricci Barker   [facility]  Specialty Technician

## 2023-03-01 MED FILL — XOLAIR 75 MG/0.5 ML SUBCUTANEOUS SYRINGE: SUBCUTANEOUS | 28 days supply | Qty: 1 | Fill #1

## 2023-03-01 MED FILL — XOLAIR 150 MG/ML SUBCUTANEOUS SYRINGE: SUBCUTANEOUS | 28 days supply | Qty: 2 | Fill #1

## 2023-03-09 MED ORDER — PANTOPRAZOLE 40 MG TABLET,DELAYED RELEASE
ORAL_TABLET | Freq: Every day | ORAL | 1 refills | 90.00 days
Start: 2023-03-09 — End: ?

## 2023-03-12 DIAGNOSIS — R509 Fever, unspecified: Principal | ICD-10-CM

## 2023-03-12 DIAGNOSIS — R059 Cough, unspecified type: Principal | ICD-10-CM

## 2023-03-12 DIAGNOSIS — J029 Acute pharyngitis, unspecified: Principal | ICD-10-CM

## 2023-03-12 NOTE — Unmapped (Signed)
[facility] ENT Encounter  This medical encounter was conducted virtually using [email or web address]  TeleHealth protocols.    Patient ID: Jamie Webb is a 33 y.o. female who presents by video interaction for evaluation.    I have identified myself to the patient and conveyed my credentials to [place].   Patient has signed informed consent on file in medical record.    Present on Video Call: Is there someone else in the room? No..    Assessment/Plan:    Diagnoses and all orders for this visit:    Cough, unspecified type  -     POCT SARS/FLU/RSV - RN OBTAIN; Future  -     POCT Rapid Strep A Screen - RN Obtain; Future    Fever, unspecified fever cause  -     POCT SARS/FLU/RSV - RN OBTAIN; Future  -     POCT Rapid Strep A Screen - RN Obtain; Future    ST (sore throat)  -     POCT Rapid Strep A Screen - RN Obtain; Future     Call pt if pos. Fluids, rest, Followup if worsening      -- Patient verbalized an understanding of today's assessment and recommendations, as well as the purpose of ongoing medications.    Other   Referred to [facility] for testing tomorrow.  To decide if strep or flu or other  Tylenol and rest tonight    Medication adherence and barriers to the treatment plan have been addressed. Opportunities to optimize healthy behaviors have been discussed. Patient / caregiver voiced understanding.        Subjective:     HPI  Jamie Webb is 34 y.o. and presents today in the [facility] with ENT symptoms.  The PCP for this patient is Mangel, Benison Pap, DO. Started2 days ago. Chills, fever up to 103. Has a bad ST, swollen node on left side of neck.  Mild cough. HA, throat is the worst part. It looks red but no white cOATING        ROS  Review of Systems     All other ROS per HPI.    I have reviewed the problem list, past medical history, past family history, medications, and allergies and have updated/reconciled them if needed.          Objective:   Physical Exam  As part of this Video Visit, no in-person exam was conducted.  Video interaction permitted the following observations.    General: No acute distress.   HEENT: Eyes: Sclera clear. No obvious swelling. No visible mass or abnormality of neck.   RESP: Relaxed respiratory effort. No conversational dyspnea.   SKIN: No rashes noted.  NEURO: Normal coordination.  No tremors observed.  PSYCH: Alert and oriented.  Speech fluent and sensible.  Calm affect.           The patient reports they are physically located in [place] and is currently: at home. I conducted a audio/video visit. I spent  86m 39s on the video call with the patient. I spent an additional 2 minutes on pre- and post-visit activities on the date of service .

## 2023-03-12 NOTE — Unmapped (Addendum)
-  Patient contacted on behalf of virtual practice provider regarding Urgent Care testing only appt scheduling     Resolution: Patient contacted and appt scheduled          ---- Message from Coralee Rud, [place] sent at 03/12/2023  7:23 PM EST -----  Regarding: testing  Hi , this pt needs testing at [facility] tomorrow. 4plex and strep. Thanks, Corrie Dandy

## 2023-03-13 ENCOUNTER — Ambulatory Visit: Admit: 2023-03-13 | Discharge: 2023-03-14 | Payer: PRIVATE HEALTH INSURANCE

## 2023-03-13 ENCOUNTER — Encounter: Admit: 2023-03-13 | Discharge: 2023-03-13 | Payer: PRIVATE HEALTH INSURANCE

## 2023-03-13 DIAGNOSIS — R509 Fever, unspecified: Principal | ICD-10-CM

## 2023-03-13 DIAGNOSIS — J020 Streptococcal pharyngitis: Principal | ICD-10-CM

## 2023-03-13 DIAGNOSIS — J029 Acute pharyngitis, unspecified: Principal | ICD-10-CM

## 2023-03-13 DIAGNOSIS — R059 Cough, unspecified type: Principal | ICD-10-CM

## 2023-03-13 MED ORDER — PENICILLIN V POTASSIUM 500 MG TABLET
ORAL_TABLET | Freq: Two times a day (BID) | ORAL | 0 refills | 10.00 days | Status: CP
Start: 2023-03-13 — End: ?

## 2023-03-13 MED ORDER — FLUCONAZOLE 150 MG TABLET
ORAL_TABLET | Freq: Once | ORAL | 1 refills | 1.00 days | Status: CP
Start: 2023-03-13 — End: 2023-03-13

## 2023-03-13 NOTE — Unmapped (Signed)
Strep test positive. Pcn sent into pharmacy.

## 2023-03-13 NOTE — Unmapped (Unsigned)
Patient seen by virtual care. Came in for rapid strep and 4Plex testing.

## 2023-03-26 ENCOUNTER — Encounter: Admit: 2023-03-26 | Discharge: 2023-03-27 | Payer: PRIVATE HEALTH INSURANCE | Attending: School | Primary: School
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
## 2023-03-28 NOTE — Unmapped (Signed)
[facility] Refill Coordination Note    Jamie Webb, DOB: 1990/03/21  Phone: [phone] (home)       All above HIPAA information was verified with patient.         03/28/2023    11:39 AM   Specialty Rx Medication Refill Questionnaire   Which Medications would you like refilled and shipped? omalizumab: XOLAIR 75 mg/0.5 mL syringe and  omalizumab: XOLAIR 150 mg/mL syringe.   Please list all current allergies: None   Have you missed any doses in the last 30 days? No   Have you had any changes to your medication(s) since your last refill? No   How many days remaining of each medication do you have at home? 0   If receiving an injectable medication, next injection date is 04/01/2023   Have you experienced any side effects in the last 30 days? No   Please enter the full address (street address, city, state, zip code) where you would like your medication(s) to be delivered to. [address] [place], [place] [address]   Please specify on which day you would like your medication(s) to arrive. Note: if you need your medication(s) within 3 days, please call the pharmacy to schedule your order at [phone]  03/29/2023   Has your insurance changed since your last refill? No   Would you like a pharmacist to call you to discuss your medication(s)? No   Do you require a signature for your package? (Note: if we are billing Medicare Part B or your order contains a controlled substance, we will require a signature) No         Completed refill call assessment today to schedule patient's medication shipment from the [facility] [phone]).  All relevant notes have been reviewed.       Confirmed patient received a Conservation officer, historic buildings and a Surveyor, mining with first shipment. The patient will receive a drug information handout for each medication shipped and additional FDA Medication Guides as required.         REFERRAL TO PHARMACIST     Referral to the pharmacist: Not needed      [facility]     Shipping address confirmed in Epic.     Delivery Scheduled: Yes, Expected medication delivery date: 03/29/23.     Medication will be delivered via Same Day Courier to the prescription address in Epic WAM.    Oliva Bustard, PharmD   [facility] Delivery Pharm[facility] Specialty Pharmacist

## 2023-03-29 MED FILL — XOLAIR 150 MG/ML SUBCUTANEOUS SYRINGE: SUBCUTANEOUS | 28 days supply | Qty: 2 | Fill #2

## 2023-03-29 MED FILL — XOLAIR 75 MG/0.5 ML SUBCUTANEOUS SYRINGE: SUBCUTANEOUS | 28 days supply | Qty: 1 | Fill #2

## 2023-03-31 ENCOUNTER — Encounter: Admit: 2023-03-31 | Discharge: 2023-04-01 | Payer: PRIVATE HEALTH INSURANCE

## 2023-03-31 DIAGNOSIS — J029 Acute pharyngitis, unspecified: Principal | ICD-10-CM

## 2023-03-31 MED ORDER — AMOXICILLIN 875 MG-POTASSIUM CLAVULANATE 125 MG TABLET
ORAL_TABLET | Freq: Two times a day (BID) | ORAL | 0 refills | 10.00 days | Status: CP
Start: 2023-03-31 — End: 2023-04-10

## 2023-03-31 NOTE — Unmapped (Signed)
 [facility] Encounter  This medical encounter was conducted virtually using [email or web address]  Tele[employer] protocols.    Patient ID: Jamie Webb is a 33 y.o. female who presents by video interaction for evaluation.    I have identified myself to the patient and conveyed my credentials to [place].   Patient has signed informed consent on file in medical record.    Present on Video Call: Is there someone else in the room? No.    Assessment/Plan:      Problem List Items Addressed This Visit          Other    Sore throat - Primary    Had strep throat about 2 weeks ago.  Tested positive 03/13/2023.  Took 10 days of PCN.  3-4 days after finishing abx, she started to have sore throat again.  Advised lets re-test for strep to be safe.  Will start Augmentin BID X 7 days.  Follow up pending results.  Patient verbalizes understanding and has no further questions at this time.         Relevant Medications    amoxicillin-clavulanate (AUGMENTIN) 875-125 mg per tablet    Other Relevant Orders    POCT Rapid Strep A Screen - RN Obtain     I provided an intervention for the Tobacco Use SDOH domain. The intervention was Education     -- Patient verbalized an understanding of today's assessment and recommendations, as well as the purpose of ongoing medications.    Follow-up with PCP    Medication adherence and barriers to the treatment plan have been addressed. Opportunities to optimize healthy behaviors have been discussed. Patient / caregiver voiced understanding.     Subjective:     HPI  Jamie Webb is 33 y.o. and presents today in the [facility] with symptoms.  The PCP for this patient is Mangel, Benison Pap, DO.     Had strep throat about 2 weeks ago.  Tested positive 03/13/2023.  Took 10 days of PCN.  3-4 days after finishing abx, she started to have sore throat again.    ROS  Review of Systems     All other ROS per HPI.    I have reviewed the problem list, past medical history, past family history, medications, and allergies and have updated/reconciled them if needed.     Objective:   Physical Exam  As part of this Video Visit, no in-person exam was conducted.  Video interaction permitted the following observations.    General: No acute distress.   HEENT:  EOMI.  No photophobia. No conjunctival injection.    RESP: Relaxed respiratory effort. No conversational dyspnea.   SKIN: No rashes noted.  NEURO: No tremors observed.  Normal speech.   PSYCH: Alert and oriented.  Speech fluent and sensible.  Calm affect.       The patient reports they are physically located in [place] and is currently: at home. I conducted a audio/video visit. I spent  64m 24s on the video call with the patient. I spent an additional 3 minutes on pre- and post-visit activities on the date of service .

## 2023-03-31 NOTE — Unmapped (Signed)
 Had strep throat about 2 weeks ago.  Tested positive 03/13/2023.  Took 10 days of PCN.  3-4 days after finishing abx, she started to have sore throat again.  Advised lets re-test for strep to be safe.  Will start Augmentin BID X 7 days.  Follow up pending results.  Patient verbalizes understanding and has no further questions at this time.

## 2023-03-31 NOTE — Unmapped (Addendum)
 Patient contacted on behalf of virtual practice provider regarding Urgent Care testing only appt scheduling     Resolution: Patient contacted and appt scheduled           ----- Message from Melba Coon, FNP sent at 03/31/2023  6:26 PM EST -----  Regarding: schedule swab  Please call patient to schedule a TEST ONLY appointment at the below [facility]:    [facility] at [facility] [place] [address], Page)       Test(s) ordered:   [id number] Strep PCR

## 2023-04-01 ENCOUNTER — Ambulatory Visit: Admit: 2023-04-01 | Discharge: 2023-04-02 | Payer: PRIVATE HEALTH INSURANCE

## 2023-04-01 DIAGNOSIS — J029 Acute pharyngitis, unspecified: Principal | ICD-10-CM

## 2023-04-08 ENCOUNTER — Encounter
Admit: 2023-04-08 | Discharge: 2023-04-09 | Payer: PRIVATE HEALTH INSURANCE | Attending: Psychiatric/Mental Health | Primary: Psychiatric/Mental Health

## 2023-04-08 DIAGNOSIS — F32A Anxiety and depression: Principal | ICD-10-CM

## 2023-04-08 DIAGNOSIS — F909 Attention-deficit hyperactivity disorder, unspecified type: Principal | ICD-10-CM

## 2023-04-08 DIAGNOSIS — F419 Anxiety disorder, unspecified: Principal | ICD-10-CM

## 2023-04-08 MED ORDER — FLUOXETINE 40 MG CAPSULE
ORAL_CAPSULE | Freq: Every day | ORAL | 0 refills | 90.00 days | Status: CP
Start: 2023-04-08 — End: 2023-07-07

## 2023-04-08 MED ORDER — HYDROXYZINE HCL 10 MG TABLET
ORAL_TABLET | Freq: Every day | ORAL | 0 refills | 90.00 days | Status: CP | PRN
Start: 2023-04-08 — End: ?

## 2023-04-08 MED ORDER — LISDEXAMFETAMINE 60 MG CAPSULE
ORAL_CAPSULE | Freq: Every morning | ORAL | 0 refills | 30.00 days | Status: CP
Start: 2023-04-08 — End: 2023-05-07

## 2023-04-08 MED ORDER — DEXTROAMPHETAMINE-AMPHETAMINE 20 MG TABLET
ORAL_TABLET | Freq: Every day | ORAL | 0 refills | 30.00 days | Status: CP
Start: 2023-04-08 — End: 2023-05-08
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
## 2023-04-09 ENCOUNTER — Encounter: Admit: 2023-04-09 | Discharge: 2023-04-10 | Payer: PRIVATE HEALTH INSURANCE | Attending: School | Primary: School
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
## 2023-04-22 ENCOUNTER — Ambulatory Visit
Admit: 2023-04-22 | Discharge: 2023-04-23 | Payer: PRIVATE HEALTH INSURANCE | Attending: Student in an Organized Health Care Education/Training Program | Primary: Student in an Organized Health Care Education/Training Program

## 2023-04-22 DIAGNOSIS — F909 Attention-deficit hyperactivity disorder, unspecified type: Principal | ICD-10-CM

## 2023-04-22 MED ORDER — BUDESONIDE-FORMOTEROL HFA 160 MCG-4.5 MCG/ACTUATION AEROSOL INHALER
Freq: Two times a day (BID) | RESPIRATORY_TRACT | 0 refills | 31.00 days | Status: CP
Start: 2023-04-22 — End: 2024-04-21

## 2023-04-22 MED ORDER — LISDEXAMFETAMINE 60 MG CAPSULE
ORAL_CAPSULE | Freq: Every morning | ORAL | 0 refills | 30.00 days | Status: CP
Start: 2023-04-22 — End: 2023-05-21
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
## 2023-04-22 NOTE — Unmapped (Addendum)
 Hello [name],    It was great meeting you today. Please see our plan from today below:  - START Symbicort  inhaler with spacer (instead of your albuterol) as needed  - Continue Xolair injections  - Please call the [facility] at [phone] to set up an appointment.  - Please call [phone] to schedule an appointment with the smoking cessation clinic.   - Please also follow up with your ear nose throat physicians about the sinus surgery    Please return to clinic in 6 months.     If you have any non-urgent questions or concerns please reach out by calling the clinic [phone]) or via MyChart.    Vona Whiters Griffith Citron, DO  Pulmonary and Critical Care Fellow

## 2023-04-22 NOTE — Unmapped (Signed)
 Patient called and stated that her pharmacy is out of the Vyvanse and would like the prescription to be sent to [facility] in [place].

## 2023-04-23 MED ORDER — DEXTROAMPHETAMINE-AMPHETAMINE 20 MG TABLET
ORAL_TABLET | Freq: Every day | ORAL | 0 refills | 30.00 days | Status: CP
Start: 2023-04-23 — End: 2023-05-23

## 2023-04-26 ENCOUNTER — Encounter: Admit: 2023-04-26 | Discharge: 2023-04-27 | Payer: PRIVATE HEALTH INSURANCE | Attending: School | Primary: School
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
## 2023-04-29 NOTE — Unmapped (Signed)
 I saw and evaluated the patient, participating in the key portions of the service.  I reviewed the resident???s note.  I agree with the resident???s findings and plan. Jessy Oto, MD

## 2023-04-30 MED FILL — XOLAIR 75 MG/0.5 ML SUBCUTANEOUS SYRINGE: SUBCUTANEOUS | 28 days supply | Qty: 1 | Fill #3

## 2023-04-30 MED FILL — XOLAIR 150 MG/ML SUBCUTANEOUS SYRINGE: SUBCUTANEOUS | 28 days supply | Qty: 2 | Fill #3

## 2023-04-30 NOTE — Unmapped (Signed)
 [facility] Specialty and Home Delivery Pharmacy Refill Coordination Note    Jamie Webb, DOB: 1990-11-06  Phone: [phone] (home)       All above HIPAA information was verified with patient.         04/29/2023     9:25 PM   Specialty Rx Medication Refill Questionnaire   Which Medications would you like refilled and shipped? omalizumab: XOLAIR 75 mg/0.5 mL syringe and  omalizumab: XOLAIR 150 mg/mL syringe.   Please list all current allergies: None   Have you missed any doses in the last 30 days? No   Have you had any changes to your medication(s) since your last refill? No   How many days remaining of each medication do you have at home? 0   If receiving an injectable medication, next injection date is 04/29/2023   Have you experienced any side effects in the last 30 days? No   Please enter the full address (street address, city, state, zip code) where you would like your medication(s) to be delivered to. [address] [place]. [place] [address]   Please specify on which day you would like your medication(s) to arrive. Note: if you need your medication(s) within 3 days, please call the pharmacy to schedule your order at [phone]  04/30/2023   Has your insurance changed since your last refill? No   Would you like a pharmacist to call you to discuss your medication(s)? No   Do you require a signature for your package? (Note: if we are billing Medicare Part B or your order contains a controlled substance, we will require a signature) No   I have been provided my out of pocket cost for my medication and approve the pharmacy to charge the amount to my credit card on file. Yes         Completed refill call assessment today to schedule patient's medication shipment from the [facility] and Home Delivery Pharmacy [phone]).  All relevant notes have been reviewed.       Confirmed patient received a Conservation officer, historic buildings and a Surveyor, mining with first shipment. The patient will receive a drug information handout for each medication shipped and additional FDA Medication Guides as required.         REFERRAL TO PHARMACIST     Referral to the pharmacist: Not needed      [facility]     Shipping address confirmed in Epic.     Delivery Scheduled: Yes, Expected medication delivery date: 04/30/23 .     Medication will be delivered via Same Day Courier to the prescription address in Epic WAM.    Ricci Barker   [facility] Specialty and Home Delivery Pharmacy Specialty Technician

## 2023-05-07 ENCOUNTER — Encounter: Admit: 2023-05-07 | Discharge: 2023-05-08 | Payer: PRIVATE HEALTH INSURANCE

## 2023-05-07 DIAGNOSIS — J302 Other seasonal allergic rhinitis: Principal | ICD-10-CM

## 2023-05-07 MED ORDER — AZELASTINE 137 MCG (0.1 %) NASAL SPRAY
Freq: Two times a day (BID) | NASAL | 3 refills | 109 days | Status: CP
Start: 2023-05-07 — End: 2024-05-06

## 2023-05-07 NOTE — Unmapped (Signed)
 [facility] ENT Encounter  This medical encounter was conducted virtually using [email or web address]  [email or web address] protocols.    Patient ID: Jamie Webb is a 33 y.o. female who presents by video interaction for evaluation.    I have identified myself to the patient and conveyed my credentials to Jamie Webb.   Patient has signed informed consent on file in medical record.    Present on Video Call: Is there someone else in the room? No..    Assessment/Plan:    Diagnoses and all orders for this visit:    Seasonal allergic rhinitis, unspecified trigger  -     azelastine (ASTELIN) 137 mcg (0.1 %) nasal spray; 1 spray into each nostril two (2) times a day. Use in each nostril as directed     Suspect viral and allergic laryngitis/uri  Continue inhalers and allergy med.   Followup if worsening      -- Discussed the new prescription noted above, including potential side effects, drug interactions, instructions for taking the medication, and the consequences of not taking it.  -- Patient verbalized an understanding of today's assessment and recommendations, as well as the purpose of ongoing medications.    Follow-up as Needed         Medication adherence and barriers to the treatment plan have been addressed. Opportunities to optimize healthy behaviors have been discussed. Patient / caregiver voiced understanding.        Subjective:     HPI  Jamie Webb is 33 y.o. and presents today in the [facility] with ENT symptoms.  The PCP for this patient is Mangel, Benison Pap, DO. Started 3-4 days ago. Voice is affected. No fever, some sore throat. Not much congestion. Has allergies. Has asthma, not a flair. Dry cough.         ROS  Review of Systems     All other ROS per HPI.    I have reviewed the problem list, past medical history, past family history, medications, and allergies and have updated/reconciled them if needed.          Objective:   Physical Exam  As part of this Video Visit, no in-person exam was conducted.  Video interaction permitted the following observations.    General: No acute distress.   HEENT: Eyes: Sclera clear. No obvious swelling. No visible mass or abnormality of neck.   RESP: Relaxed respiratory effort. No conversational dyspnea.   SKIN: No rashes noted.  NEURO: Normal coordination.  No tremors observed.  PSYCH: Alert and oriented.  Speech fluent and sensible.  Calm affect.           The patient reports they are physically located in [place] and is currently: at home. I conducted a audio/video visit. I spent  58m 08s on the video call with the patient. I spent an additional 2 minutes on pre- and post-visit activities on the date of service .

## 2023-05-08 MED ORDER — LISDEXAMFETAMINE 60 MG CAPSULE
ORAL_CAPSULE | Freq: Every morning | ORAL | 0 refills | 30.00 days | Status: CP
Start: 2023-05-08 — End: 2023-06-06

## 2023-05-08 MED ORDER — DEXTROAMPHETAMINE-AMPHETAMINE 20 MG TABLET
ORAL_TABLET | Freq: Every day | ORAL | 0 refills | 30.00000 days | Status: CP
Start: 2023-05-08 — End: 2023-06-07

## 2023-05-10 NOTE — Unmapped (Signed)
 Outreach Call: Clinician noticed patient cancelled today's schedule appointment and reached out to see if patient wanted to reschedule.   Clinician left a voicemail as well as sent a MyChart message.

## 2023-05-15 MED ORDER — GABAPENTIN 300 MG CAPSULE
ORAL_CAPSULE | Freq: Every evening | ORAL | 0 refills | 90 days | Status: CP | PRN
Start: 2023-05-15 — End: 2023-08-13

## 2023-05-29 NOTE — Unmapped (Signed)
 [facility] Specialty and Home Delivery Pharmacy Refill Coordination Note    Jamie Webb, DOB: 12-25-1990  Phone: [phone] (home)       All above HIPAA information was verified with patient.05/29/2023 - Clinical assessment date was due on 04/28. Reached out to AD and got approval to proceed with scheduling the patients refill and to move the clinical assessment date to match the next refill coordination.         05/28/2023     5:49 PM   Specialty Rx Medication Refill Questionnaire   Which Medications would you like refilled and shipped? Xolair  150 mg and Xolair  75 mg   Please list all current allergies: None   Have you missed any doses in the last 30 days? Yes   If Yes, how many doses have you missed ? 0-2   Have you had any changes to your medication(s) since your last refill? No   How many days remaining of each medication do you have at home? 0   If receiving an injectable medication, next injection date is 05/27/2023   Have you experienced any side effects in the last 30 days? No   Please enter the full address (street address, city, state, zip code) where you would like your medication(s) to be delivered to. [address] [place], [place], [address], [place]    Please specify on which day you would like your medication(s) to arrive. Note: if you need your medication(s) within 3 days, please call the pharmacy to schedule your order at [phone]  05/28/2023   Has your insurance changed since your last refill? No   Would you like a pharmacist to call you to discuss your medication(s)? No   Do you require a signature for your package? (Note: if we are billing Medicare Part B or your order contains a controlled substance, we will require a signature) No   I have been provided my out of pocket cost for my medication and approve the pharmacy to charge the amount to my credit card on file. Yes         Completed refill call assessment today to schedule patient's medication shipment from the [facility] and Home Delivery Pharmacy [phone]).  All relevant notes have been reviewed.       Confirmed patient received a Conservation officer, historic buildings and a Surveyor, mining with first shipment. The patient will receive a drug information handout for each medication shipped and additional FDA Medication Guides as required.         REFERRAL TO PHARMACIST     Referral to the pharmacist: Not needed      [facility]     Shipping address confirmed in Epic.     Delivery Scheduled: Yes, Expected medication delivery date: 05/29/23.     Medication will be delivered via Same Day Courier to the prescription address in Epic WAM.    Kimm Sider   [facility] and Home Delivery Pharmacy Specialty Technician

## 2023-05-30 DIAGNOSIS — J4551 Severe persistent asthma with (acute) exacerbation: Principal | ICD-10-CM

## 2023-05-30 NOTE — Unmapped (Addendum)
 Jamie Webb 's Xolair  shipment will be sent out as a result of prior authorization now approved.     I have reached out to the patient  via at [phone]  and communicated the delay. We will reschedule the medication for the delivery date that the patient agreed upon.  We have confirmed the delivery date as 4/18, via same day courier.

## 2023-05-31 MED FILL — XOLAIR 150 MG/ML SUBCUTANEOUS SYRINGE: SUBCUTANEOUS | 28 days supply | Qty: 2 | Fill #4

## 2023-05-31 MED FILL — XOLAIR 75 MG/0.5 ML SUBCUTANEOUS SYRINGE: SUBCUTANEOUS | 28 days supply | Qty: 1 | Fill #4

## 2023-06-07 MED ORDER — LISDEXAMFETAMINE 60 MG CAPSULE
ORAL_CAPSULE | Freq: Every morning | ORAL | 0 refills | 30.00 days | Status: CP
Start: 2023-06-07 — End: 2023-07-06

## 2023-06-07 MED ORDER — DEXTROAMPHETAMINE-AMPHETAMINE 20 MG TABLET
ORAL_TABLET | Freq: Every day | ORAL | 0 refills | 30.00 days | Status: CP
Start: 2023-06-07 — End: 2023-07-07

## 2023-06-19 DIAGNOSIS — J4551 Severe persistent asthma with (acute) exacerbation: Principal | ICD-10-CM

## 2023-06-19 MED ORDER — XOLAIR 150 MG/ML SUBCUTANEOUS SYRINGE
SUBCUTANEOUS | 4 refills | 28.00000 days
Start: 2023-06-19 — End: ?

## 2023-06-19 MED ORDER — XOLAIR 75 MG/0.5 ML SUBCUTANEOUS SYRINGE
SUBCUTANEOUS | 4 refills | 28.00000 days
Start: 2023-06-19 — End: ?

## 2023-06-20 MED ORDER — XOLAIR 75 MG/0.5 ML SUBCUTANEOUS SYRINGE
SUBCUTANEOUS | 4 refills | 28.00000 days | Status: CP
Start: 2023-06-20 — End: ?

## 2023-06-20 MED ORDER — XOLAIR 150 MG/ML SUBCUTANEOUS SYRINGE
SUBCUTANEOUS | 4 refills | 28.00000 days | Status: CP
Start: 2023-06-20 — End: ?
  Filled 2023-06-27: qty 2, 28d supply, fill #0

## 2023-06-22 ENCOUNTER — Ambulatory Visit
Admission: EM | Admit: 2023-06-22 | Discharge: 2023-06-22 | Disposition: A | Discharge status: Home / Self Care | Attending: Emergency Medicine | Admitting: Emergency Medicine

## 2023-06-22 ENCOUNTER — Encounter: Payer: Self-pay | Admitting: Emergency Medicine

## 2023-06-22 DIAGNOSIS — K047 Periapical abscess without sinus: Secondary | ICD-10-CM

## 2023-06-22 MED ORDER — SYMBICORT 160 MCG-4.5 MCG/ACTUATION HFA AEROSOL INHALER
0 refills | 0.00000 days
Start: 2023-06-22 — End: ?

## 2023-06-22 MED ORDER — AMOXICILLIN-POT CLAVULANATE 875-125 MG PO TABS: 1.0000 | ORAL_TABLET | Freq: Two times a day (BID) | ORAL | 0 refills | Status: AC

## 2023-06-22 NOTE — ED Triage Notes (Signed)
 Pt c/o right sides jaw pain and swelling. She states she started having pain last night but the swelling started this morning. She states her jaw has been hurting intermittently for the last 2 weeks. She has known TMJ but has never had swelling like this with it.
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
## 2023-06-22 NOTE — Discharge Instructions (Addendum)
 Take the Augmentin twice daily with food for 7 days for treatment of your dental infection.  Use over-the-counter Tylenol  and ibuprofen  for swelling and mild to moderate pain.  Rinse with warm salt water, or Listerine, after each meal to remove food particles and wash away any pus that is collecting.  May also use food grade clove oil and apply directly to the teeth that are in pain as it works like a local anesthetic.  If you are unable to find food grade clove oil you may place a hole clove inside of your cheek and let the oils be absorbed of the tissues in your mouth.  You may also use a state clove teabag and park it in your cheek between your teeth and your cheek wall to achieve the same effect.  If you develop any increasing or swelling, fever, pain, or difficulty swallowing you to go to the emergency department at [facility] with a have an oral surgeon and also a dentist on-call.

## 2023-06-24 MED ORDER — SYMBICORT 160 MCG-4.5 MCG/ACTUATION HFA AEROSOL INHALER
RESPIRATORY_TRACT | 6 refills | 0.00000 days | Status: CP
Start: 2023-06-24 — End: ?

## 2023-06-24 NOTE — Unmapped (Signed)
 Requested Prescriptions     Pending Prescriptions Disp Refills    SYMBICORT 160-4.5 mcg/actuation inhaler [Pharmacy Med Name: SYMBICORT 160-4.5 MCG INHALER]  0     Sig: INHALE 2 PUFFS TWO TIMES A DAY.     Patient was last seen 04/22/2023  No follow up scheduled at this time

## 2023-06-25 NOTE — Unmapped (Signed)
 [facility] Clinical Assessment & Refill Coordination Note    Jamie Webb, DOB: [birth year]  Phone: [phone] (home)     All above HIPAA information was verified with patient.     Was a Nurse, learning disability used for this call? No    Specialty Medication(s):   CF/Pulmonary/Asthma: Xolair     Current Outpatient Medications   Medication Sig Dispense Refill    albuterol HFA 90 mcg/actuation inhaler Inhale 2 puffs two (2) times a day as needed for wheezing. 18 g 5    azelastine (ASTELIN) 137 mcg (0.1 %) nasal spray 1 spray into each nostril two (2) times a day. Use in each nostril as directed 30 mL 3    budesonide-formoterol (SYMBICORT) 160-4.5 mcg/actuation inhaler INHALE 2 PUFFS TWO TIMES A DAY. 10.2 g 6    dextroamphetamine-amphetamine (ADDERALL) 20 mg tablet Take 1 tablet (20 mg total) by mouth daily. 30 tablet 0    empty container Misc USE AS DIRECTED 1 each 0    EPINEPHrine (EPIPEN) 0.3 mg/0.3 mL injection Inject 0.3 mL (0.3 mg total) into the muscle once for 1 dose AS INSTRUCTED FOR ANAPHYLAXIS 2 each 0    famotidine (PEPCID) 20 MG tablet Take 1 tablet (20 mg total) by mouth daily. 30 tablet 1    fluoride, sodium, 1.1 % Pste       FLUoxetine (PROZAC) 40 MG capsule Take 2 capsules (80 mg total) by mouth daily. 180 capsule 0    fluticasone propionate (FLONASE) 50 mcg/actuation nasal spray 2 sprays into each nostril daily. 16 g 0    gabapentin (NEURONTIN) 300 MG capsule TAKE 1 CAPSULE (300 MG TOTAL) BY MOUTH NIGHTLY AS NEEDED (RESTLESS LEGS). 90 capsule 0    hydrOXYzine (ATARAX) 10 MG tablet Take 1 tablet (10 mg total) by mouth daily as needed for anxiety. 90 tablet 0    lisdexamfetamine (VYVANSE) 60 MG cap capsule Take 1 capsule (60 mg total) by mouth every morning. 30 capsule 0    omalizumab (XOLAIR) 150 mg/mL syringe Inject the contents of 1 syringe (150 mg total) under the skin every fourteen (14) days. Inject along with 75 mg syringe for total dose of 225 mg every 14 days. 2 mL 4 omalizumab (XOLAIR) 75 mg/0.5 mL syringe Inject the contents of 1 syringe (75 mg total) under the skin every fourteen (14) days. Inject along with 150 mg syringe for total dose of 225 mg every 14 days. 1 mL 4    pantoprazole (PROTONIX) 40 MG tablet Take 1 tablet (40 mg total) by mouth daily. 30 tablet 0     No current facility-administered medications for this visit.        Changes to medications: [name] reports no changes at this time.    Medication list has been reviewed and updated in Epic: Yes    No Known Allergies    Changes to allergies: No    Allergies have been reviewed and updated in Epic: Yes    SPECIALTY MEDICATION ADHERENCE     Xolair 75 mg/0.5mL: 0 doses of medicine on hand   Xolair 150 mg/ml: 0 doses of medicine on hand     Medication Adherence    Patient reported X missed doses in the last month: 0  Specialty Medication: Xolair 225mg  Q14d  Patient is on additional specialty medications: No  Patient is on more than two specialty medications: No          Specialty medication(s) dose(s) confirmed: Regimen is correct and unchanged.  Are there any concerns with adherence? No    Adherence counseling provided? Not needed    CLINICAL MANAGEMENT AND INTERVENTION      Clinical Benefit Assessment:    Do you feel the medicine is effective or helping your condition? Yes    Clinical Benefit counseling provided? Not needed    Adverse Effects Assessment:    Are you experiencing any side effects? No    Are you experiencing difficulty administering your medicine? No    Quality of Life Assessment:    Quality of Life    Rheumatology  Oncology  Dermatology  Cystic Fibrosis          How many days over the past month did your severe persistent asthma  keep you from your normal activities? For example, brushing your teeth or getting up in the morning. Patient declined to answer    Have you discussed this with your provider? Not needed    Acute Infection Status:    Acute infections noted within Epic:  No active infections    Patient reported infection: None    Therapy Appropriateness:    Is therapy appropriate based on current medication list, adverse reactions, adherence, clinical benefit and progress toward achieving therapeutic goals? Yes, therapy is appropriate and should be continued     Clinical Intervention:    Was an intervention completed as part of this clinical assessment? No    DISEASE/MEDICATION-SPECIFIC INFORMATION      For patients on injectable medications: Patient currently has 0 doses left.  Next injection is scheduled for 5/16.    Asthma/COPD: Have you had an asthma exacerbation in the last 30 days? No  Have you needed to use your rescue inhaler more often than usual in the last 30 days? No  Have you needed to take steroids for your asthma in the last 30 days? No    PATIENT SPECIFIC NEEDS     Does the patient have any physical, cognitive, or cultural barriers? No    Is the patient high risk? No    Does the patient require physician intervention or other additional services (i.e., nutrition, smoking cessation, social work)? No    Does the patient have an additional or emergency contact listed in their chart? Yes    SOCIAL DETERMINANTS OF HEALTH     At the [facility], we have learned that life circumstances - like trouble affording food, housing, utilities, or transportation can affect the health of many of our patients.   That is why we wanted to ask: are you currently experiencing any life circumstances that are negatively impacting your health and/or quality of life? Patient declined to answer    Social Drivers of Health     Food Insecurity: No Food Insecurity (01/08/2022)    Hunger Vital Sign     Worried About Running Out of Food in the Last Year: Never true     Ran Out of Food in the Last Year: Never true   Tobacco Use: High Risk (05/07/2023)    Patient History     Smoking Tobacco Use: Every Day     Smokeless Tobacco Use: Never     Passive Exposure: Not on file   Transportation Needs: No Transportation Needs (01/08/2022)    PRAPARE - Transportation     Lack of Transportation (Medical): No     Lack of Transportation (Non-Medical): No   Alcohol Use: Not At Risk (10/03/2020)    Alcohol Use     How often do you  have a drink containing alcohol?: Monthly or less     How many drinks containing alcohol do you have on a typical day when you are drinking?: 1 - 2     How often do you have 5 or more drinks on one occasion?: Never   Housing: Not on file   Physical Activity: Sufficiently Active (10/03/2020)    Exercise Vital Sign     Days of Exercise per Week: 5 days     Minutes of Exercise per Session: 60 min   Utilities: Not on file   Stress: No Stress Concern Present (01/08/2022)    [employer] of Occupational Health - Occupational Stress Questionnaire     Feeling of Stress : Not at all   Interpersonal Safety: Not At Risk (12/05/2021)    Interpersonal Safety     Unsafe Where You Currently Live: No     Physically Hurt by Anyone: No     Abused by Anyone: No   Substance Use: Not on file (12/18/2022)   Intimate Partner Violence: Not At Risk (10/03/2020)    Humiliation, Afraid, Rape, and Kick questionnaire     Fear of Current or Ex-Partner: No     Emotionally Abused: No     Physically Abused: No     Sexually Abused: No   Social Connections: Socially Integrated (10/03/2020)    Social Connection and Isolation Panel [NHANES]     Frequency of Communication with Friends and Family: More than three times a week     Frequency of Social Gatherings with Friends and Family: More than three times a week     Attends Religious Services: 1 to 4 times per year     Active Member of [employer] or Organizations: No     Attends Engineer, structural: 1 to 4 times per year     Marital Status: Living with partner   Physicist, medical Strain: Low Risk  (01/08/2022)    Overall Financial Resource Strain (CARDIA)     Difficulty of Paying Living Expenses: Not hard at all   Health Literacy: Low Risk  (10/03/2020)    Health Literacy     : Never   Internet Connectivity: No Internet connectivity concern identified (12/05/2021)    Internet Connectivity     Do you have access to internet services: Yes     How do you connect to the internet: Personal Device at home     Is your internet connection strong enough for you to watch video on your device without major problems?: Yes     Do you have enough data to get through the month?: Yes     Does at least one of the devices have a camera that you can use for video chat?: Yes       Would you be willing to receive help with any of the needs that you have identified today? Not applicable       SHIPPING     Specialty Medication(s) to be Shipped:   CF/Pulmonary/Asthma: Xolair    Other medication(s) to be shipped: No additional medications requested for fill at this time     Changes to insurance: No    Cost and Payment: Patient has a $0 copay, payment information is not required.    Delivery Scheduled: Yes, Expected medication delivery date: 06/27/23.     Medication will be delivered via Same Day Courier to the confirmed prescription address in [facility].    The patient will receive a drug information handout  for each medication shipped and additional FDA Medication Guides as required.  Verified that patient has previously received a Conservation officer, historic buildings and a Surveyor, mining.    The patient or caregiver noted above participated in the development of this care plan and knows that they can request review of or adjustments to the care plan at any time.      All of the patient's questions and concerns have been addressed.    Joseph Nickel, PharmD   [facility] Specialty Pharmacist

## 2023-06-27 MED FILL — XOLAIR 75 MG/0.5 ML SUBCUTANEOUS SYRINGE: SUBCUTANEOUS | 28 days supply | Qty: 1 | Fill #0

## 2023-07-01 ENCOUNTER — Encounter
Admit: 2023-07-01 | Discharge: 2023-07-02 | Payer: Medicaid (Managed Care) | Attending: Psychiatric/Mental Health | Primary: Psychiatric/Mental Health

## 2023-07-01 DIAGNOSIS — F419 Anxiety disorder, unspecified: Principal | ICD-10-CM

## 2023-07-01 DIAGNOSIS — F909 Attention-deficit hyperactivity disorder, unspecified type: Principal | ICD-10-CM

## 2023-07-01 DIAGNOSIS — F32A Anxiety and depression: Principal | ICD-10-CM

## 2023-07-01 MED ORDER — DEXTROAMPHETAMINE-AMPHETAMINE 20 MG TABLET
ORAL_TABLET | Freq: Every day | ORAL | 0 refills | 30.00000 days | Status: CP
Start: 2023-07-01 — End: 2023-07-31

## 2023-07-01 MED ORDER — FLUOXETINE 40 MG CAPSULE
ORAL_CAPSULE | Freq: Every day | ORAL | 0 refills | 90.00000 days | Status: CP
Start: 2023-07-01 — End: 2023-09-29

## 2023-07-01 MED ORDER — HYDROXYZINE HCL 10 MG TABLET
ORAL_TABLET | Freq: Every day | ORAL | 0 refills | 90.00000 days | Status: CP | PRN
Start: 2023-07-01 — End: ?

## 2023-07-01 MED ORDER — LISDEXAMFETAMINE 60 MG CAPSULE
ORAL_CAPSULE | Freq: Every morning | ORAL | 0 refills | 30.00000 days | Status: CP
Start: 2023-07-01 — End: 2023-07-30
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
## 2023-07-24 NOTE — Unmapped (Signed)
 Jamie Webb    Specialty Medication(s) to be Shipped:   Jamie Webb: Jamie Webb    Other medication(s) to be shipped: No additional medications requested for fill at this time     Jamie Webb, DOB: 12-Oct-1990  Phone: [phone] (home)       All above HIPAA information was verified with patient.     Was a Nurse, learning disability used for this call? No    Completed refill call assessment today to schedule patient's medication shipment from the [facility] and Home Delivery [facility]  [phone]).  All relevant notes have been reviewed.     Specialty medication(s) and dose(s) confirmed: Regimen is correct and unchanged.   Changes to medications: Jamie Webb reports no changes at this time.  Changes to insurance: No  New side effects reported not previously addressed with a pharmacist or physician: None reported  Questions for the pharmacist: No    Confirmed patient received a Conservation officer, historic buildings and a Surveyor, mining with first shipment. The patient will receive a drug information handout for each medication shipped and additional FDA Medication Guides as required.       DISEASE/MEDICATION-SPECIFIC INFORMATION        For patients on injectable medications: Patient currently has 0 doses left.  Next injection is scheduled for 06/12 or 06/13.    SPECIALTY MEDICATION ADHERENCE     Medication Adherence    Patient reported X missed doses in the last month: 0  Specialty Medication: XOLAIR Webb mg/mL syringe (omalizumab)  Patient is on additional specialty medications: Yes  Additional Specialty Medications: XOLAIR 75 mg/0.5 mL syringe (omalizumab)  Patient Reported Additional Medication X Missed Doses in the Last Month: 0  Patient is on more than two specialty medications: No              Were doses missed due to medication being on hold? No    XOLAIR Webb mg/mL syringe (omalizumab) : 0 doses of medicine on hand   XOLAIR 75 mg/0.5 mL syringe (omalizumab) : 0 doses of medicine on hand       REFERRAL TO PHARMACIST     Referral to the pharmacist: Not needed      SHIPPING     Shipping address confirmed in Epic.     Cost and Payment: Patient has a copay of $8. They are aware and have authorized the Webb to charge the credit card on file.    Delivery Scheduled: Yes, Expected medication delivery date: 06/12.     Medication will be delivered via Same Day Courier to the prescription address in Epic WAM.    Jamie Webb   Jamie Webb  Specialty Technician

## 2023-07-25 MED FILL — XOLAIR 75 MG/0.5 ML SUBCUTANEOUS SYRINGE: SUBCUTANEOUS | 28 days supply | Qty: 1 | Fill #1

## 2023-07-25 MED FILL — XOLAIR 150 MG/ML SUBCUTANEOUS SYRINGE: SUBCUTANEOUS | 28 days supply | Qty: 2 | Fill #1

## 2023-08-18 MED ORDER — GABAPENTIN 300 MG CAPSULE
ORAL_CAPSULE | Freq: Every evening | ORAL | 0 refills | 0.00000 days | PRN
Start: 2023-08-18 — End: ?

## 2023-08-19 MED ORDER — GABAPENTIN 300 MG CAPSULE
ORAL_CAPSULE | Freq: Every evening | ORAL | 0 refills | 90.00000 days | Status: CP | PRN
Start: 2023-08-19 — End: 2023-11-17

## 2023-08-21 NOTE — Unmapped (Signed)
 [facility] Specialty and Home Delivery Pharmacy Refill Coordination Note    Yanelli Zapanta, DOB: September 14, 1990  Phone: [phone] (home)       All above HIPAA information was verified with patient.         08/21/2023     1:34 PM   Specialty Rx Medication Refill Questionnaire   Which Medications would you like refilled and shipped? Xolair    Please list all current allergies: None   Have you missed any doses in the last 30 days? No   Have you had any changes to your medication(s) since your last refill? No   How much of each medication do you have remaining at home? (eg. number of tablets, injections, etc.) 0   If receiving an injectable medication, next injection date is 08/23/2023   Have you experienced any side effects in the last 30 days? No   Please enter the full address (street address, city, state, zip code) where you would like your medication(s) to be delivered to. [address] [place], [place] [address]   Please specify on which day you would like your medication(s) to arrive. Note: if you need your medication(s) within 3 days, please call the pharmacy to schedule your order at [phone]  08/22/2023   Has your insurance changed since your last refill? No   Would you like a pharmacist to call you to discuss your medication(s)? No   Do you require a signature for your package? (Note: if we are billing Medicare Part B or your order contains a controlled substance, we will require a signature) No   I have been provided my out of pocket cost for my medication and approve the pharmacy to charge the amount to my credit card on file. Yes         Completed refill call assessment today to schedule patient's medication shipment from the [facility] and [facility] Delivery Pharmacy [phone]).  All relevant notes have been reviewed.       Confirmed patient received a Conservation officer, historic buildings and a Surveyor, mining with first shipment. The patient will receive a drug information handout for each medication shipped and additional FDA Medication Guides as required.         REFERRAL TO PHARMACIST     Referral to the pharmacist: Not needed      [facility]     Shipping address confirmed in Epic.     Delivery Scheduled: Yes, Expected medication delivery date: 07/10.     Medication will be delivered via Same Day Courier to the prescription address in Epic WAM.    Esther Broyles   [place] [facility] Specialty Technician

## 2023-08-22 MED FILL — XOLAIR 150 MG/ML SUBCUTANEOUS SYRINGE: SUBCUTANEOUS | 28 days supply | Qty: 2 | Fill #2

## 2023-08-22 MED FILL — XOLAIR 75 MG/0.5 ML SUBCUTANEOUS SYRINGE: SUBCUTANEOUS | 28 days supply | Qty: 1 | Fill #2

## 2023-09-13 NOTE — Unmapped (Signed)
 [facility] Specialty and Home Delivery Pharmacy Refill Coordination Note    Jamie Webb, DOB: 1990-07-18  Phone: [phone] (home)       All above HIPAA information was verified with patient.         09/13/2023     1:47 PM   Specialty Rx Medication Refill Questionnaire   Which Medications would you like refilled and shipped? Xolair    Please list all current allergies: None   Have you missed any doses in the last 30 days? No   Have you had any changes to your medication(s) since your last refill? No   How much of each medication do you have remaining at home? (eg. number of tablets, injections, etc.) 0   If receiving an injectable medication, next injection date is 09/19/2023   Have you experienced any side effects in the last 30 days? No   Please enter the full address (street address, city, state, zip code) where you would like your medication(s) to be delivered to. [address] [place], [place] [address]   Please specify on which day you would like your medication(s) to arrive. Note: if you need your medication(s) within 3 days, please call the pharmacy to schedule your order at [phone]  09/16/2023   Has your insurance changed since your last refill? No   Would you like a pharmacist to call you to discuss your medication(s)? No   Do you require a signature for your package? (Note: if we are billing Medicare Part B or your order contains a controlled substance, we will require a signature) No   I have been provided my out of pocket cost for my medication and approve the pharmacy to charge the amount to my credit card on file. Yes         Completed refill call assessment today to schedule patient's medication shipment from the [facility] and Home Delivery Pharm[facility] [phone]).  All relevant notes have been reviewed.       Confirmed patient received a Conservation officer, historic buildings and a Surveyor, mining with first shipment. The patient will receive a drug information handout for each medication shipped and additional FDA Medication Guides as required.         REFERRAL TO PHARMACIST     Referral to the pharmacist: Not needed      [facility]     Shipping address confirmed in Epic.     Delivery Scheduled: Yes, Expected medication delivery date: 08/04.     Medication will be delivered via Same Day Courier to the prescription address in Epic WAM.    Jamie Webb   [place] [facility] Specialty Technician

## 2023-09-16 MED FILL — XOLAIR 75 MG/0.5 ML SUBCUTANEOUS SYRINGE: SUBCUTANEOUS | 28 days supply | Qty: 1 | Fill #3

## 2023-09-16 MED FILL — XOLAIR 150 MG/ML SUBCUTANEOUS SYRINGE: SUBCUTANEOUS | 28 days supply | Qty: 2 | Fill #3

## 2023-09-24 DIAGNOSIS — F419 Anxiety disorder, unspecified: Principal | ICD-10-CM

## 2023-09-24 DIAGNOSIS — F909 Attention-deficit hyperactivity disorder, unspecified type: Principal | ICD-10-CM

## 2023-09-24 DIAGNOSIS — F32A Anxiety and depression: Principal | ICD-10-CM

## 2023-09-24 MED ORDER — HYDROXYZINE HCL 10 MG TABLET
ORAL_TABLET | Freq: Every day | ORAL | 0 refills | 90.00000 days | Status: CP | PRN
Start: 2023-09-24 — End: ?

## 2023-09-24 MED ORDER — LISDEXAMFETAMINE 60 MG CAPSULE
ORAL_CAPSULE | Freq: Every morning | ORAL | 0 refills | 30.00000 days | Status: CP
Start: 2023-09-24 — End: 2023-10-23

## 2023-09-24 MED ORDER — DEXTROAMPHETAMINE-AMPHETAMINE 20 MG TABLET
ORAL_TABLET | Freq: Every day | ORAL | 0 refills | 30.00000 days | Status: CP
Start: 2023-09-24 — End: 2023-10-24

## 2023-09-24 MED ORDER — FLUOXETINE 40 MG CAPSULE
ORAL_CAPSULE | Freq: Every day | ORAL | 0 refills | 90.00000 days | Status: CP
Start: 2023-09-24 — End: 2023-12-23
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
## 2023-09-24 NOTE — Unmapped (Signed)
 11/4 1pm IN PERSON

## 2023-10-15 NOTE — Unmapped (Signed)
 Outreach Call: Clinician attempted to speak with patient about transition and did not leave a voicemail.   Clinician sent patient a MyChart message.

## 2023-10-15 NOTE — Unmapped (Signed)
 The [facility] has made a second and final attempt to reach this patient to refill the following medication:Xolairs .      We have left voicemails on the following phone numbers: [phone], have sent a MyChart message, have sent a text message to the following phone numbers: [phone], and have sent a Mychart questionnaire..    Dates contacted: 08/28 09/02  Last scheduled delivery: 08/04    The patient may be at risk of non-compliance with this medication. The patient should call the [facility] at [phone]  Option 4, then Option 3: Allergy , Immunology, Pulmonary, Neurology to refill medication.    Seymone Forlenza   [place] [facility]

## 2023-10-21 MED FILL — XOLAIR 75 MG/0.5 ML SUBCUTANEOUS SYRINGE: SUBCUTANEOUS | 28 days supply | Qty: 1 | Fill #4

## 2023-10-21 MED FILL — XOLAIR 150 MG/ML SUBCUTANEOUS SYRINGE: SUBCUTANEOUS | 28 days supply | Qty: 2 | Fill #4

## 2023-10-21 NOTE — Unmapped (Signed)
 [facility] Refill Coordination Note    Jamie Webb, DOB: 10-04-1990  Phone: [phone] (home)       All above HIPAA information was verified with patient.         10/17/2023     1:40 PM   Specialty Rx Medication Refill Questionnaire   Which Medications would you like refilled and shipped? Xolair    Please list all current allergies: None   Have you missed any doses in the last 30 days? No   Have you had any changes to your medication(s) since your last refill? No   How much of each medication do you have remaining at home? (eg. number of tablets, injections, etc.) 0   If receiving an injectable medication, next injection date is 10/17/2023   Have you experienced any side effects in the last 30 days? No   Please enter the full address (street address, city, state, zip code) where you would like your medication(s) to be delivered to. [address] [place], [place] [address]   Please specify on which day you would like your medication(s) to arrive. Note: if you need your medication(s) within 3 days, please call the pharmacy to schedule your order at [phone]  10/17/2023   Has your insurance changed since your last refill? No   Would you like a pharmacist to call you to discuss your medication(s)? No   Do you require a signature for your package? (Note: if we are billing Medicare Part B or your order contains a controlled substance, we will require a signature) No   I have been provided my out of pocket cost for my medication and approve the pharmacy to charge the amount to my credit card on file. Yes         Completed refill call assessment today to schedule patient's medication shipment from the [facility] [phone]).  All relevant notes have been reviewed.       Confirmed patient received a Conservation officer, historic buildings and a Surveyor, mining with first shipment. The patient will receive a drug information handout for each medication shipped and additional FDA Medication Guides as required.         REFERRAL TO PHARMACIST     Referral to the pharmacist: Not needed      [facility]     Shipping address confirmed in Epic.     Delivery Scheduled: Yes, Expected medication delivery date: 10/21/2023.     Medication will be delivered via Same Day Courier to the prescription address in Epic WAM.    Jamie Webb   [facility] Specialty Technician

## 2023-10-24 MED ORDER — LISDEXAMFETAMINE 60 MG CAPSULE
ORAL_CAPSULE | Freq: Every morning | ORAL | 0 refills | 30.00000 days | Status: CP
Start: 2023-10-24 — End: 2023-11-22

## 2023-10-24 MED ORDER — DEXTROAMPHETAMINE-AMPHETAMINE 20 MG TABLET
ORAL_TABLET | Freq: Every day | ORAL | 0 refills | 30.00000 days | Status: CP
Start: 2023-10-24 — End: 2023-11-23

## 2023-11-18 DIAGNOSIS — J4551 Severe persistent asthma with (acute) exacerbation: Principal | ICD-10-CM

## 2023-11-18 MED ORDER — XOLAIR 75 MG/0.5 ML SUBCUTANEOUS SYRINGE
SUBCUTANEOUS | 4 refills | 28.00000 days | Status: CP
Start: 2023-11-18 — End: ?
  Filled 2023-11-19: qty 1, 28d supply, fill #0

## 2023-11-18 MED ORDER — XOLAIR 150 MG/ML SUBCUTANEOUS SYRINGE
SUBCUTANEOUS | 4 refills | 28.00000 days | Status: CP
Start: 2023-11-18 — End: ?
  Filled 2023-11-19: qty 2, 28d supply, fill #0

## 2023-11-18 NOTE — Unmapped (Signed)
 Staff spoke with patient about scheduling a therapy visit at [facility] with [facility], LCSW.  Patient needs to check her schedule - will send MyChart message to coordinate appointment day/time.

## 2023-11-18 NOTE — Unmapped (Signed)
 [facility] Delivery Pharm[facility] Refill Coordination Note    Specialty Medication(s) to be Shipped:   CF/Pulmonary/Asthma: Xolair     Other medication(s) to be shipped: No additional medications requested for fill at this time    Specialty Medications not needed at this time: N/A     Jamie Webb, DOB: [birth year]  Phone: [phone] (home)       All above HIPAA information was verified with patient.     Was a Nurse, learning disability used for this call? No    Completed refill call assessment today to schedule patient's medication shipment from the [facility]  [phone]).  All relevant notes have been reviewed.     Specialty medication(s) and dose(s) confirmed: Regimen is correct and unchanged.   Changes to medications: [name] reports no changes at this time.  Changes to insurance: No  New side effects reported not previously addressed with a pharmacist or physician: None reported  Questions for the pharmacist: No    Confirmed patient received a Conservation officer, historic buildings and a Surveyor, mining with first shipment. The patient will receive a drug information handout for each medication shipped and additional FDA Medication Guides as required.       DISEASE/MEDICATION-SPECIFIC INFORMATION        For patients on injectable medications: Next injection is scheduled for 10/06.    SPECIALTY MEDICATION ADHERENCE     Medication Adherence    Patient reported X missed doses in the last month: 0  Specialty Medication: XOLAIR  75 mg/0.5 mL syringe (omalizumab )  Patient is on additional specialty medications: Yes  Additional Specialty Medications: XOLAIR  150 mg/mL syringe (omalizumab )  Patient Reported Additional Medication X Missed Doses in the Last Month: 0  Patient is on more than two specialty medications: No              Were doses missed due to medication being on hold? No    XOLAIR  150 mg/mL syringe (omalizumab ) : 0 doses of medicine on hand   XOLAIR  75 mg/0.5 mL syringe (omalizumab ) : 0 doses of medicine on hand       REFERRAL TO PHARMACIST     Referral to the pharmacist: Not needed      SHIPPING     Shipping address confirmed in Epic.     Cost and Payment: Patient has a copay of $8. They are aware and have authorized the pharmacy to charge the credit card on file.    Delivery Scheduled: Yes, Expected medication delivery date: 10/07.  However, Rx request for refills was sent to the provider as there are none remaining.     Medication will be delivered via Same Day Courier to the prescription address in Epic WAM.    Kani Chauvin   [facility]  Specialty Technician

## 2023-11-22 MED ORDER — GABAPENTIN 300 MG CAPSULE
ORAL_CAPSULE | Freq: Every evening | ORAL | 0 refills | 90.00000 days | Status: CP | PRN
Start: 2023-11-22 — End: 2024-02-20

## 2023-11-22 NOTE — Unmapped (Signed)
 Pharmacy contacted the clinic on behalf of the patient. Pharmacy verified. Jamie Webb

## 2023-11-23 MED ORDER — DEXTROAMPHETAMINE-AMPHETAMINE 20 MG TABLET
ORAL_TABLET | Freq: Every day | ORAL | 0 refills | 30.00000 days | Status: CP
Start: 2023-11-23 — End: 2023-12-23

## 2023-11-23 MED ORDER — LISDEXAMFETAMINE 60 MG CAPSULE
ORAL_CAPSULE | Freq: Every morning | ORAL | 0 refills | 30.00000 days | Status: CP
Start: 2023-11-23 — End: 2023-12-22

## 2023-12-04 MED ORDER — HYDROXYZINE HCL 10 MG TABLET
ORAL_TABLET | Freq: Every day | ORAL | 0 refills | 90.00000 days | Status: CP | PRN
Start: 2023-12-04 — End: ?

## 2023-12-13 NOTE — Progress Notes (Signed)
 [facility] Delivery Pharm[facility] Refill Coordination Note    Jamie Webb, DOB: 1990-09-11  Phone: [phone] (home)       All above HIPAA information was verified with patient.         12/12/2023     7:37 PM   Specialty Rx Medication Refill Questionnaire   Which Medications would you like refilled and shipped? Xolair    Please list all current allergies: None   Have you missed any doses in the last 30 days? No   Have you had any changes to your medication(s) since your last refill? No   How much of each medication do you have remaining at home? (eg. number of tablets, injections, etc.) 0   If receiving an injectable medication, next injection date is 12/16/2023   Have you experienced any side effects in the last 30 days? No   Please enter the full address (street address, city, state, zip code) where you would like your medication(s) to be delivered to. [address], [place], [place] [address]   Please specify on which day you would like your medication(s) to arrive. Note: if you need your medication(s) within 3 days, please call the pharmacy to schedule your order at [phone]  12/16/2023   Has your insurance changed since your last refill? No   Would you like a pharmacist to call you to discuss your medication(s)? No   Do you require a signature for your package? (Note: if we are billing Medicare Part B or your order contains a controlled substance, we will require a signature) No   I have been provided my out of pocket cost for my medication and approve the pharmacy to charge the amount to my credit card on file. Yes         Completed refill call assessment today to schedule patient's medication shipment from the [facility] [phone]).  All relevant notes have been reviewed.       Confirmed patient received a Conservation Officer, Historic Buildings and a Surveyor, Mining with first shipment. The patient will receive a drug information handout for each medication shipped and additional FDA Medication Guides as required.         REFERRAL TO PHARMACIST     Referral to the pharmacist: Not needed      [facility]     Shipping address confirmed in Epic.     Delivery Scheduled: Yes, Expected medication delivery date: 12/16/23.     Medication will be delivered via Same Day Courier to the prescription address in Epic WAM.    Shelba DELENA Hummer, PharmD   [facility] Specialty Pharmacist

## 2023-12-16 MED FILL — XOLAIR 150 MG/ML SUBCUTANEOUS SYRINGE: SUBCUTANEOUS | 28 days supply | Qty: 2 | Fill #1

## 2023-12-16 MED FILL — XOLAIR 75 MG/0.5 ML SUBCUTANEOUS SYRINGE: SUBCUTANEOUS | 28 days supply | Qty: 1 | Fill #1

## 2023-12-17 DIAGNOSIS — F909 Attention-deficit hyperactivity disorder, unspecified type: Principal | ICD-10-CM

## 2023-12-17 DIAGNOSIS — F32A Anxiety and depression: Principal | ICD-10-CM

## 2023-12-17 DIAGNOSIS — F419 Anxiety disorder, unspecified: Principal | ICD-10-CM

## 2023-12-17 MED ORDER — FLUOXETINE 20 MG CAPSULE
ORAL_CAPSULE | Freq: Every day | ORAL | 0 refills | 90.00000 days | Status: CP
Start: 2023-12-17 — End: 2024-03-16

## 2023-12-17 MED ORDER — LISDEXAMFETAMINE 60 MG CAPSULE
ORAL_CAPSULE | Freq: Every morning | ORAL | 0 refills | 30.00000 days | Status: CP
Start: 2023-12-17 — End: 2024-01-15

## 2023-12-17 MED ORDER — FLUOXETINE 40 MG CAPSULE
ORAL_CAPSULE | Freq: Every day | ORAL | 0 refills | 90.00000 days | Status: CP
Start: 2023-12-17 — End: 2024-03-16

## 2023-12-17 MED ORDER — GABAPENTIN 100 MG CAPSULE
ORAL_CAPSULE | Freq: Every evening | ORAL | 0 refills | 45.00000 days | Status: CP | PRN
Start: 2023-12-17 — End: 2024-06-14

## 2023-12-17 MED ORDER — DEXTROAMPHETAMINE-AMPHETAMINE 20 MG TABLET
ORAL_TABLET | Freq: Every day | ORAL | 0 refills | 30.00000 days | Status: CP
Start: 2023-12-17 — End: 2024-01-16
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
## 2024-01-06 NOTE — Progress Notes (Signed)
 The [facility] has made a second and final attempt to reach this patient to refill the following medication:XOLAIRS.      We have left voicemails on the following phone numbers: Phone: [phone], have sent a MyChart message, have sent a text message to the following phone numbers: Phone: [phone], and have sent a Mychart questionnaire..    Dates contacted: 11/21 11/24  Last scheduled delivery: 11/03    The patient may be at risk of non-compliance with this medication. The patient should call the [facility] at [phone]  Option 4, then Option 3: Allergy , Immunology, Pulmonary, Neurology to refill medication.    Kynley Metzger   [place] [facility]

## 2024-01-14 NOTE — Progress Notes (Signed)
 01/14/2024 - Patient originally requested delivery for 11/28. Delivery is not possible on this date due to closed. I have reached out to the patient and confirmed that delivery on 12/03 is ok.

## 2024-01-14 NOTE — Progress Notes (Signed)
 [facility] and Home Delivery Pharmacy Refill Coordination Note    Jamie Webb, DOB: 08-Nov-1990  Phone: [phone] (home)       All above HIPAA information was verified with patient.         01/06/2024    12:18 PM   Specialty Rx Medication Refill Questionnaire   Which Medications would you like refilled and shipped? XOLAIR    Please list all current allergies: None   Have you missed any doses in the last 30 days? No   Have you had any changes to your medication(s) since your last refill? No   How much of each medication do you have remaining at home? (eg. number of tablets, injections, etc.) 0   If receiving an injectable medication, next injection date is 01/13/2024   Have you experienced any side effects in the last 30 days? No   Please enter the full address (street address, city, state, zip code) where you would like your medication(s) to be delivered to. [address] , [place], [place] [address]   Please specify on which day you would like your medication(s) to arrive. Note: if you need your medication(s) within 3 days, please call the pharmacy to schedule your order at [phone]  01/10/2024   Has your insurance changed since your last refill? No   Would you like a pharmacist to call you to discuss your medication(s)? No   Do you require a signature for your package? (Note: if we are billing Medicare Part B or your order contains a controlled substance, we will require a signature) No   I have been provided my out of pocket cost for my medication and approve the pharmacy to charge the amount to my credit card on file. Yes         Completed refill call assessment today to schedule patient's medication shipment from the [facility] [phone]).  All relevant notes have been reviewed.       Confirmed patient received a Conservation Officer, Historic Buildings and a Surveyor, Mining with first shipment. The patient will receive a drug information handout for each medication shipped and additional FDA Medication Guides as required.         REFERRAL TO PHARMACIST     Referral to the pharmacist: Not needed      [facility]     Shipping address confirmed in Epic.     Delivery Scheduled: Yes, Expected medication delivery date: 12/03.     Medication will be delivered via Same Day Courier to the prescription address in Epic WAM.    Jamie Webb   [place] [facility] Specialty Technician

## 2024-01-15 MED FILL — XOLAIR 75 MG/0.5 ML SUBCUTANEOUS SYRINGE: SUBCUTANEOUS | 28 days supply | Qty: 1 | Fill #2

## 2024-01-15 MED FILL — XOLAIR 150 MG/ML SUBCUTANEOUS SYRINGE: SUBCUTANEOUS | 28 days supply | Qty: 2 | Fill #2

## 2024-01-16 MED ORDER — DEXTROAMPHETAMINE-AMPHETAMINE 20 MG TABLET
ORAL_TABLET | Freq: Every day | ORAL | 0 refills | 30.00000 days | Status: CP
Start: 2024-01-16 — End: 2024-02-15

## 2024-01-16 MED ORDER — LISDEXAMFETAMINE 60 MG CAPSULE
ORAL_CAPSULE | Freq: Every morning | ORAL | 0 refills | 30.00000 days | Status: CP
Start: 2024-01-16 — End: 2024-02-14

## 2024-01-28 DIAGNOSIS — F429 Obsessive-compulsive disorder, unspecified: Principal | ICD-10-CM

## 2024-01-28 DIAGNOSIS — F909 Attention-deficit hyperactivity disorder, unspecified type: Principal | ICD-10-CM

## 2024-01-28 DIAGNOSIS — F32A Anxiety and depression: Principal | ICD-10-CM

## 2024-01-28 DIAGNOSIS — F419 Anxiety disorder, unspecified: Principal | ICD-10-CM
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
## 2024-02-10 NOTE — Progress Notes (Addendum)
 02/10/2024 - Patient originally requested delivery for 02/10/24 . Delivery is not possible on this date due to no  same  day  order . I have reached out to the patient and confirmed that delivery on 02/12/24  is ok. [facility] Delivery Pharm[facility] Refill Coordination Note    Jamie Webb, DOB: [birth year]  Phone: [phone] (home)       All above HIPAA information was verified with patient.         02/07/2024     5:29 PM   Specialty Rx Medication Refill Questionnaire   Which Medications would you like refilled and shipped? Xolair    Please list all current allergies: None   Have you missed any doses in the last 30 days? No   Have you had any changes to your medication(s) since your last refill? No   How much of each medication do you have remaining at home? (eg. number of tablets, injections, etc.) 0   If receiving an injectable medication, next injection date is 02/10/2024   Have you experienced any side effects in the last 30 days? No   Please enter the full address (street address, city, state, zip code) where you would like your medication(s) to be delivered to. [address], [place], [place] [address]   Please specify on which day you would like your medication(s) to arrive. Note: if you need your medication(s) within 3 days, please call the pharmacy to schedule your order at [phone]  02/10/2024   Has your insurance changed since your last refill? No   Would you like a pharmacist to call you to discuss your medication(s)? No   Do you require a signature for your package? (Note: if we are billing Medicare Part B or your order contains a controlled substance, we will require a signature) No   I have been provided my out of pocket cost for my medication and approve the pharmacy to charge the amount to my credit card on file. Yes         Completed refill call assessment today to schedule patient's medication shipment from the [facility] [phone]).  All relevant notes have been reviewed.       Confirmed patient received a Conservation Officer, Historic Buildings and a Surveyor, Mining with first shipment. The patient will receive a drug information handout for each medication shipped and additional FDA Medication Guides as required.         REFERRAL TO PHARMACIST     Referral to the pharmacist: Not needed      [facility]     Shipping address confirmed in Epic.     Delivery Scheduled: Yes, Expected medication delivery date: 02/12/24 .     Medication will be delivered via Next Day Courier to the prescription address in Epic WAM.    Jamie Webb   [facility] Specialty Technician

## 2024-02-11 MED FILL — XOLAIR 75 MG/0.5 ML SUBCUTANEOUS SYRINGE: SUBCUTANEOUS | 28 days supply | Qty: 1 | Fill #3

## 2024-02-11 MED FILL — XOLAIR 150 MG/ML SUBCUTANEOUS SYRINGE: SUBCUTANEOUS | 28 days supply | Qty: 2 | Fill #3

## 2024-02-15 MED ORDER — LISDEXAMFETAMINE 60 MG CAPSULE
ORAL_CAPSULE | Freq: Every morning | ORAL | 0 refills | 30.00000 days | Status: CP
Start: 2024-02-15 — End: 2024-03-15

## 2024-02-15 MED ORDER — DEXTROAMPHETAMINE-AMPHETAMINE 20 MG TABLET
ORAL_TABLET | Freq: Every day | ORAL | 0 refills | 30.00000 days | Status: CP
Start: 2024-02-15 — End: 2024-03-16

## 2024-02-25 NOTE — Telephone Encounter (Signed)
 Staff removing patient from therapy list as patient has not responded to multiple outreach attempts to schedule a therapy visit with this provider.  Patient informed to talk with psychiatric provider if she wishes to go back on the wait list for a therapy provider at [facility] at some point in the future.

## 2024-03-13 DIAGNOSIS — F909 Attention-deficit hyperactivity disorder, unspecified type: Principal | ICD-10-CM

## 2024-03-13 MED ORDER — FLUOXETINE 20 MG CAPSULE
ORAL_CAPSULE | Freq: Every day | ORAL | 0 refills | 0.00000 days
Start: 2024-03-13 — End: ?

## 2024-03-16 MED ORDER — FLUOXETINE 20 MG CAPSULE
ORAL_CAPSULE | Freq: Every day | ORAL | 0 refills | 90.00000 days | Status: CP
Start: 2024-03-16 — End: 2024-06-14

## 2024-03-16 NOTE — Progress Notes (Signed)
 [facility] Pharm[facility] Refill Coordination Note    Specialty Medication(s) to be Shipped:   CF/Pulmonary/Asthma: Xolair     Other medication(s) to be shipped: No additional medications requested for fill at this time    Specialty Medications not needed at this time: N/A     Jamie Webb, DOB: October 06, 1990  Phone: [phone] (home)       All above HIPAA information was verified with patient.     Was a nurse, learning disability used for this call? No    Completed refill call assessment today to schedule patient's medication shipment from the [facility]  [phone]).  All relevant notes have been reviewed.     Specialty medication(s) and dose(s) confirmed: Regimen is correct and unchanged.   Changes to medications: Jamie Webb reports no changes at this time.  Changes to insurance: No  New side effects reported not previously addressed with a pharmacist or physician: None reported  Questions for the pharmacist: No    Confirmed patient received a Conservation Officer, Historic Buildings and a Surveyor, Mining with first shipment. The patient will receive a drug information handout for each medication shipped and additional FDA Medication Guides as required.       DISEASE/MEDICATION-SPECIFIC INFORMATION        N/A    SPECIALTY MEDICATION ADHERENCE     Medication Adherence    Patient reported X missed doses in the last month: 1  Specialty Medication: XOLAIR  150 mg/mL syringe (omalizumab )  Patient is on additional specialty medications: Yes  Additional Specialty Medications: XOLAIR  75 mg/0.5 mL syringe (omalizumab )  Patient Reported Additional Medication X Missed Doses in the Last Month: 1  Patient is on more than two specialty medications: No  Any gaps in refill history greater than 2 weeks in the last 3 months: no  Demonstrates understanding of importance of adherence: yes              Were doses missed due to medication being on hold? No    XOLAIR  150 mg/ml: 0 days of medicine on hand   XOLAIR  75 mg/ml: 0 days of medicine on hand       Specialty medication is an injection or given on a cycle: No    REFERRAL TO PHARMACIST     Referral to the pharmacist: Not needed      [facility]     Shipping address confirmed in Epic.     Cost and Payment: Patient has a copay of $8.00. They are aware and have authorized the pharmacy to charge the credit card on file.    Delivery Scheduled: Yes, Expected medication delivery date: 03/18/24.     Medication will be delivered via Next Day Courier to the prescription address in Epic WAM.    Jamie Webb   [facility]  Specialty Technician

## 2024-03-17 MED FILL — XOLAIR 75 MG/0.5 ML SUBCUTANEOUS SYRINGE: SUBCUTANEOUS | 28 days supply | Qty: 1 | Fill #4

## 2024-03-17 MED FILL — XOLAIR 150 MG/ML SUBCUTANEOUS SYRINGE: SUBCUTANEOUS | 28 days supply | Qty: 2 | Fill #4

## 2024-03-19 DIAGNOSIS — J4551 Severe persistent asthma with (acute) exacerbation: Principal | ICD-10-CM

## 2024-03-19 MED ORDER — XOLAIR 150 MG/ML SUBCUTANEOUS SYRINGE
SUBCUTANEOUS | 4 refills | 28.00000 days
Start: 2024-03-19 — End: ?

## 2024-03-19 MED ORDER — XOLAIR 75 MG/0.5 ML SUBCUTANEOUS SYRINGE
SUBCUTANEOUS | 4 refills | 28.00000 days
Start: 2024-03-19 — End: ?
